# Patient Record
Sex: Female | Born: 1955 | Race: Black or African American | Hispanic: No | Marital: Married | Smoking: Former smoker
Health system: Southern US, Community
[De-identification: ages and names within clinical notes are randomized; demographics above are authoritative.]

## PROBLEM LIST (undated history)

## (undated) DIAGNOSIS — B9681 Helicobacter pylori [H. pylori] as the cause of diseases classified elsewhere: Secondary | ICD-10-CM

## (undated) DIAGNOSIS — E669 Obesity, unspecified: Secondary | ICD-10-CM

## (undated) DIAGNOSIS — D219 Benign neoplasm of connective and other soft tissue, unspecified: Secondary | ICD-10-CM

## (undated) DIAGNOSIS — K5792 Diverticulitis of intestine, part unspecified, without perforation or abscess without bleeding: Secondary | ICD-10-CM

## (undated) DIAGNOSIS — K760 Fatty (change of) liver, not elsewhere classified: Secondary | ICD-10-CM

## (undated) DIAGNOSIS — M069 Rheumatoid arthritis, unspecified: Secondary | ICD-10-CM

## (undated) DIAGNOSIS — E559 Vitamin D deficiency, unspecified: Secondary | ICD-10-CM

## (undated) DIAGNOSIS — I10 Essential (primary) hypertension: Secondary | ICD-10-CM

## (undated) DIAGNOSIS — K589 Irritable bowel syndrome without diarrhea: Secondary | ICD-10-CM

## (undated) DIAGNOSIS — M1990 Unspecified osteoarthritis, unspecified site: Secondary | ICD-10-CM

## (undated) DIAGNOSIS — M4800 Spinal stenosis, site unspecified: Secondary | ICD-10-CM

## (undated) DIAGNOSIS — K219 Gastro-esophageal reflux disease without esophagitis: Secondary | ICD-10-CM

## (undated) DIAGNOSIS — M5136 Other intervertebral disc degeneration, lumbar region: Secondary | ICD-10-CM

## (undated) DIAGNOSIS — G4730 Sleep apnea, unspecified: Secondary | ICD-10-CM

## (undated) DIAGNOSIS — M109 Gout, unspecified: Secondary | ICD-10-CM

## (undated) DIAGNOSIS — E7800 Pure hypercholesterolemia, unspecified: Secondary | ICD-10-CM

## (undated) DIAGNOSIS — D509 Iron deficiency anemia, unspecified: Secondary | ICD-10-CM

## (undated) DIAGNOSIS — F32A Depression, unspecified: Secondary | ICD-10-CM

## (undated) DIAGNOSIS — M51369 Other intervertebral disc degeneration, lumbar region without mention of lumbar back pain or lower extremity pain: Secondary | ICD-10-CM

## (undated) DIAGNOSIS — K649 Unspecified hemorrhoids: Secondary | ICD-10-CM

## (undated) DIAGNOSIS — K279 Peptic ulcer, site unspecified, unspecified as acute or chronic, without hemorrhage or perforation: Secondary | ICD-10-CM

## (undated) DIAGNOSIS — E119 Type 2 diabetes mellitus without complications: Secondary | ICD-10-CM

## (undated) DIAGNOSIS — F329 Major depressive disorder, single episode, unspecified: Secondary | ICD-10-CM

## (undated) HISTORY — DX: Diverticulitis of intestine, part unspecified, without perforation or abscess without bleeding: K57.92

## (undated) HISTORY — DX: Essential (primary) hypertension: I10

## (undated) HISTORY — DX: Gout, unspecified: M10.9

## (undated) HISTORY — DX: Unspecified hemorrhoids: K64.9

## (undated) HISTORY — DX: Depression, unspecified: F32.A

## (undated) HISTORY — PX: SPINE SURGERY: SHX786

## (undated) HISTORY — DX: Spinal stenosis, site unspecified: M48.00

## (undated) HISTORY — DX: Major depressive disorder, single episode, unspecified: F32.9

## (undated) HISTORY — DX: Gastro-esophageal reflux disease without esophagitis: K21.9

## (undated) HISTORY — DX: Pure hypercholesterolemia, unspecified: E78.00

## (undated) HISTORY — DX: Type 2 diabetes mellitus without complications: E11.9

## (undated) HISTORY — DX: Irritable bowel syndrome, unspecified: K58.9

## (undated) HISTORY — DX: Fatty (change of) liver, not elsewhere classified: K76.0

## (undated) HISTORY — PX: BACK SURGERY: SHX140

## (undated) HISTORY — PX: FOOT SURGERY: SHX648

## (undated) HISTORY — PX: SHOULDER SURGERY: SHX246

## (undated) HISTORY — PX: ABDOMINAL HYSTERECTOMY: SHX81

## (undated) HISTORY — DX: Other intervertebral disc degeneration, lumbar region without mention of lumbar back pain or lower extremity pain: M51.369

## (undated) HISTORY — DX: Other intervertebral disc degeneration, lumbar region: M51.36

## (undated) HISTORY — PX: OTHER SURGICAL HISTORY: SHX169

## (undated) HISTORY — DX: Benign neoplasm of connective and other soft tissue, unspecified: D21.9

## (undated) HISTORY — PX: NECK SURGERY: SHX720

## (undated) HISTORY — PX: ECTOPIC PREGNANCY SURGERY: SHX613

## (undated) HISTORY — PX: CHOLECYSTECTOMY: SHX55

## (undated) HISTORY — PX: ROTATOR CUFF REPAIR: SHX139

---

## 2003-03-22 ENCOUNTER — Other Ambulatory Visit: Payer: Self-pay

## 2003-06-05 ENCOUNTER — Ambulatory Visit (HOSPITAL_COMMUNITY): Admission: RE | Admit: 2003-06-05 | Discharge: 2003-06-06 | Payer: Self-pay | Admitting: Neurosurgery

## 2003-07-01 ENCOUNTER — Encounter: Admission: RE | Admit: 2003-07-01 | Discharge: 2003-07-01 | Payer: Self-pay | Admitting: Neurosurgery

## 2004-02-15 ENCOUNTER — Emergency Department: Payer: Self-pay | Admitting: Internal Medicine

## 2004-02-22 ENCOUNTER — Ambulatory Visit: Payer: Self-pay | Admitting: Pain Medicine

## 2004-05-25 ENCOUNTER — Ambulatory Visit: Payer: Self-pay | Admitting: Otolaryngology

## 2004-06-02 ENCOUNTER — Emergency Department: Payer: Self-pay | Admitting: Unknown Physician Specialty

## 2004-08-19 ENCOUNTER — Ambulatory Visit: Payer: Self-pay

## 2004-09-05 ENCOUNTER — Ambulatory Visit: Payer: Self-pay

## 2004-12-09 ENCOUNTER — Ambulatory Visit: Payer: Self-pay | Admitting: Internal Medicine

## 2005-03-14 ENCOUNTER — Emergency Department: Payer: Self-pay | Admitting: Emergency Medicine

## 2005-04-13 ENCOUNTER — Ambulatory Visit: Payer: Self-pay

## 2005-04-23 ENCOUNTER — Other Ambulatory Visit: Payer: Self-pay

## 2005-04-23 ENCOUNTER — Emergency Department: Payer: Self-pay | Admitting: General Practice

## 2005-04-24 ENCOUNTER — Ambulatory Visit: Payer: Self-pay | Admitting: General Practice

## 2005-04-25 ENCOUNTER — Inpatient Hospital Stay: Payer: Self-pay | Admitting: Unknown Physician Specialty

## 2005-07-14 ENCOUNTER — Ambulatory Visit: Payer: Self-pay | Admitting: Unknown Physician Specialty

## 2005-10-16 ENCOUNTER — Other Ambulatory Visit: Payer: Self-pay

## 2005-10-16 ENCOUNTER — Emergency Department: Payer: Self-pay | Admitting: Unknown Physician Specialty

## 2005-10-27 ENCOUNTER — Ambulatory Visit: Payer: Self-pay | Admitting: Unknown Physician Specialty

## 2005-11-08 ENCOUNTER — Encounter: Payer: Self-pay | Admitting: Specialist

## 2005-11-12 ENCOUNTER — Encounter: Payer: Self-pay | Admitting: Specialist

## 2005-12-13 ENCOUNTER — Encounter: Payer: Self-pay | Admitting: Specialist

## 2006-03-19 ENCOUNTER — Emergency Department: Payer: Self-pay | Admitting: General Practice

## 2006-03-26 ENCOUNTER — Ambulatory Visit: Payer: Self-pay | Admitting: Internal Medicine

## 2006-03-30 ENCOUNTER — Ambulatory Visit: Payer: Self-pay | Admitting: Internal Medicine

## 2006-04-02 ENCOUNTER — Ambulatory Visit: Payer: Self-pay | Admitting: Internal Medicine

## 2006-04-05 ENCOUNTER — Emergency Department: Payer: Self-pay | Admitting: Emergency Medicine

## 2006-04-26 ENCOUNTER — Ambulatory Visit: Payer: Self-pay

## 2006-06-25 ENCOUNTER — Ambulatory Visit: Payer: Self-pay | Admitting: Specialist

## 2006-09-05 ENCOUNTER — Ambulatory Visit: Payer: Self-pay | Admitting: Internal Medicine

## 2006-10-04 ENCOUNTER — Emergency Department: Payer: Self-pay | Admitting: Emergency Medicine

## 2006-11-20 ENCOUNTER — Ambulatory Visit: Payer: Self-pay | Admitting: Pain Medicine

## 2006-11-26 ENCOUNTER — Ambulatory Visit: Payer: Self-pay | Admitting: Pain Medicine

## 2006-12-06 ENCOUNTER — Ambulatory Visit: Payer: Self-pay | Admitting: Specialist

## 2006-12-31 ENCOUNTER — Encounter: Admission: RE | Admit: 2006-12-31 | Discharge: 2006-12-31 | Payer: Self-pay | Admitting: Neurosurgery

## 2007-01-11 ENCOUNTER — Ambulatory Visit: Payer: Self-pay | Admitting: Specialist

## 2007-01-17 ENCOUNTER — Ambulatory Visit: Payer: Self-pay | Admitting: Specialist

## 2007-02-12 ENCOUNTER — Encounter: Payer: Self-pay | Admitting: Specialist

## 2007-02-13 ENCOUNTER — Encounter: Payer: Self-pay | Admitting: Specialist

## 2007-03-13 ENCOUNTER — Other Ambulatory Visit: Payer: Self-pay

## 2007-03-13 ENCOUNTER — Emergency Department: Payer: Self-pay

## 2007-03-16 ENCOUNTER — Encounter: Payer: Self-pay | Admitting: Specialist

## 2007-03-21 ENCOUNTER — Ambulatory Visit: Payer: Self-pay | Admitting: Orthopedic Surgery

## 2007-04-15 ENCOUNTER — Encounter: Payer: Self-pay | Admitting: Specialist

## 2007-07-12 ENCOUNTER — Ambulatory Visit: Payer: Self-pay | Admitting: Unknown Physician Specialty

## 2009-06-25 ENCOUNTER — Encounter: Payer: Self-pay | Admitting: Orthopedic Surgery

## 2009-07-09 ENCOUNTER — Ambulatory Visit: Payer: Self-pay | Admitting: Family Medicine

## 2009-07-13 ENCOUNTER — Encounter: Payer: Self-pay | Admitting: Orthopedic Surgery

## 2009-08-13 ENCOUNTER — Encounter: Payer: Self-pay | Admitting: Orthopedic Surgery

## 2010-01-13 ENCOUNTER — Ambulatory Visit: Payer: Self-pay | Admitting: Specialist

## 2010-01-27 ENCOUNTER — Ambulatory Visit: Payer: Self-pay | Admitting: Specialist

## 2010-02-01 ENCOUNTER — Ambulatory Visit: Payer: Self-pay | Admitting: Specialist

## 2010-08-30 ENCOUNTER — Emergency Department: Payer: Self-pay | Admitting: Emergency Medicine

## 2010-09-02 ENCOUNTER — Other Ambulatory Visit: Payer: Self-pay | Admitting: Unknown Physician Specialty

## 2010-09-30 NOTE — Op Note (Signed)
Charlotte [name], Charlotte [name]                            ACCOUNT NO.:  [id number]   MEDICAL RECORD NO.:  [id number]                   PATIENT TYPE:  OIB   LOCATION:  3172                                 FACILITY:  [facility]   PHYSICIAN:  Reinaldo Meeker, M.D.              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   POSTOPERATIVE DIAGNOSIS:  Herniated disk C5-6, C6-7 with spinal cord  compression.   PROCEDURE:  C5-6 and C6-7 anterior cervical diskectomy with bone-bank fusion  followed by Vision anterior cervical plating with the operating microscope.   SURGEON:  Reinaldo Meeker, M.D.   ASSISTANT:  Donalee Citrin, M.D.   DESCRIPTION OF PROCEDURE:  After being placed in the supine position in 10  pounds of traction, the patient's neck was prepped and draped in the usual  sterile fashion.  Localizing fluoroscopy was used prior to the incision to  identify the appropriate level.  A transverse incision was made in the right  anterior neck across the midline headed towards the medial aspect of the  sternocleidomastoid muscle.  The platysma muscle was then incised  transversely.  The natural fascial plane between the strap muscles medially  and sternocleidomastoid laterally, was identified and followed down to the  anterior cervical spine.  Longus colli muscles were identified and split in  the midline and resected bilaterally with the Kitner resector, Bovie  coagulating current, and Key elevator.  Self-retaining retractor was placed  for exposure and second fluoroscopy showed approach to the appropriate  level.  Using the 15 blade, the disk at C5-6 and C6-7 was incised.  Using  pituitary rongeurs and curets approximately 90% of the disk material was  removed.  A high-speed drill was used to widen the interspace at both  levels.  The  microscope was draped and brought into the field and used the  remainder of the case.  We started at C6-7.  A high-speed drill was used to  further widen the interspace and drill down deep into the disk space.  When  the posterior longitudinal ligament was identified, it was removed in a  piecemeal fashion and thorough spinal cord decompression was carried out  along with removal of the deep spurs off C6 and C7.  Proximal foraminal  decompression was carried out bilaterally particularly on the left, more  symptomatic side.  Attention was then turned to C5-6 where a similar  procedure was carried out.  Once again, the high-speed drill was used to  widen the interspace down to the deep regions and the __________ was then  incised transversely and the cut ends removed.  A large amount of herniated  disk material was removed to decompress the underlying spinal dura.  Once  again,  thorough decompression was carried out bilaterally until the proximal  nerve roots could be identified bilaterally.  At this point inspection was  carried out in all directions at both levels for any evidence of residual  compression.  None could be identified.  Large amounts of irrigation were  carried out.  Measurements were taken and two 7-mm plugs were reconstituted.  After irrigating once more and confirming hemostasis, plugs were packed  without difficulty.  Fluoroscopy showed the plugs to be in good position.  An appropriate length Vision anterior cervical plate was then chosen.  Under  fluoroscopic guidance drill holes were placed followed by placement of 14-mm  screws times six.  Locking mechanism was then secured and fluoroscopy showed  the plate plug and screws to all be in good position.  At this time large  amounts of irrigation were carried out.  Any bleeding was controlled with  bipolar coagulation.  The wound was then closed using  interrupted Vicryl on the platysma muscle and inverted 5-0 PDS in the   subcuticular layer and Steri-Strips on the skin.  Sterile dressing and soft  collar were applied.  The patient was extubated and taken to the recovery  room in stable condition.                                               Reinaldo Meeker, M.D.    ROK/MEDQ  D:  06/05/2003  T:  06/05/2003  Job:  [id number]

## 2010-10-14 ENCOUNTER — Ambulatory Visit: Payer: Self-pay | Admitting: Unknown Physician Specialty

## 2010-10-18 LAB — PATHOLOGY REPORT

## 2011-02-14 ENCOUNTER — Ambulatory Visit: Payer: Self-pay | Admitting: Family Medicine

## 2011-07-19 DIAGNOSIS — I10 Essential (primary) hypertension: Secondary | ICD-10-CM | POA: Diagnosis not present

## 2011-07-19 DIAGNOSIS — M545 Low back pain, unspecified: Secondary | ICD-10-CM | POA: Diagnosis not present

## 2011-07-19 DIAGNOSIS — E782 Mixed hyperlipidemia: Secondary | ICD-10-CM | POA: Diagnosis not present

## 2011-07-19 DIAGNOSIS — E1149 Type 2 diabetes mellitus with other diabetic neurological complication: Secondary | ICD-10-CM | POA: Diagnosis not present

## 2011-08-01 DIAGNOSIS — I10 Essential (primary) hypertension: Secondary | ICD-10-CM | POA: Diagnosis not present

## 2011-08-01 DIAGNOSIS — E782 Mixed hyperlipidemia: Secondary | ICD-10-CM | POA: Diagnosis not present

## 2011-08-04 DIAGNOSIS — E1149 Type 2 diabetes mellitus with other diabetic neurological complication: Secondary | ICD-10-CM | POA: Diagnosis not present

## 2011-08-04 DIAGNOSIS — E782 Mixed hyperlipidemia: Secondary | ICD-10-CM | POA: Diagnosis not present

## 2011-08-04 DIAGNOSIS — J309 Allergic rhinitis, unspecified: Secondary | ICD-10-CM | POA: Diagnosis not present

## 2011-08-04 DIAGNOSIS — M545 Low back pain, unspecified: Secondary | ICD-10-CM | POA: Diagnosis not present

## 2011-09-14 DIAGNOSIS — D649 Anemia, unspecified: Secondary | ICD-10-CM | POA: Diagnosis not present

## 2011-09-14 DIAGNOSIS — D509 Iron deficiency anemia, unspecified: Secondary | ICD-10-CM | POA: Diagnosis not present

## 2011-09-14 DIAGNOSIS — R1012 Left upper quadrant pain: Secondary | ICD-10-CM | POA: Diagnosis not present

## 2011-09-14 DIAGNOSIS — R1084 Generalized abdominal pain: Secondary | ICD-10-CM | POA: Diagnosis not present

## 2011-09-14 DIAGNOSIS — R198 Other specified symptoms and signs involving the digestive system and abdomen: Secondary | ICD-10-CM | POA: Diagnosis not present

## 2011-09-14 DIAGNOSIS — K625 Hemorrhage of anus and rectum: Secondary | ICD-10-CM | POA: Diagnosis not present

## 2011-09-19 DIAGNOSIS — R198 Other specified symptoms and signs involving the digestive system and abdomen: Secondary | ICD-10-CM | POA: Diagnosis not present

## 2011-09-19 DIAGNOSIS — R1084 Generalized abdominal pain: Secondary | ICD-10-CM | POA: Diagnosis not present

## 2011-09-29 DIAGNOSIS — M25519 Pain in unspecified shoulder: Secondary | ICD-10-CM | POA: Diagnosis not present

## 2011-09-29 DIAGNOSIS — R700 Elevated erythrocyte sedimentation rate: Secondary | ICD-10-CM | POA: Diagnosis not present

## 2011-09-29 DIAGNOSIS — M2550 Pain in unspecified joint: Secondary | ICD-10-CM | POA: Diagnosis not present

## 2011-09-29 DIAGNOSIS — J4489 Other specified chronic obstructive pulmonary disease: Secondary | ICD-10-CM | POA: Diagnosis not present

## 2011-10-02 ENCOUNTER — Ambulatory Visit: Payer: Self-pay | Admitting: Unknown Physician Specialty

## 2011-10-02 DIAGNOSIS — R109 Unspecified abdominal pain: Secondary | ICD-10-CM | POA: Diagnosis not present

## 2011-10-02 DIAGNOSIS — D739 Disease of spleen, unspecified: Secondary | ICD-10-CM | POA: Diagnosis not present

## 2011-10-02 DIAGNOSIS — R197 Diarrhea, unspecified: Secondary | ICD-10-CM | POA: Diagnosis not present

## 2011-10-02 DIAGNOSIS — R195 Other fecal abnormalities: Secondary | ICD-10-CM | POA: Diagnosis not present

## 2011-10-02 DIAGNOSIS — R1013 Epigastric pain: Secondary | ICD-10-CM | POA: Diagnosis not present

## 2011-10-03 DIAGNOSIS — R933 Abnormal findings on diagnostic imaging of other parts of digestive tract: Secondary | ICD-10-CM | POA: Diagnosis not present

## 2011-10-03 DIAGNOSIS — R1013 Epigastric pain: Secondary | ICD-10-CM | POA: Diagnosis not present

## 2011-10-04 DIAGNOSIS — E119 Type 2 diabetes mellitus without complications: Secondary | ICD-10-CM | POA: Diagnosis not present

## 2011-10-06 DIAGNOSIS — K4090 Unilateral inguinal hernia, without obstruction or gangrene, not specified as recurrent: Secondary | ICD-10-CM | POA: Diagnosis not present

## 2011-10-06 DIAGNOSIS — A5909 Other urogenital trichomoniasis: Secondary | ICD-10-CM | POA: Diagnosis not present

## 2011-10-06 DIAGNOSIS — R310 Gross hematuria: Secondary | ICD-10-CM | POA: Diagnosis not present

## 2011-10-13 ENCOUNTER — Ambulatory Visit: Payer: Self-pay | Admitting: Internal Medicine

## 2011-10-13 DIAGNOSIS — R61 Generalized hyperhidrosis: Secondary | ICD-10-CM | POA: Diagnosis not present

## 2011-10-13 DIAGNOSIS — Z7982 Long term (current) use of aspirin: Secondary | ICD-10-CM | POA: Diagnosis not present

## 2011-10-13 DIAGNOSIS — I10 Essential (primary) hypertension: Secondary | ICD-10-CM | POA: Diagnosis not present

## 2011-10-13 DIAGNOSIS — E119 Type 2 diabetes mellitus without complications: Secondary | ICD-10-CM | POA: Diagnosis not present

## 2011-10-13 DIAGNOSIS — R634 Abnormal weight loss: Secondary | ICD-10-CM | POA: Diagnosis not present

## 2011-10-13 DIAGNOSIS — Z79899 Other long term (current) drug therapy: Secondary | ICD-10-CM | POA: Diagnosis not present

## 2011-10-13 DIAGNOSIS — K219 Gastro-esophageal reflux disease without esophagitis: Secondary | ICD-10-CM | POA: Diagnosis not present

## 2011-10-13 DIAGNOSIS — D739 Disease of spleen, unspecified: Secondary | ICD-10-CM | POA: Diagnosis not present

## 2011-10-13 LAB — LACTATE DEHYDROGENASE: LDH: 138 U/L (ref 84–246)

## 2011-10-13 LAB — CBC CANCER CENTER
Basophil #: 0 x10 3/mm (ref 0.0–0.1)
Basophil %: 0.4 %
Eosinophil #: 0.1 x10 3/mm (ref 0.0–0.7)
Eosinophil %: 1 %
HCT: 37.2 % (ref 35.0–47.0)
HGB: 12.2 g/dL (ref 12.0–16.0)
Lymphocyte %: 46 %
MCV: 80 fL (ref 80–100)
Monocyte #: 0.5 x10 3/mm (ref 0.2–0.9)
Monocyte %: 8 %
Neutrophil %: 44.6 %
Platelet: 354 x10 3/mm (ref 150–440)
RBC: 4.66 10*6/uL (ref 3.80–5.20)
RDW: 14.4 % (ref 11.5–14.5)

## 2011-10-13 LAB — PROTIME-INR: Prothrombin Time: 13.7 secs (ref 11.5–14.7)

## 2011-10-13 LAB — HEPATIC FUNCTION PANEL A (ARMC)
Albumin: 3.7 g/dL (ref 3.4–5.0)
Bilirubin, Direct: 0.1 mg/dL (ref 0.00–0.20)
Total Protein: 8 g/dL (ref 6.4–8.2)

## 2011-10-13 LAB — CREATININE, SERUM
Creatinine: 1.2 mg/dL (ref 0.60–1.30)
EGFR (African American): 59 — ABNORMAL LOW

## 2011-10-13 LAB — GLUCOSE, RANDOM: Glucose: 382 mg/dL — ABNORMAL HIGH (ref 65–99)

## 2011-10-13 LAB — APTT: Activated PTT: 33.1 secs (ref 23.6–35.9)

## 2011-10-14 ENCOUNTER — Ambulatory Visit: Payer: Self-pay | Admitting: Internal Medicine

## 2011-10-14 DIAGNOSIS — I10 Essential (primary) hypertension: Secondary | ICD-10-CM | POA: Diagnosis not present

## 2011-10-14 DIAGNOSIS — K219 Gastro-esophageal reflux disease without esophagitis: Secondary | ICD-10-CM | POA: Diagnosis not present

## 2011-10-14 DIAGNOSIS — Z79899 Other long term (current) drug therapy: Secondary | ICD-10-CM | POA: Diagnosis not present

## 2011-10-14 DIAGNOSIS — R61 Generalized hyperhidrosis: Secondary | ICD-10-CM | POA: Diagnosis not present

## 2011-10-14 DIAGNOSIS — E119 Type 2 diabetes mellitus without complications: Secondary | ICD-10-CM | POA: Diagnosis not present

## 2011-10-14 DIAGNOSIS — R634 Abnormal weight loss: Secondary | ICD-10-CM | POA: Diagnosis not present

## 2011-10-14 DIAGNOSIS — M899 Disorder of bone, unspecified: Secondary | ICD-10-CM | POA: Diagnosis not present

## 2011-10-14 DIAGNOSIS — Z7982 Long term (current) use of aspirin: Secondary | ICD-10-CM | POA: Diagnosis not present

## 2011-10-23 DIAGNOSIS — D1803 Hemangioma of intra-abdominal structures: Secondary | ICD-10-CM | POA: Diagnosis not present

## 2011-10-25 DIAGNOSIS — R61 Generalized hyperhidrosis: Secondary | ICD-10-CM | POA: Diagnosis not present

## 2011-10-25 DIAGNOSIS — R634 Abnormal weight loss: Secondary | ICD-10-CM | POA: Diagnosis not present

## 2011-10-25 DIAGNOSIS — M899 Disorder of bone, unspecified: Secondary | ICD-10-CM | POA: Diagnosis not present

## 2011-10-27 DIAGNOSIS — R310 Gross hematuria: Secondary | ICD-10-CM | POA: Diagnosis not present

## 2011-10-27 DIAGNOSIS — N219 Calculus of lower urinary tract, unspecified: Secondary | ICD-10-CM | POA: Diagnosis not present

## 2011-11-13 ENCOUNTER — Ambulatory Visit: Payer: Self-pay | Admitting: Internal Medicine

## 2011-12-11 DIAGNOSIS — R700 Elevated erythrocyte sedimentation rate: Secondary | ICD-10-CM | POA: Diagnosis not present

## 2011-12-11 DIAGNOSIS — M25579 Pain in unspecified ankle and joints of unspecified foot: Secondary | ICD-10-CM | POA: Diagnosis not present

## 2011-12-11 DIAGNOSIS — M159 Polyosteoarthritis, unspecified: Secondary | ICD-10-CM | POA: Diagnosis not present

## 2011-12-11 DIAGNOSIS — M064 Inflammatory polyarthropathy: Secondary | ICD-10-CM | POA: Diagnosis not present

## 2011-12-19 DIAGNOSIS — R933 Abnormal findings on diagnostic imaging of other parts of digestive tract: Secondary | ICD-10-CM | POA: Diagnosis not present

## 2012-01-01 DIAGNOSIS — M47817 Spondylosis without myelopathy or radiculopathy, lumbosacral region: Secondary | ICD-10-CM | POA: Diagnosis not present

## 2012-01-01 DIAGNOSIS — M25579 Pain in unspecified ankle and joints of unspecified foot: Secondary | ICD-10-CM | POA: Diagnosis not present

## 2012-01-01 DIAGNOSIS — M25519 Pain in unspecified shoulder: Secondary | ICD-10-CM | POA: Diagnosis not present

## 2012-01-01 DIAGNOSIS — M064 Inflammatory polyarthropathy: Secondary | ICD-10-CM | POA: Diagnosis not present

## 2012-02-08 DIAGNOSIS — IMO0001 Reserved for inherently not codable concepts without codable children: Secondary | ICD-10-CM | POA: Diagnosis not present

## 2012-02-08 DIAGNOSIS — I10 Essential (primary) hypertension: Secondary | ICD-10-CM | POA: Diagnosis not present

## 2012-02-08 DIAGNOSIS — E782 Mixed hyperlipidemia: Secondary | ICD-10-CM | POA: Diagnosis not present

## 2012-02-08 DIAGNOSIS — Z23 Encounter for immunization: Secondary | ICD-10-CM | POA: Diagnosis not present

## 2012-02-08 DIAGNOSIS — M5430 Sciatica, unspecified side: Secondary | ICD-10-CM | POA: Diagnosis not present

## 2012-02-12 DIAGNOSIS — R310 Gross hematuria: Secondary | ICD-10-CM | POA: Diagnosis not present

## 2012-02-12 DIAGNOSIS — N393 Stress incontinence (female) (male): Secondary | ICD-10-CM | POA: Diagnosis not present

## 2012-02-12 DIAGNOSIS — R351 Nocturia: Secondary | ICD-10-CM | POA: Diagnosis not present

## 2012-02-12 DIAGNOSIS — R350 Frequency of micturition: Secondary | ICD-10-CM | POA: Diagnosis not present

## 2012-02-19 DIAGNOSIS — I10 Essential (primary) hypertension: Secondary | ICD-10-CM | POA: Diagnosis not present

## 2012-02-19 DIAGNOSIS — IMO0001 Reserved for inherently not codable concepts without codable children: Secondary | ICD-10-CM | POA: Diagnosis not present

## 2012-02-19 DIAGNOSIS — Z1159 Encounter for screening for other viral diseases: Secondary | ICD-10-CM | POA: Diagnosis not present

## 2012-02-19 DIAGNOSIS — E782 Mixed hyperlipidemia: Secondary | ICD-10-CM | POA: Diagnosis not present

## 2012-02-19 DIAGNOSIS — M2550 Pain in unspecified joint: Secondary | ICD-10-CM | POA: Diagnosis not present

## 2012-02-21 DIAGNOSIS — M064 Inflammatory polyarthropathy: Secondary | ICD-10-CM | POA: Diagnosis not present

## 2012-02-21 DIAGNOSIS — G5600 Carpal tunnel syndrome, unspecified upper limb: Secondary | ICD-10-CM | POA: Diagnosis not present

## 2012-02-28 DIAGNOSIS — E669 Obesity, unspecified: Secondary | ICD-10-CM | POA: Diagnosis not present

## 2012-02-28 DIAGNOSIS — IMO0001 Reserved for inherently not codable concepts without codable children: Secondary | ICD-10-CM | POA: Diagnosis not present

## 2012-02-28 DIAGNOSIS — Z794 Long term (current) use of insulin: Secondary | ICD-10-CM | POA: Diagnosis not present

## 2012-03-04 DIAGNOSIS — E1149 Type 2 diabetes mellitus with other diabetic neurological complication: Secondary | ICD-10-CM | POA: Diagnosis not present

## 2012-03-04 DIAGNOSIS — L0291 Cutaneous abscess, unspecified: Secondary | ICD-10-CM | POA: Diagnosis not present

## 2012-03-04 DIAGNOSIS — E782 Mixed hyperlipidemia: Secondary | ICD-10-CM | POA: Diagnosis not present

## 2012-03-05 DIAGNOSIS — L0291 Cutaneous abscess, unspecified: Secondary | ICD-10-CM | POA: Diagnosis not present

## 2012-03-08 DIAGNOSIS — I8310 Varicose veins of unspecified lower extremity with inflammation: Secondary | ICD-10-CM | POA: Diagnosis not present

## 2012-03-08 DIAGNOSIS — M79609 Pain in unspecified limb: Secondary | ICD-10-CM | POA: Diagnosis not present

## 2012-03-08 DIAGNOSIS — I10 Essential (primary) hypertension: Secondary | ICD-10-CM | POA: Diagnosis not present

## 2012-03-08 DIAGNOSIS — E785 Hyperlipidemia, unspecified: Secondary | ICD-10-CM | POA: Diagnosis not present

## 2012-03-11 ENCOUNTER — Ambulatory Visit: Payer: Self-pay | Admitting: Family Medicine

## 2012-03-11 DIAGNOSIS — Z1231 Encounter for screening mammogram for malignant neoplasm of breast: Secondary | ICD-10-CM | POA: Diagnosis not present

## 2012-03-11 DIAGNOSIS — R928 Other abnormal and inconclusive findings on diagnostic imaging of breast: Secondary | ICD-10-CM | POA: Diagnosis not present

## 2012-04-02 DIAGNOSIS — Z794 Long term (current) use of insulin: Secondary | ICD-10-CM | POA: Diagnosis not present

## 2012-04-02 DIAGNOSIS — IMO0001 Reserved for inherently not codable concepts without codable children: Secondary | ICD-10-CM | POA: Diagnosis not present

## 2012-04-17 DIAGNOSIS — M279 Disease of jaws, unspecified: Secondary | ICD-10-CM | POA: Diagnosis not present

## 2012-04-19 DIAGNOSIS — Z0000 Encounter for general adult medical examination without abnormal findings: Secondary | ICD-10-CM | POA: Diagnosis not present

## 2012-05-16 DIAGNOSIS — IMO0001 Reserved for inherently not codable concepts without codable children: Secondary | ICD-10-CM | POA: Diagnosis not present

## 2012-05-22 DIAGNOSIS — Z794 Long term (current) use of insulin: Secondary | ICD-10-CM | POA: Diagnosis not present

## 2012-05-22 DIAGNOSIS — R197 Diarrhea, unspecified: Secondary | ICD-10-CM | POA: Diagnosis not present

## 2012-05-22 DIAGNOSIS — IMO0001 Reserved for inherently not codable concepts without codable children: Secondary | ICD-10-CM | POA: Diagnosis not present

## 2012-05-22 DIAGNOSIS — E669 Obesity, unspecified: Secondary | ICD-10-CM | POA: Diagnosis not present

## 2012-06-17 DIAGNOSIS — G5600 Carpal tunnel syndrome, unspecified upper limb: Secondary | ICD-10-CM | POA: Diagnosis not present

## 2012-06-17 DIAGNOSIS — M064 Inflammatory polyarthropathy: Secondary | ICD-10-CM | POA: Diagnosis not present

## 2012-06-20 DIAGNOSIS — M064 Inflammatory polyarthropathy: Secondary | ICD-10-CM | POA: Diagnosis not present

## 2012-06-20 DIAGNOSIS — M65979 Unspecified synovitis and tenosynovitis, unspecified ankle and foot: Secondary | ICD-10-CM | POA: Diagnosis not present

## 2012-07-26 ENCOUNTER — Emergency Department: Payer: Self-pay | Admitting: Emergency Medicine

## 2012-07-26 DIAGNOSIS — Z9089 Acquired absence of other organs: Secondary | ICD-10-CM | POA: Diagnosis not present

## 2012-07-26 DIAGNOSIS — F17200 Nicotine dependence, unspecified, uncomplicated: Secondary | ICD-10-CM | POA: Diagnosis not present

## 2012-07-26 DIAGNOSIS — E876 Hypokalemia: Secondary | ICD-10-CM | POA: Diagnosis not present

## 2012-07-26 DIAGNOSIS — E869 Volume depletion, unspecified: Secondary | ICD-10-CM | POA: Diagnosis not present

## 2012-07-26 DIAGNOSIS — R109 Unspecified abdominal pain: Secondary | ICD-10-CM | POA: Diagnosis not present

## 2012-07-26 DIAGNOSIS — R1110 Vomiting, unspecified: Secondary | ICD-10-CM | POA: Diagnosis not present

## 2012-07-26 LAB — URINALYSIS, COMPLETE
Bacteria: NONE SEEN
Blood: NEGATIVE
Glucose,UR: NEGATIVE mg/dL (ref 0–75)
Leukocyte Esterase: NEGATIVE
Nitrite: NEGATIVE
Protein: NEGATIVE
RBC,UR: 1 /HPF (ref 0–5)
Squamous Epithelial: 6

## 2012-07-26 LAB — COMPREHENSIVE METABOLIC PANEL
Albumin: 3.8 g/dL (ref 3.4–5.0)
Anion Gap: 7 (ref 7–16)
BUN: 11 mg/dL (ref 7–18)
Bilirubin,Total: 0.7 mg/dL (ref 0.2–1.0)
Co2: 29 mmol/L (ref 21–32)
Creatinine: 1.05 mg/dL (ref 0.60–1.30)
EGFR (African American): >60
EGFR (Non-African Amer.): 60 — ABNORMAL LOW
Glucose: 209 mg/dL — ABNORMAL HIGH (ref 65–99)
Osmolality: 276 (ref 275–301)
SGOT(AST): 31 U/L (ref 15–37)
SGPT (ALT): 34 U/L (ref 12–78)

## 2012-07-26 LAB — CBC
HCT: 40.6 % (ref 35.0–47.0)
MCHC: 32.5 g/dL (ref 32.0–36.0)
MCV: 80 fL (ref 80–100)
RDW: 14.7 % — ABNORMAL HIGH (ref 11.5–14.5)

## 2012-07-26 LAB — LIPASE, BLOOD: Lipase: 38 U/L — ABNORMAL LOW (ref 73–393)

## 2012-08-08 DIAGNOSIS — K219 Gastro-esophageal reflux disease without esophagitis: Secondary | ICD-10-CM | POA: Diagnosis not present

## 2012-08-08 DIAGNOSIS — M545 Low back pain, unspecified: Secondary | ICD-10-CM | POA: Diagnosis not present

## 2012-08-08 DIAGNOSIS — I10 Essential (primary) hypertension: Secondary | ICD-10-CM | POA: Diagnosis not present

## 2012-08-22 DIAGNOSIS — IMO0001 Reserved for inherently not codable concepts without codable children: Secondary | ICD-10-CM | POA: Diagnosis not present

## 2012-08-29 DIAGNOSIS — IMO0001 Reserved for inherently not codable concepts without codable children: Secondary | ICD-10-CM | POA: Diagnosis not present

## 2012-08-29 DIAGNOSIS — E781 Pure hyperglyceridemia: Secondary | ICD-10-CM | POA: Diagnosis not present

## 2012-08-29 DIAGNOSIS — E538 Deficiency of other specified B group vitamins: Secondary | ICD-10-CM | POA: Diagnosis not present

## 2012-08-29 DIAGNOSIS — E669 Obesity, unspecified: Secondary | ICD-10-CM | POA: Diagnosis not present

## 2012-09-19 DIAGNOSIS — G5600 Carpal tunnel syndrome, unspecified upper limb: Secondary | ICD-10-CM | POA: Diagnosis not present

## 2012-09-19 DIAGNOSIS — M545 Low back pain, unspecified: Secondary | ICD-10-CM | POA: Diagnosis not present

## 2012-09-19 DIAGNOSIS — M069 Rheumatoid arthritis, unspecified: Secondary | ICD-10-CM | POA: Diagnosis not present

## 2012-11-14 DIAGNOSIS — Z794 Long term (current) use of insulin: Secondary | ICD-10-CM | POA: Diagnosis not present

## 2012-11-14 DIAGNOSIS — IMO0001 Reserved for inherently not codable concepts without codable children: Secondary | ICD-10-CM | POA: Diagnosis not present

## 2012-11-26 DIAGNOSIS — E538 Deficiency of other specified B group vitamins: Secondary | ICD-10-CM | POA: Diagnosis not present

## 2012-11-26 DIAGNOSIS — M545 Low back pain, unspecified: Secondary | ICD-10-CM | POA: Diagnosis not present

## 2012-11-26 DIAGNOSIS — G8929 Other chronic pain: Secondary | ICD-10-CM | POA: Diagnosis not present

## 2012-11-26 DIAGNOSIS — E782 Mixed hyperlipidemia: Secondary | ICD-10-CM | POA: Diagnosis not present

## 2012-11-26 DIAGNOSIS — I10 Essential (primary) hypertension: Secondary | ICD-10-CM | POA: Diagnosis not present

## 2013-01-14 DIAGNOSIS — IMO0001 Reserved for inherently not codable concepts without codable children: Secondary | ICD-10-CM | POA: Diagnosis not present

## 2013-01-16 ENCOUNTER — Ambulatory Visit: Payer: Self-pay | Admitting: Family Medicine

## 2013-01-16 DIAGNOSIS — G8929 Other chronic pain: Secondary | ICD-10-CM | POA: Diagnosis not present

## 2013-01-16 DIAGNOSIS — R059 Cough, unspecified: Secondary | ICD-10-CM | POA: Diagnosis not present

## 2013-01-16 DIAGNOSIS — E782 Mixed hyperlipidemia: Secondary | ICD-10-CM | POA: Diagnosis not present

## 2013-01-16 DIAGNOSIS — Z23 Encounter for immunization: Secondary | ICD-10-CM | POA: Diagnosis not present

## 2013-01-16 DIAGNOSIS — M545 Low back pain, unspecified: Secondary | ICD-10-CM | POA: Diagnosis not present

## 2013-01-16 DIAGNOSIS — J4489 Other specified chronic obstructive pulmonary disease: Secondary | ICD-10-CM | POA: Diagnosis not present

## 2013-01-21 DIAGNOSIS — Z794 Long term (current) use of insulin: Secondary | ICD-10-CM | POA: Diagnosis not present

## 2013-01-21 DIAGNOSIS — IMO0001 Reserved for inherently not codable concepts without codable children: Secondary | ICD-10-CM | POA: Diagnosis not present

## 2013-03-17 DIAGNOSIS — M069 Rheumatoid arthritis, unspecified: Secondary | ICD-10-CM | POA: Diagnosis not present

## 2013-03-24 DIAGNOSIS — M67919 Unspecified disorder of synovium and tendon, unspecified shoulder: Secondary | ICD-10-CM | POA: Diagnosis not present

## 2013-03-24 DIAGNOSIS — M545 Low back pain, unspecified: Secondary | ICD-10-CM | POA: Diagnosis not present

## 2013-04-21 DIAGNOSIS — IMO0001 Reserved for inherently not codable concepts without codable children: Secondary | ICD-10-CM | POA: Diagnosis not present

## 2013-04-21 DIAGNOSIS — G8929 Other chronic pain: Secondary | ICD-10-CM | POA: Diagnosis not present

## 2013-04-21 DIAGNOSIS — M545 Low back pain, unspecified: Secondary | ICD-10-CM | POA: Diagnosis not present

## 2013-04-21 DIAGNOSIS — I10 Essential (primary) hypertension: Secondary | ICD-10-CM | POA: Diagnosis not present

## 2013-04-25 DIAGNOSIS — IMO0001 Reserved for inherently not codable concepts without codable children: Secondary | ICD-10-CM | POA: Diagnosis not present

## 2013-04-25 DIAGNOSIS — Z794 Long term (current) use of insulin: Secondary | ICD-10-CM | POA: Diagnosis not present

## 2013-06-24 ENCOUNTER — Ambulatory Visit: Payer: Self-pay | Admitting: Pain Medicine

## 2013-06-24 DIAGNOSIS — M5412 Radiculopathy, cervical region: Secondary | ICD-10-CM | POA: Diagnosis not present

## 2013-06-24 DIAGNOSIS — K219 Gastro-esophageal reflux disease without esophagitis: Secondary | ICD-10-CM | POA: Diagnosis not present

## 2013-06-24 DIAGNOSIS — M62838 Other muscle spasm: Secondary | ICD-10-CM | POA: Diagnosis not present

## 2013-06-24 DIAGNOSIS — M79609 Pain in unspecified limb: Secondary | ICD-10-CM | POA: Diagnosis not present

## 2013-06-24 DIAGNOSIS — Z9089 Acquired absence of other organs: Secondary | ICD-10-CM | POA: Diagnosis not present

## 2013-06-24 DIAGNOSIS — M47817 Spondylosis without myelopathy or radiculopathy, lumbosacral region: Secondary | ICD-10-CM | POA: Diagnosis not present

## 2013-06-24 DIAGNOSIS — I10 Essential (primary) hypertension: Secondary | ICD-10-CM | POA: Diagnosis not present

## 2013-06-24 DIAGNOSIS — Z9889 Other specified postprocedural states: Secondary | ICD-10-CM | POA: Diagnosis not present

## 2013-06-24 DIAGNOSIS — E7800 Pure hypercholesterolemia, unspecified: Secondary | ICD-10-CM | POA: Diagnosis not present

## 2013-06-24 DIAGNOSIS — K589 Irritable bowel syndrome without diarrhea: Secondary | ICD-10-CM | POA: Diagnosis not present

## 2013-06-24 DIAGNOSIS — Z7982 Long term (current) use of aspirin: Secondary | ICD-10-CM | POA: Diagnosis not present

## 2013-06-24 DIAGNOSIS — M5126 Other intervertebral disc displacement, lumbar region: Secondary | ICD-10-CM | POA: Diagnosis not present

## 2013-06-24 DIAGNOSIS — F3289 Other specified depressive episodes: Secondary | ICD-10-CM | POA: Diagnosis not present

## 2013-06-24 DIAGNOSIS — Z90710 Acquired absence of both cervix and uterus: Secondary | ICD-10-CM | POA: Diagnosis not present

## 2013-06-24 DIAGNOSIS — R209 Unspecified disturbances of skin sensation: Secondary | ICD-10-CM | POA: Diagnosis not present

## 2013-06-24 DIAGNOSIS — E1049 Type 1 diabetes mellitus with other diabetic neurological complication: Secondary | ICD-10-CM | POA: Diagnosis not present

## 2013-06-24 DIAGNOSIS — M961 Postlaminectomy syndrome, not elsewhere classified: Secondary | ICD-10-CM | POA: Diagnosis not present

## 2013-06-24 DIAGNOSIS — E119 Type 2 diabetes mellitus without complications: Secondary | ICD-10-CM | POA: Diagnosis not present

## 2013-06-30 ENCOUNTER — Ambulatory Visit: Payer: Self-pay | Admitting: Pain Medicine

## 2013-06-30 DIAGNOSIS — M48061 Spinal stenosis, lumbar region without neurogenic claudication: Secondary | ICD-10-CM | POA: Diagnosis not present

## 2013-06-30 DIAGNOSIS — M62838 Other muscle spasm: Secondary | ICD-10-CM | POA: Diagnosis not present

## 2013-06-30 DIAGNOSIS — IMO0002 Reserved for concepts with insufficient information to code with codable children: Secondary | ICD-10-CM | POA: Diagnosis not present

## 2013-06-30 DIAGNOSIS — M5126 Other intervertebral disc displacement, lumbar region: Secondary | ICD-10-CM | POA: Diagnosis not present

## 2013-07-28 DIAGNOSIS — IMO0001 Reserved for inherently not codable concepts without codable children: Secondary | ICD-10-CM | POA: Diagnosis not present

## 2013-07-28 DIAGNOSIS — I10 Essential (primary) hypertension: Secondary | ICD-10-CM | POA: Diagnosis not present

## 2013-07-28 DIAGNOSIS — E782 Mixed hyperlipidemia: Secondary | ICD-10-CM | POA: Diagnosis not present

## 2013-07-28 DIAGNOSIS — M5126 Other intervertebral disc displacement, lumbar region: Secondary | ICD-10-CM | POA: Diagnosis not present

## 2013-07-29 ENCOUNTER — Ambulatory Visit: Payer: Self-pay | Admitting: Pain Medicine

## 2013-07-29 DIAGNOSIS — Z79899 Other long term (current) drug therapy: Secondary | ICD-10-CM | POA: Diagnosis not present

## 2013-07-29 DIAGNOSIS — M47817 Spondylosis without myelopathy or radiculopathy, lumbosacral region: Secondary | ICD-10-CM | POA: Diagnosis not present

## 2013-07-29 DIAGNOSIS — M5126 Other intervertebral disc displacement, lumbar region: Secondary | ICD-10-CM | POA: Diagnosis not present

## 2013-07-29 DIAGNOSIS — M961 Postlaminectomy syndrome, not elsewhere classified: Secondary | ICD-10-CM | POA: Diagnosis not present

## 2013-07-29 DIAGNOSIS — M5412 Radiculopathy, cervical region: Secondary | ICD-10-CM | POA: Diagnosis not present

## 2013-07-29 DIAGNOSIS — IMO0001 Reserved for inherently not codable concepts without codable children: Secondary | ICD-10-CM | POA: Diagnosis not present

## 2013-07-29 DIAGNOSIS — E1049 Type 1 diabetes mellitus with other diabetic neurological complication: Secondary | ICD-10-CM | POA: Diagnosis not present

## 2013-07-29 DIAGNOSIS — M62838 Other muscle spasm: Secondary | ICD-10-CM | POA: Diagnosis not present

## 2013-08-04 DIAGNOSIS — IMO0001 Reserved for inherently not codable concepts without codable children: Secondary | ICD-10-CM | POA: Diagnosis not present

## 2013-08-04 DIAGNOSIS — Z794 Long term (current) use of insulin: Secondary | ICD-10-CM | POA: Diagnosis not present

## 2013-08-06 ENCOUNTER — Ambulatory Visit: Payer: Self-pay | Admitting: Pain Medicine

## 2013-08-06 DIAGNOSIS — Z79899 Other long term (current) drug therapy: Secondary | ICD-10-CM | POA: Diagnosis not present

## 2013-08-06 DIAGNOSIS — IMO0002 Reserved for concepts with insufficient information to code with codable children: Secondary | ICD-10-CM | POA: Diagnosis not present

## 2013-08-06 DIAGNOSIS — M5126 Other intervertebral disc displacement, lumbar region: Secondary | ICD-10-CM | POA: Diagnosis not present

## 2013-08-06 DIAGNOSIS — M51379 Other intervertebral disc degeneration, lumbosacral region without mention of lumbar back pain or lower extremity pain: Secondary | ICD-10-CM | POA: Diagnosis not present

## 2013-09-01 ENCOUNTER — Ambulatory Visit: Payer: Self-pay | Admitting: Pain Medicine

## 2013-09-01 DIAGNOSIS — M5412 Radiculopathy, cervical region: Secondary | ICD-10-CM | POA: Diagnosis not present

## 2013-09-01 DIAGNOSIS — E1049 Type 1 diabetes mellitus with other diabetic neurological complication: Secondary | ICD-10-CM | POA: Diagnosis not present

## 2013-09-01 DIAGNOSIS — Z9889 Other specified postprocedural states: Secondary | ICD-10-CM | POA: Diagnosis not present

## 2013-09-01 DIAGNOSIS — M5126 Other intervertebral disc displacement, lumbar region: Secondary | ICD-10-CM | POA: Diagnosis not present

## 2013-09-01 DIAGNOSIS — M51379 Other intervertebral disc degeneration, lumbosacral region without mention of lumbar back pain or lower extremity pain: Secondary | ICD-10-CM | POA: Diagnosis not present

## 2013-09-01 DIAGNOSIS — M961 Postlaminectomy syndrome, not elsewhere classified: Secondary | ICD-10-CM | POA: Diagnosis not present

## 2013-09-01 DIAGNOSIS — M47817 Spondylosis without myelopathy or radiculopathy, lumbosacral region: Secondary | ICD-10-CM | POA: Diagnosis not present

## 2013-09-10 ENCOUNTER — Ambulatory Visit: Payer: Self-pay | Admitting: Pain Medicine

## 2013-09-10 DIAGNOSIS — M5126 Other intervertebral disc displacement, lumbar region: Secondary | ICD-10-CM | POA: Diagnosis not present

## 2013-09-10 DIAGNOSIS — M47817 Spondylosis without myelopathy or radiculopathy, lumbosacral region: Secondary | ICD-10-CM | POA: Diagnosis not present

## 2013-09-10 DIAGNOSIS — Z79899 Other long term (current) drug therapy: Secondary | ICD-10-CM | POA: Diagnosis not present

## 2013-09-30 DIAGNOSIS — M1990 Unspecified osteoarthritis, unspecified site: Secondary | ICD-10-CM | POA: Insufficient documentation

## 2013-09-30 DIAGNOSIS — M0609 Rheumatoid arthritis without rheumatoid factor, multiple sites: Secondary | ICD-10-CM | POA: Insufficient documentation

## 2013-09-30 DIAGNOSIS — M069 Rheumatoid arthritis, unspecified: Secondary | ICD-10-CM | POA: Insufficient documentation

## 2013-10-07 ENCOUNTER — Ambulatory Visit: Payer: Self-pay | Admitting: Pain Medicine

## 2013-10-07 DIAGNOSIS — M51379 Other intervertebral disc degeneration, lumbosacral region without mention of lumbar back pain or lower extremity pain: Secondary | ICD-10-CM | POA: Diagnosis not present

## 2013-10-07 DIAGNOSIS — M1990 Unspecified osteoarthritis, unspecified site: Secondary | ICD-10-CM | POA: Diagnosis not present

## 2013-10-07 DIAGNOSIS — M461 Sacroiliitis, not elsewhere classified: Secondary | ICD-10-CM | POA: Diagnosis not present

## 2013-10-07 DIAGNOSIS — M069 Rheumatoid arthritis, unspecified: Secondary | ICD-10-CM | POA: Diagnosis not present

## 2013-10-07 DIAGNOSIS — M5030 Other cervical disc degeneration, unspecified cervical region: Secondary | ICD-10-CM | POA: Diagnosis not present

## 2013-10-07 DIAGNOSIS — M25519 Pain in unspecified shoulder: Secondary | ICD-10-CM | POA: Diagnosis not present

## 2013-10-07 DIAGNOSIS — M19019 Primary osteoarthritis, unspecified shoulder: Secondary | ICD-10-CM | POA: Diagnosis not present

## 2013-10-07 DIAGNOSIS — M5126 Other intervertebral disc displacement, lumbar region: Secondary | ICD-10-CM | POA: Diagnosis not present

## 2013-10-22 ENCOUNTER — Ambulatory Visit: Payer: Self-pay | Admitting: Pain Medicine

## 2013-10-22 DIAGNOSIS — M5030 Other cervical disc degeneration, unspecified cervical region: Secondary | ICD-10-CM | POA: Diagnosis not present

## 2013-10-22 DIAGNOSIS — M5126 Other intervertebral disc displacement, lumbar region: Secondary | ICD-10-CM | POA: Diagnosis not present

## 2013-10-22 DIAGNOSIS — M51379 Other intervertebral disc degeneration, lumbosacral region without mention of lumbar back pain or lower extremity pain: Secondary | ICD-10-CM | POA: Diagnosis not present

## 2013-10-22 DIAGNOSIS — Z79899 Other long term (current) drug therapy: Secondary | ICD-10-CM | POA: Diagnosis not present

## 2013-10-22 DIAGNOSIS — M47817 Spondylosis without myelopathy or radiculopathy, lumbosacral region: Secondary | ICD-10-CM | POA: Diagnosis not present

## 2013-10-31 DIAGNOSIS — E1142 Type 2 diabetes mellitus with diabetic polyneuropathy: Secondary | ICD-10-CM | POA: Diagnosis not present

## 2013-10-31 DIAGNOSIS — I10 Essential (primary) hypertension: Secondary | ICD-10-CM | POA: Diagnosis not present

## 2013-10-31 DIAGNOSIS — R252 Cramp and spasm: Secondary | ICD-10-CM | POA: Diagnosis not present

## 2013-10-31 DIAGNOSIS — E1149 Type 2 diabetes mellitus with other diabetic neurological complication: Secondary | ICD-10-CM | POA: Diagnosis not present

## 2013-10-31 DIAGNOSIS — E785 Hyperlipidemia, unspecified: Secondary | ICD-10-CM | POA: Diagnosis not present

## 2013-11-11 ENCOUNTER — Ambulatory Visit: Payer: Self-pay | Admitting: Pain Medicine

## 2013-11-11 DIAGNOSIS — M5126 Other intervertebral disc displacement, lumbar region: Secondary | ICD-10-CM | POA: Diagnosis not present

## 2013-11-11 DIAGNOSIS — M51379 Other intervertebral disc degeneration, lumbosacral region without mention of lumbar back pain or lower extremity pain: Secondary | ICD-10-CM | POA: Diagnosis not present

## 2013-11-11 DIAGNOSIS — IMO0002 Reserved for concepts with insufficient information to code with codable children: Secondary | ICD-10-CM | POA: Diagnosis not present

## 2013-11-11 DIAGNOSIS — M47817 Spondylosis without myelopathy or radiculopathy, lumbosacral region: Secondary | ICD-10-CM | POA: Diagnosis not present

## 2013-11-11 DIAGNOSIS — M79609 Pain in unspecified limb: Secondary | ICD-10-CM | POA: Diagnosis not present

## 2013-11-12 DIAGNOSIS — IMO0001 Reserved for inherently not codable concepts without codable children: Secondary | ICD-10-CM | POA: Diagnosis not present

## 2013-11-19 DIAGNOSIS — IMO0001 Reserved for inherently not codable concepts without codable children: Secondary | ICD-10-CM | POA: Diagnosis not present

## 2013-12-01 ENCOUNTER — Ambulatory Visit: Payer: Self-pay | Admitting: Pain Medicine

## 2013-12-01 DIAGNOSIS — E119 Type 2 diabetes mellitus without complications: Secondary | ICD-10-CM | POA: Diagnosis not present

## 2013-12-01 DIAGNOSIS — M79609 Pain in unspecified limb: Secondary | ICD-10-CM | POA: Diagnosis not present

## 2013-12-01 DIAGNOSIS — M545 Low back pain, unspecified: Secondary | ICD-10-CM | POA: Diagnosis not present

## 2013-12-01 DIAGNOSIS — M47817 Spondylosis without myelopathy or radiculopathy, lumbosacral region: Secondary | ICD-10-CM | POA: Diagnosis not present

## 2013-12-01 DIAGNOSIS — M542 Cervicalgia: Secondary | ICD-10-CM | POA: Diagnosis not present

## 2013-12-30 ENCOUNTER — Ambulatory Visit: Payer: Self-pay | Admitting: Pain Medicine

## 2013-12-30 DIAGNOSIS — M51379 Other intervertebral disc degeneration, lumbosacral region without mention of lumbar back pain or lower extremity pain: Secondary | ICD-10-CM | POA: Diagnosis not present

## 2013-12-30 DIAGNOSIS — Z79899 Other long term (current) drug therapy: Secondary | ICD-10-CM | POA: Diagnosis not present

## 2013-12-30 DIAGNOSIS — M47817 Spondylosis without myelopathy or radiculopathy, lumbosacral region: Secondary | ICD-10-CM | POA: Diagnosis not present

## 2013-12-30 DIAGNOSIS — M47812 Spondylosis without myelopathy or radiculopathy, cervical region: Secondary | ICD-10-CM | POA: Diagnosis not present

## 2013-12-30 DIAGNOSIS — M5126 Other intervertebral disc displacement, lumbar region: Secondary | ICD-10-CM | POA: Diagnosis not present

## 2013-12-30 DIAGNOSIS — E1142 Type 2 diabetes mellitus with diabetic polyneuropathy: Secondary | ICD-10-CM | POA: Diagnosis not present

## 2013-12-30 DIAGNOSIS — M461 Sacroiliitis, not elsewhere classified: Secondary | ICD-10-CM | POA: Diagnosis not present

## 2013-12-30 DIAGNOSIS — M79609 Pain in unspecified limb: Secondary | ICD-10-CM | POA: Diagnosis not present

## 2013-12-30 DIAGNOSIS — E1149 Type 2 diabetes mellitus with other diabetic neurological complication: Secondary | ICD-10-CM | POA: Diagnosis not present

## 2013-12-30 DIAGNOSIS — M5030 Other cervical disc degeneration, unspecified cervical region: Secondary | ICD-10-CM | POA: Diagnosis not present

## 2013-12-30 DIAGNOSIS — IMO0002 Reserved for concepts with insufficient information to code with codable children: Secondary | ICD-10-CM | POA: Diagnosis not present

## 2014-01-07 ENCOUNTER — Ambulatory Visit: Payer: Self-pay | Admitting: Pain Medicine

## 2014-01-07 DIAGNOSIS — M48061 Spinal stenosis, lumbar region without neurogenic claudication: Secondary | ICD-10-CM | POA: Diagnosis not present

## 2014-01-07 DIAGNOSIS — M47817 Spondylosis without myelopathy or radiculopathy, lumbosacral region: Secondary | ICD-10-CM | POA: Diagnosis not present

## 2014-01-07 DIAGNOSIS — M47812 Spondylosis without myelopathy or radiculopathy, cervical region: Secondary | ICD-10-CM | POA: Diagnosis not present

## 2014-01-07 DIAGNOSIS — IMO0002 Reserved for concepts with insufficient information to code with codable children: Secondary | ICD-10-CM | POA: Diagnosis not present

## 2014-01-27 ENCOUNTER — Ambulatory Visit: Payer: Self-pay | Admitting: Pain Medicine

## 2014-01-27 DIAGNOSIS — M48061 Spinal stenosis, lumbar region without neurogenic claudication: Secondary | ICD-10-CM | POA: Diagnosis not present

## 2014-01-27 DIAGNOSIS — M19019 Primary osteoarthritis, unspecified shoulder: Secondary | ICD-10-CM | POA: Diagnosis not present

## 2014-01-27 DIAGNOSIS — M62838 Other muscle spasm: Secondary | ICD-10-CM | POA: Diagnosis not present

## 2014-01-27 DIAGNOSIS — IMO0002 Reserved for concepts with insufficient information to code with codable children: Secondary | ICD-10-CM | POA: Diagnosis not present

## 2014-01-27 DIAGNOSIS — M47817 Spondylosis without myelopathy or radiculopathy, lumbosacral region: Secondary | ICD-10-CM | POA: Diagnosis not present

## 2014-01-27 DIAGNOSIS — E1049 Type 1 diabetes mellitus with other diabetic neurological complication: Secondary | ICD-10-CM | POA: Diagnosis not present

## 2014-01-30 DIAGNOSIS — E1149 Type 2 diabetes mellitus with other diabetic neurological complication: Secondary | ICD-10-CM | POA: Diagnosis not present

## 2014-01-30 DIAGNOSIS — M549 Dorsalgia, unspecified: Secondary | ICD-10-CM | POA: Diagnosis not present

## 2014-01-30 DIAGNOSIS — G909 Disorder of the autonomic nervous system, unspecified: Secondary | ICD-10-CM | POA: Diagnosis not present

## 2014-02-02 DIAGNOSIS — M545 Low back pain, unspecified: Secondary | ICD-10-CM | POA: Diagnosis not present

## 2014-02-02 DIAGNOSIS — E538 Deficiency of other specified B group vitamins: Secondary | ICD-10-CM | POA: Diagnosis not present

## 2014-02-02 DIAGNOSIS — E785 Hyperlipidemia, unspecified: Secondary | ICD-10-CM | POA: Diagnosis not present

## 2014-02-02 DIAGNOSIS — J111 Influenza due to unidentified influenza virus with other respiratory manifestations: Secondary | ICD-10-CM | POA: Diagnosis not present

## 2014-02-02 DIAGNOSIS — I10 Essential (primary) hypertension: Secondary | ICD-10-CM | POA: Diagnosis not present

## 2014-02-02 DIAGNOSIS — E1142 Type 2 diabetes mellitus with diabetic polyneuropathy: Secondary | ICD-10-CM | POA: Diagnosis not present

## 2014-02-02 DIAGNOSIS — G8929 Other chronic pain: Secondary | ICD-10-CM | POA: Diagnosis not present

## 2014-02-02 DIAGNOSIS — E1149 Type 2 diabetes mellitus with other diabetic neurological complication: Secondary | ICD-10-CM | POA: Diagnosis not present

## 2014-02-10 DIAGNOSIS — I10 Essential (primary) hypertension: Secondary | ICD-10-CM | POA: Diagnosis not present

## 2014-02-10 DIAGNOSIS — E538 Deficiency of other specified B group vitamins: Secondary | ICD-10-CM | POA: Diagnosis not present

## 2014-02-10 DIAGNOSIS — E785 Hyperlipidemia, unspecified: Secondary | ICD-10-CM | POA: Diagnosis not present

## 2014-02-23 ENCOUNTER — Ambulatory Visit: Payer: Self-pay | Admitting: Pain Medicine

## 2014-02-23 DIAGNOSIS — M19011 Primary osteoarthritis, right shoulder: Secondary | ICD-10-CM | POA: Diagnosis not present

## 2014-02-23 DIAGNOSIS — M7551 Bursitis of right shoulder: Secondary | ICD-10-CM | POA: Diagnosis not present

## 2014-03-24 ENCOUNTER — Ambulatory Visit: Payer: Self-pay | Admitting: Pain Medicine

## 2014-03-24 DIAGNOSIS — M5137 Other intervertebral disc degeneration, lumbosacral region: Secondary | ICD-10-CM | POA: Diagnosis not present

## 2014-03-24 DIAGNOSIS — M5126 Other intervertebral disc displacement, lumbar region: Secondary | ICD-10-CM | POA: Diagnosis not present

## 2014-03-24 DIAGNOSIS — M19019 Primary osteoarthritis, unspecified shoulder: Secondary | ICD-10-CM | POA: Diagnosis not present

## 2014-03-24 DIAGNOSIS — M533 Sacrococcygeal disorders, not elsewhere classified: Secondary | ICD-10-CM | POA: Diagnosis not present

## 2014-03-24 DIAGNOSIS — M47817 Spondylosis without myelopathy or radiculopathy, lumbosacral region: Secondary | ICD-10-CM | POA: Diagnosis not present

## 2014-03-24 DIAGNOSIS — M19011 Primary osteoarthritis, right shoulder: Secondary | ICD-10-CM | POA: Diagnosis not present

## 2014-03-24 DIAGNOSIS — M5416 Radiculopathy, lumbar region: Secondary | ICD-10-CM | POA: Diagnosis not present

## 2014-03-24 DIAGNOSIS — M4806 Spinal stenosis, lumbar region: Secondary | ICD-10-CM | POA: Diagnosis not present

## 2014-04-01 ENCOUNTER — Ambulatory Visit: Payer: Self-pay | Admitting: Pain Medicine

## 2014-04-01 DIAGNOSIS — M5126 Other intervertebral disc displacement, lumbar region: Secondary | ICD-10-CM | POA: Diagnosis not present

## 2014-04-01 DIAGNOSIS — M5416 Radiculopathy, lumbar region: Secondary | ICD-10-CM | POA: Diagnosis not present

## 2014-04-01 DIAGNOSIS — M19011 Primary osteoarthritis, right shoulder: Secondary | ICD-10-CM | POA: Diagnosis not present

## 2014-04-01 DIAGNOSIS — M47817 Spondylosis without myelopathy or radiculopathy, lumbosacral region: Secondary | ICD-10-CM | POA: Diagnosis not present

## 2014-04-01 DIAGNOSIS — M533 Sacrococcygeal disorders, not elsewhere classified: Secondary | ICD-10-CM | POA: Diagnosis not present

## 2014-04-01 DIAGNOSIS — M5137 Other intervertebral disc degeneration, lumbosacral region: Secondary | ICD-10-CM | POA: Diagnosis not present

## 2014-04-06 DIAGNOSIS — M7542 Impingement syndrome of left shoulder: Secondary | ICD-10-CM | POA: Diagnosis not present

## 2014-04-06 DIAGNOSIS — M7541 Impingement syndrome of right shoulder: Secondary | ICD-10-CM | POA: Diagnosis not present

## 2014-04-06 DIAGNOSIS — G5601 Carpal tunnel syndrome, right upper limb: Secondary | ICD-10-CM | POA: Diagnosis not present

## 2014-04-14 DIAGNOSIS — G5601 Carpal tunnel syndrome, right upper limb: Secondary | ICD-10-CM | POA: Diagnosis not present

## 2014-04-17 DIAGNOSIS — K219 Gastro-esophageal reflux disease without esophagitis: Secondary | ICD-10-CM | POA: Diagnosis not present

## 2014-04-17 DIAGNOSIS — Z23 Encounter for immunization: Secondary | ICD-10-CM | POA: Diagnosis not present

## 2014-04-17 DIAGNOSIS — G8929 Other chronic pain: Secondary | ICD-10-CM | POA: Diagnosis not present

## 2014-04-17 DIAGNOSIS — M545 Low back pain: Secondary | ICD-10-CM | POA: Diagnosis not present

## 2014-04-17 DIAGNOSIS — E1140 Type 2 diabetes mellitus with diabetic neuropathy, unspecified: Secondary | ICD-10-CM | POA: Diagnosis not present

## 2014-04-17 DIAGNOSIS — J069 Acute upper respiratory infection, unspecified: Secondary | ICD-10-CM | POA: Diagnosis not present

## 2014-04-17 DIAGNOSIS — E785 Hyperlipidemia, unspecified: Secondary | ICD-10-CM | POA: Diagnosis not present

## 2014-04-17 DIAGNOSIS — I10 Essential (primary) hypertension: Secondary | ICD-10-CM | POA: Diagnosis not present

## 2014-04-23 ENCOUNTER — Ambulatory Visit: Payer: Self-pay | Admitting: Specialist

## 2014-04-23 ENCOUNTER — Ambulatory Visit: Payer: Self-pay | Admitting: Pain Medicine

## 2014-04-23 DIAGNOSIS — E1340 Other specified diabetes mellitus with diabetic neuropathy, unspecified: Secondary | ICD-10-CM | POA: Diagnosis not present

## 2014-04-23 DIAGNOSIS — R531 Weakness: Secondary | ICD-10-CM | POA: Diagnosis not present

## 2014-04-23 DIAGNOSIS — M75102 Unspecified rotator cuff tear or rupture of left shoulder, not specified as traumatic: Secondary | ICD-10-CM | POA: Diagnosis not present

## 2014-04-23 DIAGNOSIS — M7582 Other shoulder lesions, left shoulder: Secondary | ICD-10-CM | POA: Diagnosis not present

## 2014-04-23 DIAGNOSIS — M7531 Calcific tendinitis of right shoulder: Secondary | ICD-10-CM | POA: Diagnosis not present

## 2014-04-23 DIAGNOSIS — S46919A Strain of unspecified muscle, fascia and tendon at shoulder and upper arm level, unspecified arm, initial encounter: Secondary | ICD-10-CM | POA: Diagnosis not present

## 2014-04-23 DIAGNOSIS — M792 Neuralgia and neuritis, unspecified: Secondary | ICD-10-CM | POA: Diagnosis not present

## 2014-04-23 DIAGNOSIS — M7522 Bicipital tendinitis, left shoulder: Secondary | ICD-10-CM | POA: Diagnosis not present

## 2014-04-23 DIAGNOSIS — S46812A Strain of other muscles, fascia and tendons at shoulder and upper arm level, left arm, initial encounter: Secondary | ICD-10-CM | POA: Diagnosis not present

## 2014-04-23 DIAGNOSIS — Z9889 Other specified postprocedural states: Secondary | ICD-10-CM | POA: Diagnosis not present

## 2014-04-23 DIAGNOSIS — M5416 Radiculopathy, lumbar region: Secondary | ICD-10-CM | POA: Diagnosis not present

## 2014-04-23 DIAGNOSIS — M47817 Spondylosis without myelopathy or radiculopathy, lumbosacral region: Secondary | ICD-10-CM | POA: Diagnosis not present

## 2014-05-01 DIAGNOSIS — E1165 Type 2 diabetes mellitus with hyperglycemia: Secondary | ICD-10-CM | POA: Diagnosis not present

## 2014-05-01 DIAGNOSIS — Z9114 Patient's other noncompliance with medication regimen: Secondary | ICD-10-CM | POA: Diagnosis not present

## 2014-05-01 DIAGNOSIS — Z794 Long term (current) use of insulin: Secondary | ICD-10-CM | POA: Diagnosis not present

## 2014-05-15 DIAGNOSIS — E785 Hyperlipidemia, unspecified: Secondary | ICD-10-CM

## 2014-05-15 DIAGNOSIS — I83813 Varicose veins of bilateral lower extremities with pain: Secondary | ICD-10-CM

## 2014-05-15 DIAGNOSIS — M5030 Other cervical disc degeneration, unspecified cervical region: Secondary | ICD-10-CM

## 2014-05-15 DIAGNOSIS — E1142 Type 2 diabetes mellitus with diabetic polyneuropathy: Secondary | ICD-10-CM

## 2014-05-15 DIAGNOSIS — J309 Allergic rhinitis, unspecified: Secondary | ICD-10-CM

## 2014-05-15 HISTORY — DX: Allergic rhinitis, unspecified: J30.9

## 2014-05-15 HISTORY — DX: Other cervical disc degeneration, unspecified cervical region: M50.30

## 2014-05-15 HISTORY — DX: Type 2 diabetes mellitus with diabetic polyneuropathy: E11.42

## 2014-05-15 HISTORY — DX: Varicose veins of bilateral lower extremities with pain: I83.813

## 2014-05-15 HISTORY — DX: Hyperlipidemia, unspecified: E78.5

## 2014-05-20 ENCOUNTER — Ambulatory Visit: Payer: Self-pay | Admitting: Pain Medicine

## 2014-05-20 DIAGNOSIS — M5416 Radiculopathy, lumbar region: Secondary | ICD-10-CM | POA: Diagnosis not present

## 2014-05-20 DIAGNOSIS — M47896 Other spondylosis, lumbar region: Secondary | ICD-10-CM | POA: Diagnosis not present

## 2014-05-20 DIAGNOSIS — M5126 Other intervertebral disc displacement, lumbar region: Secondary | ICD-10-CM | POA: Diagnosis not present

## 2014-05-20 DIAGNOSIS — M4806 Spinal stenosis, lumbar region: Secondary | ICD-10-CM | POA: Diagnosis not present

## 2014-05-20 DIAGNOSIS — E119 Type 2 diabetes mellitus without complications: Secondary | ICD-10-CM | POA: Diagnosis not present

## 2014-05-22 DIAGNOSIS — M7541 Impingement syndrome of right shoulder: Secondary | ICD-10-CM | POA: Diagnosis not present

## 2014-05-22 DIAGNOSIS — M75122 Complete rotator cuff tear or rupture of left shoulder, not specified as traumatic: Secondary | ICD-10-CM | POA: Diagnosis not present

## 2014-06-16 ENCOUNTER — Ambulatory Visit: Payer: Self-pay | Admitting: Pain Medicine

## 2014-06-16 DIAGNOSIS — K219 Gastro-esophageal reflux disease without esophagitis: Secondary | ICD-10-CM | POA: Diagnosis not present

## 2014-06-16 DIAGNOSIS — F329 Major depressive disorder, single episode, unspecified: Secondary | ICD-10-CM | POA: Diagnosis not present

## 2014-06-16 DIAGNOSIS — M792 Neuralgia and neuritis, unspecified: Secondary | ICD-10-CM | POA: Diagnosis not present

## 2014-06-16 DIAGNOSIS — I10 Essential (primary) hypertension: Secondary | ICD-10-CM | POA: Diagnosis not present

## 2014-06-16 DIAGNOSIS — M461 Sacroiliitis, not elsewhere classified: Secondary | ICD-10-CM | POA: Diagnosis not present

## 2014-06-16 DIAGNOSIS — M546 Pain in thoracic spine: Secondary | ICD-10-CM | POA: Diagnosis not present

## 2014-06-16 DIAGNOSIS — M25512 Pain in left shoulder: Secondary | ICD-10-CM | POA: Diagnosis not present

## 2014-06-16 DIAGNOSIS — E119 Type 2 diabetes mellitus without complications: Secondary | ICD-10-CM | POA: Diagnosis not present

## 2014-06-16 DIAGNOSIS — E780 Pure hypercholesterolemia: Secondary | ICD-10-CM | POA: Diagnosis not present

## 2014-06-16 DIAGNOSIS — M5416 Radiculopathy, lumbar region: Secondary | ICD-10-CM | POA: Diagnosis not present

## 2014-06-16 DIAGNOSIS — E1340 Other specified diabetes mellitus with diabetic neuropathy, unspecified: Secondary | ICD-10-CM | POA: Diagnosis not present

## 2014-06-16 DIAGNOSIS — M25511 Pain in right shoulder: Secondary | ICD-10-CM | POA: Diagnosis not present

## 2014-07-09 DIAGNOSIS — E1165 Type 2 diabetes mellitus with hyperglycemia: Secondary | ICD-10-CM | POA: Diagnosis not present

## 2014-07-14 DIAGNOSIS — IMO0002 Reserved for concepts with insufficient information to code with codable children: Secondary | ICD-10-CM | POA: Insufficient documentation

## 2014-07-14 DIAGNOSIS — E1129 Type 2 diabetes mellitus with other diabetic kidney complication: Secondary | ICD-10-CM | POA: Insufficient documentation

## 2014-07-14 DIAGNOSIS — E1165 Type 2 diabetes mellitus with hyperglycemia: Secondary | ICD-10-CM | POA: Diagnosis not present

## 2014-07-14 DIAGNOSIS — Z794 Long term (current) use of insulin: Secondary | ICD-10-CM | POA: Diagnosis not present

## 2014-07-20 ENCOUNTER — Ambulatory Visit: Payer: Self-pay | Admitting: Pain Medicine

## 2014-07-20 DIAGNOSIS — E1340 Other specified diabetes mellitus with diabetic neuropathy, unspecified: Secondary | ICD-10-CM | POA: Diagnosis not present

## 2014-07-20 DIAGNOSIS — E780 Pure hypercholesterolemia: Secondary | ICD-10-CM | POA: Diagnosis not present

## 2014-07-20 DIAGNOSIS — M5416 Radiculopathy, lumbar region: Secondary | ICD-10-CM | POA: Diagnosis not present

## 2014-07-20 DIAGNOSIS — M25512 Pain in left shoulder: Secondary | ICD-10-CM | POA: Diagnosis not present

## 2014-07-20 DIAGNOSIS — M461 Sacroiliitis, not elsewhere classified: Secondary | ICD-10-CM | POA: Diagnosis not present

## 2014-07-20 DIAGNOSIS — F329 Major depressive disorder, single episode, unspecified: Secondary | ICD-10-CM | POA: Diagnosis not present

## 2014-07-20 DIAGNOSIS — E119 Type 2 diabetes mellitus without complications: Secondary | ICD-10-CM | POA: Diagnosis not present

## 2014-07-20 DIAGNOSIS — M792 Neuralgia and neuritis, unspecified: Secondary | ICD-10-CM | POA: Diagnosis not present

## 2014-07-20 DIAGNOSIS — I10 Essential (primary) hypertension: Secondary | ICD-10-CM | POA: Diagnosis not present

## 2014-07-20 DIAGNOSIS — M546 Pain in thoracic spine: Secondary | ICD-10-CM | POA: Diagnosis not present

## 2014-07-20 DIAGNOSIS — K219 Gastro-esophageal reflux disease without esophagitis: Secondary | ICD-10-CM | POA: Diagnosis not present

## 2014-07-20 DIAGNOSIS — M25511 Pain in right shoulder: Secondary | ICD-10-CM | POA: Diagnosis not present

## 2014-08-06 DIAGNOSIS — Z9181 History of falling: Secondary | ICD-10-CM | POA: Diagnosis not present

## 2014-08-06 DIAGNOSIS — I8393 Asymptomatic varicose veins of bilateral lower extremities: Secondary | ICD-10-CM | POA: Diagnosis not present

## 2014-08-06 DIAGNOSIS — Z09 Encounter for follow-up examination after completed treatment for conditions other than malignant neoplasm: Secondary | ICD-10-CM | POA: Diagnosis not present

## 2014-08-06 DIAGNOSIS — E785 Hyperlipidemia, unspecified: Secondary | ICD-10-CM | POA: Diagnosis not present

## 2014-08-06 DIAGNOSIS — I10 Essential (primary) hypertension: Secondary | ICD-10-CM | POA: Diagnosis not present

## 2014-08-06 DIAGNOSIS — M75102 Unspecified rotator cuff tear or rupture of left shoulder, not specified as traumatic: Secondary | ICD-10-CM | POA: Diagnosis not present

## 2014-08-06 DIAGNOSIS — Z1389 Encounter for screening for other disorder: Secondary | ICD-10-CM | POA: Diagnosis not present

## 2014-08-06 DIAGNOSIS — Z23 Encounter for immunization: Secondary | ICD-10-CM | POA: Diagnosis not present

## 2014-08-20 ENCOUNTER — Ambulatory Visit
Admit: 2014-08-20 | Disposition: A | Discharge status: Home / Self Care | Payer: Self-pay | Attending: Pain Medicine | Admitting: Pain Medicine

## 2014-08-20 DIAGNOSIS — M461 Sacroiliitis, not elsewhere classified: Secondary | ICD-10-CM | POA: Diagnosis not present

## 2014-08-20 DIAGNOSIS — M792 Neuralgia and neuritis, unspecified: Secondary | ICD-10-CM | POA: Diagnosis not present

## 2014-08-20 DIAGNOSIS — M545 Low back pain: Secondary | ICD-10-CM | POA: Diagnosis not present

## 2014-08-20 DIAGNOSIS — M47896 Other spondylosis, lumbar region: Secondary | ICD-10-CM | POA: Diagnosis not present

## 2014-08-20 DIAGNOSIS — M19019 Primary osteoarthritis, unspecified shoulder: Secondary | ICD-10-CM | POA: Diagnosis not present

## 2014-08-20 DIAGNOSIS — M5416 Radiculopathy, lumbar region: Secondary | ICD-10-CM | POA: Diagnosis not present

## 2014-08-20 DIAGNOSIS — M4806 Spinal stenosis, lumbar region: Secondary | ICD-10-CM | POA: Diagnosis not present

## 2014-08-20 DIAGNOSIS — R209 Unspecified disturbances of skin sensation: Secondary | ICD-10-CM | POA: Diagnosis not present

## 2014-08-20 DIAGNOSIS — E1340 Other specified diabetes mellitus with diabetic neuropathy, unspecified: Secondary | ICD-10-CM | POA: Diagnosis not present

## 2014-08-29 DIAGNOSIS — J0100 Acute maxillary sinusitis, unspecified: Secondary | ICD-10-CM | POA: Diagnosis not present

## 2014-08-29 DIAGNOSIS — J069 Acute upper respiratory infection, unspecified: Secondary | ICD-10-CM | POA: Diagnosis not present

## 2014-09-11 DIAGNOSIS — K1120 Sialoadenitis, unspecified: Secondary | ICD-10-CM | POA: Diagnosis not present

## 2014-09-11 DIAGNOSIS — J309 Allergic rhinitis, unspecified: Secondary | ICD-10-CM | POA: Diagnosis not present

## 2014-09-18 DIAGNOSIS — M5030 Other cervical disc degeneration, unspecified cervical region: Secondary | ICD-10-CM | POA: Insufficient documentation

## 2014-09-18 DIAGNOSIS — M51379 Other intervertebral disc degeneration, lumbosacral region without mention of lumbar back pain or lower extremity pain: Secondary | ICD-10-CM | POA: Insufficient documentation

## 2014-09-18 DIAGNOSIS — E1340 Other specified diabetes mellitus with diabetic neuropathy, unspecified: Secondary | ICD-10-CM | POA: Insufficient documentation

## 2014-09-18 DIAGNOSIS — M1990 Unspecified osteoarthritis, unspecified site: Secondary | ICD-10-CM | POA: Insufficient documentation

## 2014-09-18 DIAGNOSIS — M51372 Other intervertebral disc degeneration, lumbosacral region with discogenic back pain and lower extremity pain: Secondary | ICD-10-CM | POA: Insufficient documentation

## 2014-09-18 DIAGNOSIS — E1142 Type 2 diabetes mellitus with diabetic polyneuropathy: Secondary | ICD-10-CM | POA: Insufficient documentation

## 2014-09-18 DIAGNOSIS — M5137 Other intervertebral disc degeneration, lumbosacral region: Secondary | ICD-10-CM | POA: Insufficient documentation

## 2014-09-18 NOTE — Patient Instructions (Addendum)
Continue present medications.  L facets as scheduled    F/U Dr  Ancil Boozer for eval BP, DM, and general condition.  F/U surgical eval of shoulder  F/U neurosurgical  Eval.  May consider radiofrequency rhizolysis, intraspinal implantation, and other treatment.  Continue present medications.  F/U PCP for evaliation of  BP and general medical  Condition.  F/U surgical evaluation.  F/U nrurological evaluation.  May consider radiofrequency rhizolysis or intraspinal procedures pending response to present treatment and F/U evaluation.  Patient to call [facility] should patient have concerns prior to scheduled return appointment.    GENERAL RISKS AND COMPLICATIONS  What are the risk, side effects and possible complications? Generally speaking, most procedures are safe.  However, with any procedure there are risks, side effects, and the possibility of complications.  The risks and complications are dependent upon the sites that are lesioned, or the type of nerve block to be performed.  The closer the procedure is to the spine, the more serious the risks are.  Great care is taken when placing the radio frequency needles, block needles or lesioning probes, but sometimes complications can occur. 1. Infection: Any time there is an injection through the skin, there is a risk of infection.  This is why sterile conditions are used for these blocks.  There are four possible types of infection. 1. Localized skin infection. 2. Central Nervous System Infection-This can be in the form of Meningitis, which can be deadly. 3. Epidural Infections-This can be in the form of an epidural abscess, which can cause pressure inside of the spine, causing compression of the spinal cord with subsequent paralysis. This would require an emergency surgery to decompress, and there are no guarantees that the patient would recover from the paralysis. 4. Discitis-This is an infection of the intervertebral discs.  It  occurs in about 1% of discography procedures.  It is difficult to treat and it may lead to surgery.        2. Pain: the needles have to go through skin and soft tissues, will cause soreness.       3. Damage to internal structures:  The nerves to be lesioned may be near blood vessels or    other nerves which can be potentially damaged.       4. Bleeding: Bleeding is more common if the patient is taking blood thinners such as  aspirin, Coumadin, Ticiid, Plavix, etc., or if he/she have some genetic predisposition  such as hemophilia. Bleeding into the spinal canal can cause compression of the spinal  cord with subsequent paralysis.  This would require an emergency surgery to  decompress and there are no guarantees that the patient would recover from the  paralysis.       5. Pneumothorax:  Puncturing of a lung is a possibility, every time a needle is introduced in  the area of the chest or upper back.  Pneumothorax refers to free air around the  collapsed lung(s), inside of the thoracic cavity (chest cavity).  Another two possible  complications related to a similar event would include: Hemothorax and Chylothorax.   These are variations of the Pneumothorax, where instead of air around the collapsed  lung(s), you may have blood or chyle, respectively. ent..      6. Spinal headaches: They may occur with any procedures in the area of the spine.       7. Persistent CSF (Cerebro-Spinal Fluid) leakage: This is a rare problem, but may occur  with prolonged  intrathecal or epidural catheters either due to the formation of a fistulous  track or a dural tear.       8. Nerve damage: By working so close to the spinal cord, there is always a possibility of  nerve damage, which could be as serious as a permanent spinal cord injury with  paralysis.       9. Death:  Although rare, severe deadly allergic reactions known as "Anaphylactic  reaction" can occur to any of the medications used.      10. Worsening of the symptoms:   We can always make thing worse.  What are the chances of something like this happening? Chances of any of this occuring are extremely low.  By statistics, you have more of a chance of getting killed in a motor vehicle accident: while driving to the hospital than any of the above occurring .  Nevertheless, you should be aware that they are possibilities.  In general, it is similar to taking a shower.  Everybody knows that you can slip, hit your head and get killed.  Does that mean that you should not shower again?  Nevertheless always keep in mind that statistics do not mean anything if you happen to be on the wrong side of them.  Even if a procedure has a 1 (one) in a 1,000,000 (million) chance of going wrong, it you happen to be that one..Also, keep in mind that by statistics, you have more of a chance of having something go wrong when taking medications.  Who should not have this procedure? If you are on a blood thinning medication (e.g. Coumadin, Plavix, see list of "Blood Thinners"), or if you have an active infection going on, you should not have the procedure.  If you are taking any blood thinners, please inform your physician.  How should I prepare for this procedure?  Do not eat or drink anything at least six hours prior to the procedure.  Bring a driver with you .  It cannot be a taxi.  Come accompanied by an adult that can drive you back, and that is strong enough to help you if your legs get weak or numb from the local anesthetic.  Take all of your medicines the morning of the procedure with just enough water to swallow them.  If you have diabetes, make sure that you are scheduled to have your procedure done first thing in the morning, whenever possible.  If you have diabetes, take only half of your insulin dose and notify our nurse that you have done so as soon as you arrive at the clinic.  If you are diabetic, but only take blood sugar pills (oral hypoglycemic), then do not take  them on the morning of your procedure.  You may take them after you have had the procedure.  Do not take aspirin or any aspirin-containing medications, at least eleven (11) days prior to the procedure.  They may prolong bleeding.  Wear loose fitting clothing that may be easy to take off and that you would not mind if it got stained with Betadine or blood.  Do not wear any jewelry or perfume  Remove any nail coloring.  It will interfere with some of our monitoring equipment.  NOTE: Remember that this is not meant to be interpreted as a complete list of all possible complications.  Unforeseen problems may occur.  BLOOD THINNERS The following drugs contain aspirin or other products, which can cause increased bleeding during surgery and should  not be taken for 2 weeks prior to and 1 week after surgery.  If you should need take something for relief of minor pain, you may take acetaminophen which is found in Tylenol,m Datril, Anacin-3 and Panadol. It is not blood thinner. The products listed below are.  Do not take any of the products listed below in addition to any listed on your instruction sheet.  A.P.C or A.P.C with Codeine Codeine Phosphate Capsules #3 Ibuprofen Ridaura  ABC compound Congesprin Imuran rimadil  Advil Cope Indocin Robaxisal  Alka-Seltzer Effervescent Pain Reliever and Antacid Coricidin or Coricidin-D  Indomethacin Rufen  Alka-Seltzer plus Cold Medicine Cosprin Ketoprofen S-A-C Tablets  Anacin Analgesic Tablets or Capsules Coumadin Korlgesic Salflex  Anacin Extra Strength Analgesic tablets or capsules CP-2 Tablets Lanoril Salicylate  Anaprox Cuprimine Capsules Levenox Salocol  Anexsia-D Dalteparin Magan Salsalate  Anodynos Darvon compound Magnesium Salicylate Sine-off  Ansaid Dasin Capsules Magsal Sodium Salicylate  Anturane Depen Capsules Marnal Soma  APF Arthritis pain formula Dewitt's Pills Measurin Stanback  Argesic Dia-Gesic Meclofenamic Sulfinpyrazone  Arthritis Bayer  Timed Release Aspirin Diclofenac Meclomen Sulindac  Arthritis pain formula Anacin Dicumarol Medipren Supac  Analgesic (Safety coated) Arthralgen Diffunasal Mefanamic Suprofen  Arthritis Strength Bufferin Dihydrocodeine Mepro Compound Suprol  Arthropan liquid Dopirydamole Methcarbomol with Aspirin Synalgos  ASA tablets/Enseals Disalcid Micrainin Tagament  Ascriptin Doan's Midol Talwin  Ascriptin A/D Dolene Mobidin Tanderil  Ascriptin Extra Strength Dolobid Moblgesic Ticlid  Ascriptin with Codeine Doloprin or Doloprin with Codeine Momentum Tolectin  Asperbuf Duoprin Mono-gesic Trendar  Aspergum Duradyne Motrin or Motrin IB Triminicin  Aspirin plain, buffered or enteric coated Durasal Myochrisine Trigesic  Aspirin Suppositories Easprin Nalfon Trillsate  Aspirin with Codeine Ecotrin Regular or Extra Strength Naprosyn Uracel  Atromid-S Efficin Naproxen Ursinus  Auranofin Capsules Elmiron Neocylate Vanquish  Axotal Emagrin Norgesic Verin  Azathioprine Empirin or Empirin with Codeine Normiflo Vitamin E  Azolid Emprazil Nuprin Voltaren  Bayer Aspirin plain, buffered or children's or timed BC Tablets or powders Encaprin Orgaran Warfarin Sodium  Buff-a-Comp Enoxaparin Orudis Zorpin  Buff-a-Comp with Codeine Equegesic Os-Cal-Gesic   Buffaprin Excedrin plain, buffered or Extra Strength Oxalid   Bufferin Arthritis Strength Feldene Oxphenbutazone   Bufferin plain or Extra Strength Feldene Capsules Oxycodone with Aspirin   Bufferin with Codeine Fenoprofen Fenoprofen Pabalate or Pabalate-SF   Buffets II Flogesic Panagesic   Buffinol plain or Extra Strength Florinal or Florinal with Codeine Panwarfarin   Buf-Tabs Flurbiprofen Penicillamine   Butalbital Compound Four-way cold tablets Penicillin   Butazolidin Fragmin Pepto-Bismol   Carbenicillin Geminisyn Percodan   Carna Arthritis Reliever Geopen Persantine   Carprofen Gold's salt Persistin   Chloramphenicol Goody's Phenylbutazone   Chloromycetin  [place]altrain Piroxlcam   Clmetidine heparin Plaquenil   Cllnoril Hyco-pap Ponstel   Clofibrate Hydroxy chloroquine Propoxyphen         Before stopping any of these medications, be sure to consult the physician who ordered them.  Some, such as Coumadin (Warfarin) are ordered to prevent or treat serious conditions such as "deep thrombosis", "pumonary embolisms", and other heart problems.  The amount of time that you may need off of the medication may also vary with the medication and the reason for which you were taking it.  If you are taking any of these medications, please make sure you notify your pain physician before you undergo any procedures.         Facet Joint Block The facet joints connect the bones of the spine (vertebrae). They make it possible for you to  bend, twist, and make other movements with your spine. They also prevent you from overbending, overtwisting, and making other excessive movements.  A facet joint block is a procedure where a numbing medicine (anesthetic) is injected into a facet joint. Often, a type of anti-inflammatory medicine called a steroid is also injected. A facet joint block may be done for two reasons:  2. Diagnosis. A facet joint block may be done as a test to see whether neck or back pain is caused by a worn-down or infected facet joint. If the pain gets better after a facet joint block, it means the pain is probably coming from the facet joint. If the pain does not get better, it means the pain is probably not coming from the facet joint.  3. Therapy. A facet joint block may be done to relieve neck or back pain caused by a facet joint. A facet joint block is only done as a therapy if the pain does not improve with medicine, exercise programs, physical therapy, and other forms of pain management. LET [facility] CARE PROVIDER KNOW ABOUT:   Any allergies you have.   All medicines you are taking, including vitamins, herbs, eyedrops, and over-the-counter  medicines and creams.   Previous problems you or members of your family have had with the use of anesthetics.   Any blood disorders you have had.   Other health problems you have. RISKS AND COMPLICATIONS Generally, having a facet joint block is safe. However, as with any procedure, complications can occur. Possible complications associated with having a facet joint block include:   Bleeding.   Injury to a nerve near the injection site.   Pain at the injection site.   Weakness or numbness in areas controlled by nerves near the injection site.   Infection.   Temporary fluid retention.   Allergic reaction to anesthetics or medicines used during the procedure. BEFORE THE PROCEDURE   Follow your health care provider's instructions if you are taking dietary supplements or medicines. You may need to stop taking them or reduce your dosage.   Do not take any new dietary supplements or medicines without asking your health care provider first.   Follow your health care provider's instructions about eating and drinking before the procedure. You may need to stop eating and drinking several hours before the procedure.   Arrange to have an adult drive you home after the procedure. PROCEDURE 12. You may need to remove your clothing and dress in an open-back gown so that your health care provider can access your spine.  13. The procedure will be done while you are lying on an X-ray table. Most of the time you will be asked to lie on your stomach, but you may be asked to lie in a different position if an injection will be made in your neck.  14. Special machines will be used to monitor your oxygen levels, heart rate, and blood pressure.  15. If an injection will be made in your neck, an intravenous (IV) tube will be inserted into one of your veins. Fluids and medicine will flow directly into your body through the IV tube.  16. The area over the facet joint where the injection will  be made will be cleaned with an antiseptic soap. The surrounding skin will be covered with sterile drapes.  17. An anesthetic will be applied to your skin to make the injection area numb. You may feel a temporary stinging or burning sensation.  18. A video  X-ray machine will be used to locate the joint. A contrast dye may be injected into the facet joint area to help with locating the joint.  19. When the joint is located, an anesthetic medicine will be injected into the joint through the needle.  42. Your health care provider will ask you whether you feel pain relief. If you do feel relief, a steroid may be injected to provide pain relief for a longer period of time. If you do not feel relief or feel only partial relief, additional injections of an anesthetic may be made in other facet joints.  21. The needle will be removed, the skin will be cleansed, and bandages will be applied.  AFTER THE PROCEDURE   You will be observed for 15-30 minutes before being allowed to go home. Do not drive. Have an adult drive you or take a taxi or public transportation instead.   If you feel pain relief, the pain will return in several hours or days when the anesthetic wears off.   You may feel pain relief 2-14 days after the procedure. The amount of time this relief lasts varies from person to person.   It is normal to feel some tenderness over the injected area(s) for 2 days following the procedure.   If you have diabetes, you may have a temporary increase in blood sugar. Document Released: 09/20/2006 Document Revised: 09/15/2013 Document Reviewed: 02/19/2012 [facility] Patient Information 2015 [place], [place]. This information is not intended to replace advice given to you by your health care provider. Make sure you discuss any questions you have with your health care provider.

## 2014-09-18 NOTE — Progress Notes (Signed)
Patient ID: Charlotte [name], female   DOB: 08-Dec-1955, 58 y.o.   MRN: [id number]

## 2014-09-21 ENCOUNTER — Ambulatory Visit: Payer: Medicare Other | Attending: Pain Medicine | Admitting: Pain Medicine

## 2014-09-21 ENCOUNTER — Encounter: Payer: Self-pay | Admitting: Pain Medicine

## 2014-09-21 VITALS — BP 145/91 | HR 80 | Temp 98.0°F | Resp 18 | Ht 66.0 in | Wt 230.0 lb

## 2014-09-21 DIAGNOSIS — M5030 Other cervical disc degeneration, unspecified cervical region: Secondary | ICD-10-CM

## 2014-09-21 DIAGNOSIS — M792 Neuralgia and neuritis, unspecified: Secondary | ICD-10-CM | POA: Diagnosis not present

## 2014-09-21 DIAGNOSIS — M5126 Other intervertebral disc displacement, lumbar region: Secondary | ICD-10-CM | POA: Insufficient documentation

## 2014-09-21 DIAGNOSIS — M461 Sacroiliitis, not elsewhere classified: Secondary | ICD-10-CM | POA: Diagnosis not present

## 2014-09-21 DIAGNOSIS — M47816 Spondylosis without myelopathy or radiculopathy, lumbar region: Secondary | ICD-10-CM | POA: Insufficient documentation

## 2014-09-21 DIAGNOSIS — M5137 Other intervertebral disc degeneration, lumbosacral region: Secondary | ICD-10-CM

## 2014-09-21 DIAGNOSIS — E1340 Other specified diabetes mellitus with diabetic neuropathy, unspecified: Secondary | ICD-10-CM

## 2014-09-21 DIAGNOSIS — M545 Low back pain: Secondary | ICD-10-CM | POA: Diagnosis present

## 2014-09-21 DIAGNOSIS — M19011 Primary osteoarthritis, right shoulder: Secondary | ICD-10-CM

## 2014-09-21 DIAGNOSIS — M4806 Spinal stenosis, lumbar region: Secondary | ICD-10-CM | POA: Diagnosis not present

## 2014-09-21 DIAGNOSIS — M19012 Primary osteoarthritis, left shoulder: Secondary | ICD-10-CM

## 2014-09-21 DIAGNOSIS — M5416 Radiculopathy, lumbar region: Secondary | ICD-10-CM | POA: Diagnosis not present

## 2014-09-21 MED ORDER — HYDROCODONE-ACETAMINOPHEN 5-325 MG PO TABS: 1.0000 | ORAL_TABLET | Freq: Two times a day (BID) | ORAL | Status: DC | PRN

## 2014-09-21 NOTE — Progress Notes (Signed)
Discharge instructions given. Teachback x3. Discharged via ambulatory at [place] am

## 2014-09-21 NOTE — Progress Notes (Signed)
   Subjective:    Patient ID: Charlotte [name], female    DOB: Sep 16, 1955, 59 y.o.   MRN: [id number]  HPI   Patient with severe lower back and lower extremity pain. Patient states the pain is aggravated by standing and walking twisting turning maneuvers wished to proceed with interventional treatment at time of return appointment patient denies trauma or change in events of daily living without significant change in symptomatology. We'll proceed with lumbar facet (medial branch nerve blocks) at time of return appointment  Review of Systems     Objective:   Physical Exam  Physical examination revealed patient to be with tenderness of the paraspinal musculature region of the cervical region with tenderness of the cervical facets. There was tenderness over the thoracic facet region. Palpation of the lumbar paraspinal musculature region reproduced severe pain with lateral bending and rotation reproducing severe pain as well. There was tenderness over the PSIS PSIS regions. Straight leg raising was tolerates approximately 30 without increase of pain with dorsiflexion. 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  Continue present medications.  LUMBAR FACETS  F/U PCP for evaliation of  BP and general medical  Condition.  F/U surgical evaluation.  F/U nrurological evaluation.  May consider radiofrequency rhizolysis or intraspinal procedures pending response to present treatment and F/U evaluation.  Patient to call [facility] Management Center should patient have concerns prior to scheduled  return appointment.

## 2014-09-21 NOTE — Progress Notes (Signed)
   Subjective:    Patient ID: Charlotte [name], female    DOB: December 29, 1955, 59 y.o.   MRN: [id number]  HPI    Review of Systems     Objective:   Physical Exam        Assessment & Plan:

## 2014-09-24 NOTE — Addendum Note (Signed)
Addended by: Dewayne Shorter on: 09/24/2014 11:19 AM   Modules accepted: Orders

## 2014-10-07 ENCOUNTER — Ambulatory Visit: Payer: Medicare Other | Admitting: Pain Medicine

## 2014-10-07 ENCOUNTER — Other Ambulatory Visit: Payer: Self-pay | Admitting: Pain Medicine

## 2014-10-09 DIAGNOSIS — E1165 Type 2 diabetes mellitus with hyperglycemia: Secondary | ICD-10-CM | POA: Diagnosis not present

## 2014-10-14 DIAGNOSIS — E1165 Type 2 diabetes mellitus with hyperglycemia: Secondary | ICD-10-CM | POA: Diagnosis not present

## 2014-10-14 DIAGNOSIS — M5440 Lumbago with sciatica, unspecified side: Secondary | ICD-10-CM | POA: Diagnosis not present

## 2014-10-14 DIAGNOSIS — Z794 Long term (current) use of insulin: Secondary | ICD-10-CM | POA: Diagnosis not present

## 2014-10-14 DIAGNOSIS — E1129 Type 2 diabetes mellitus with other diabetic kidney complication: Secondary | ICD-10-CM | POA: Diagnosis not present

## 2014-10-16 ENCOUNTER — Other Ambulatory Visit: Payer: Self-pay | Admitting: Pain Medicine

## 2014-10-16 DIAGNOSIS — M19019 Primary osteoarthritis, unspecified shoulder: Secondary | ICD-10-CM | POA: Insufficient documentation

## 2014-10-16 DIAGNOSIS — M5030 Other cervical disc degeneration, unspecified cervical region: Secondary | ICD-10-CM

## 2014-10-16 DIAGNOSIS — E1340 Other specified diabetes mellitus with diabetic neuropathy, unspecified: Secondary | ICD-10-CM

## 2014-10-16 DIAGNOSIS — M19011 Primary osteoarthritis, right shoulder: Secondary | ICD-10-CM

## 2014-10-16 DIAGNOSIS — M5137 Other intervertebral disc degeneration, lumbosacral region: Secondary | ICD-10-CM

## 2014-10-16 DIAGNOSIS — M159 Polyosteoarthritis, unspecified: Secondary | ICD-10-CM

## 2014-10-16 DIAGNOSIS — M19012 Primary osteoarthritis, left shoulder: Secondary | ICD-10-CM

## 2014-10-16 DIAGNOSIS — M150 Primary generalized (osteo)arthritis: Secondary | ICD-10-CM

## 2014-10-19 ENCOUNTER — Ambulatory Visit: Payer: Medicare Other | Attending: Pain Medicine | Admitting: Pain Medicine

## 2014-10-19 ENCOUNTER — Encounter: Payer: Self-pay | Admitting: Pain Medicine

## 2014-10-19 VITALS — BP 122/81 | HR 78 | Temp 98.1°F | Resp 16 | Ht 66.0 in | Wt 231.0 lb

## 2014-10-19 DIAGNOSIS — E1340 Other specified diabetes mellitus with diabetic neuropathy, unspecified: Secondary | ICD-10-CM | POA: Diagnosis not present

## 2014-10-19 DIAGNOSIS — M5030 Other cervical disc degeneration, unspecified cervical region: Secondary | ICD-10-CM | POA: Diagnosis not present

## 2014-10-19 DIAGNOSIS — M25512 Pain in left shoulder: Secondary | ICD-10-CM | POA: Diagnosis present

## 2014-10-19 DIAGNOSIS — M5126 Other intervertebral disc displacement, lumbar region: Secondary | ICD-10-CM | POA: Diagnosis not present

## 2014-10-19 DIAGNOSIS — M792 Neuralgia and neuritis, unspecified: Secondary | ICD-10-CM | POA: Diagnosis not present

## 2014-10-19 DIAGNOSIS — M160 Bilateral primary osteoarthritis of hip: Secondary | ICD-10-CM

## 2014-10-19 DIAGNOSIS — M5416 Radiculopathy, lumbar region: Secondary | ICD-10-CM | POA: Diagnosis not present

## 2014-10-19 DIAGNOSIS — M5136 Other intervertebral disc degeneration, lumbar region: Secondary | ICD-10-CM | POA: Diagnosis not present

## 2014-10-19 DIAGNOSIS — M19012 Primary osteoarthritis, left shoulder: Secondary | ICD-10-CM

## 2014-10-19 DIAGNOSIS — M542 Cervicalgia: Secondary | ICD-10-CM | POA: Diagnosis present

## 2014-10-19 DIAGNOSIS — M25511 Pain in right shoulder: Secondary | ICD-10-CM | POA: Diagnosis present

## 2014-10-19 DIAGNOSIS — M4806 Spinal stenosis, lumbar region: Secondary | ICD-10-CM | POA: Diagnosis not present

## 2014-10-19 DIAGNOSIS — M5137 Other intervertebral disc degeneration, lumbosacral region: Secondary | ICD-10-CM

## 2014-10-19 DIAGNOSIS — M461 Sacroiliitis, not elsewhere classified: Secondary | ICD-10-CM | POA: Diagnosis not present

## 2014-10-19 DIAGNOSIS — M19019 Primary osteoarthritis, unspecified shoulder: Secondary | ICD-10-CM | POA: Insufficient documentation

## 2014-10-19 DIAGNOSIS — M19011 Primary osteoarthritis, right shoulder: Secondary | ICD-10-CM

## 2014-10-19 DIAGNOSIS — E1140 Type 2 diabetes mellitus with diabetic neuropathy, unspecified: Secondary | ICD-10-CM | POA: Diagnosis not present

## 2014-10-19 MED ORDER — HYDROCODONE-ACETAMINOPHEN 5-325 MG PO TABS: ORAL_TABLET | ORAL | Status: DC

## 2014-10-19 NOTE — Progress Notes (Signed)
Safety precautions to be maintained throughout the outpatient stay will include: orient to surroundings, keep bed in low position, maintain call bell within reach at all times, provide assistance with transfer out of bed and ambulation.  

## 2014-10-19 NOTE — Progress Notes (Signed)
Discharge patient home ambulatory  0850 hrs Teach back done

## 2014-10-19 NOTE — Progress Notes (Signed)
   Subjective:    Patient ID: Charlotte [name], female    DOB: 1956/01/15, 59 y.o.   MRN: [id number]  HPI    Review of Systems     Objective:   Physical Exam        Assessment & Plan:

## 2014-10-19 NOTE — Patient Instructions (Signed)
Continue present medications.  F/U PCP for evaliation of  BP and general medical  condition.  F/U surgical evaluation.  F/U neurological evaluation.  May consider radiofrequency rhizolysis or intraspinal procedures pending response to present treatment and F/U evaluation.  Patient to call Pain Management Center should patient have concerns prior to scheduled return appointment.  

## 2014-10-19 NOTE — Progress Notes (Signed)
   Subjective:    Patient ID: Charlotte [name], female    DOB: 14-Nov-1955, 59 y.o.   MRN: [id number]  HPI  Patient 59 year old female returns to [facility] for further evaluation and treatment of pain involving the neck and shoulders especially the left shoulder upper mid and lower back lower extremity regions. At the present time patient continues to undergo general medical follow-up evaluation and treatment waiting until she has optimal control of her [place] and general medical condition prior to proceeding with surgical intervention of the shoulder. Patient overall condition and will continue medications as prescribed and avoid interventional treatment of patient's condition of the lower back and lower extremity region and patient will undergo follow-up surgical evaluation is planned. Patient denies trauma change in events of daily living the call significant changes of the pathology. The patient is understanding and agrees with suggested treatment plan.     Review of Systems     Objective:   Physical Exam  There was tenderness to palpation of the splenius capitis and occipitalis regions of mild to moderate degree. Patient with decreased range of motion of the left shoulder compared to the right shoulder with difficulty performing the drop test on the left compared to the right. Unremarkable Spurling's maneuver. Tinel and Phalen's maneuver associated with mild discomfort with grip strength decreased on the left compared to the right. There was tends to palpation over the thoracic facet thoracic paraspinal musculature region without crepitus of the thoracic region noted with significant muscle spasms noted and upper and mid thoracic paraspinal musculature regions. Of the lumbar paraspinal musculature region lumbar facet region associated take to palpation with moderate tends to palpation over the lumbar facets gluteal and piriformis musculature regions. Moderate tends to palpation of  the PSIS and PII S regions. Straight leg raising tolerates possibly 30 without an increase of pain with dorsiflexion noted. There was negative clonus negative Homans. No sensory deficit of dermatomal distribution detected. Abdomen soft nontender. No costovertebral tenderness noted.      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  F/U PCP for evaliation of  BP and general medical  condition.  F/U surgical evaluation of shoulder as planned with consideration being given to surgical intervention  F/U neurological evaluation.  May consider radiofrequency rhizolysis or intraspinal procedures pending response to present treatment and F/U evaluation.  Patient to call [facility] should patient have concerns prior to scheduled return appointment.

## 2014-10-28 ENCOUNTER — Other Ambulatory Visit: Payer: Self-pay | Admitting: Family Medicine

## 2014-10-29 ENCOUNTER — Other Ambulatory Visit: Payer: Self-pay | Admitting: Pain Medicine

## 2014-11-03 ENCOUNTER — Encounter: Payer: Self-pay | Admitting: Family Medicine

## 2014-11-05 ENCOUNTER — Encounter: Payer: Self-pay | Admitting: Family Medicine

## 2014-11-05 DIAGNOSIS — M75100 Unspecified rotator cuff tear or rupture of unspecified shoulder, not specified as traumatic: Secondary | ICD-10-CM | POA: Insufficient documentation

## 2014-11-05 DIAGNOSIS — E538 Deficiency of other specified B group vitamins: Secondary | ICD-10-CM | POA: Insufficient documentation

## 2014-11-05 DIAGNOSIS — N3090 Cystitis, unspecified without hematuria: Secondary | ICD-10-CM | POA: Insufficient documentation

## 2014-11-05 DIAGNOSIS — R252 Cramp and spasm: Secondary | ICD-10-CM | POA: Insufficient documentation

## 2014-11-05 DIAGNOSIS — M545 Low back pain, unspecified: Secondary | ICD-10-CM | POA: Insufficient documentation

## 2014-11-05 DIAGNOSIS — G8929 Other chronic pain: Secondary | ICD-10-CM | POA: Insufficient documentation

## 2014-11-05 DIAGNOSIS — E669 Obesity, unspecified: Secondary | ICD-10-CM | POA: Insufficient documentation

## 2014-11-05 DIAGNOSIS — I83813 Varicose veins of bilateral lower extremities with pain: Secondary | ICD-10-CM | POA: Insufficient documentation

## 2014-11-05 DIAGNOSIS — J3089 Other allergic rhinitis: Secondary | ICD-10-CM | POA: Insufficient documentation

## 2014-11-05 DIAGNOSIS — R700 Elevated erythrocyte sedimentation rate: Secondary | ICD-10-CM | POA: Insufficient documentation

## 2014-11-05 DIAGNOSIS — E785 Hyperlipidemia, unspecified: Secondary | ICD-10-CM | POA: Insufficient documentation

## 2014-11-05 DIAGNOSIS — I8390 Asymptomatic varicose veins of unspecified lower extremity: Secondary | ICD-10-CM | POA: Insufficient documentation

## 2014-11-05 DIAGNOSIS — E559 Vitamin D deficiency, unspecified: Secondary | ICD-10-CM | POA: Insufficient documentation

## 2014-11-05 DIAGNOSIS — K648 Other hemorrhoids: Secondary | ICD-10-CM | POA: Insufficient documentation

## 2014-11-05 DIAGNOSIS — K625 Hemorrhage of anus and rectum: Secondary | ICD-10-CM | POA: Insufficient documentation

## 2014-11-05 DIAGNOSIS — J42 Unspecified chronic bronchitis: Secondary | ICD-10-CM | POA: Insufficient documentation

## 2014-11-05 DIAGNOSIS — D739 Disease of spleen, unspecified: Secondary | ICD-10-CM | POA: Insufficient documentation

## 2014-11-05 DIAGNOSIS — J309 Allergic rhinitis, unspecified: Secondary | ICD-10-CM | POA: Insufficient documentation

## 2014-11-05 DIAGNOSIS — I10 Essential (primary) hypertension: Secondary | ICD-10-CM | POA: Insufficient documentation

## 2014-11-05 DIAGNOSIS — Z8619 Personal history of other infectious and parasitic diseases: Secondary | ICD-10-CM | POA: Insufficient documentation

## 2014-11-05 DIAGNOSIS — K5730 Diverticulosis of large intestine without perforation or abscess without bleeding: Secondary | ICD-10-CM | POA: Insufficient documentation

## 2014-11-05 DIAGNOSIS — I8393 Asymptomatic varicose veins of bilateral lower extremities: Secondary | ICD-10-CM | POA: Insufficient documentation

## 2014-11-05 DIAGNOSIS — K219 Gastro-esophageal reflux disease without esophagitis: Secondary | ICD-10-CM | POA: Insufficient documentation

## 2014-11-06 ENCOUNTER — Telehealth: Payer: Self-pay | Admitting: Family Medicine

## 2014-11-06 ENCOUNTER — Ambulatory Visit (INDEPENDENT_AMBULATORY_CARE_PROVIDER_SITE_OTHER): Payer: Medicare Other | Admitting: Family Medicine

## 2014-11-06 ENCOUNTER — Encounter: Payer: Self-pay | Admitting: Family Medicine

## 2014-11-06 VITALS — BP 142/86 | HR 89 | Temp 98.7°F | Resp 18 | Ht 65.5 in | Wt 232.7 lb

## 2014-11-06 DIAGNOSIS — E1165 Type 2 diabetes mellitus with hyperglycemia: Secondary | ICD-10-CM

## 2014-11-06 DIAGNOSIS — E559 Vitamin D deficiency, unspecified: Secondary | ICD-10-CM | POA: Diagnosis not present

## 2014-11-06 DIAGNOSIS — E1340 Other specified diabetes mellitus with diabetic neuropathy, unspecified: Secondary | ICD-10-CM

## 2014-11-06 DIAGNOSIS — I10 Essential (primary) hypertension: Secondary | ICD-10-CM

## 2014-11-06 DIAGNOSIS — M79645 Pain in left finger(s): Secondary | ICD-10-CM | POA: Diagnosis not present

## 2014-11-06 DIAGNOSIS — E785 Hyperlipidemia, unspecified: Secondary | ICD-10-CM

## 2014-11-06 DIAGNOSIS — IMO0002 Reserved for concepts with insufficient information to code with codable children: Secondary | ICD-10-CM

## 2014-11-06 DIAGNOSIS — J3089 Other allergic rhinitis: Secondary | ICD-10-CM

## 2014-11-06 DIAGNOSIS — K219 Gastro-esophageal reflux disease without esophagitis: Secondary | ICD-10-CM

## 2014-11-06 DIAGNOSIS — E538 Deficiency of other specified B group vitamins: Secondary | ICD-10-CM | POA: Diagnosis not present

## 2014-11-06 DIAGNOSIS — J309 Allergic rhinitis, unspecified: Secondary | ICD-10-CM

## 2014-11-06 MED ORDER — FLUTICASONE PROPIONATE 50 MCG/ACT NA SUSP: 2.0000 | NASAL | Status: AC | PRN

## 2014-11-06 MED ORDER — METOPROLOL SUCCINATE ER 50 MG PO TB24
50.0000 mg | ORAL_TABLET | Freq: Every day | ORAL | Status: DC
Start: 2014-11-06 — End: 2023-07-15

## 2014-11-06 MED ORDER — METOPROLOL TARTRATE 50 MG PO TABS: 50.0000 mg | ORAL_TABLET | Freq: Every day | ORAL | Status: DC

## 2014-11-06 MED ORDER — TELMISARTAN-HCTZ 80-25 MG PO TABS: 1.0000 | ORAL_TABLET | Freq: Every day | ORAL | Status: DC

## 2014-11-06 MED ORDER — AMLODIPINE BESYLATE 10 MG PO TABS: 10.0000 mg | ORAL_TABLET | Freq: Every day | ORAL | Status: DC

## 2014-11-06 NOTE — Telephone Encounter (Signed)
Should the Metoprolol be ER or regular?

## 2014-11-06 NOTE — Telephone Encounter (Signed)
Changed to Metoprolol 50mg  ER

## 2014-11-06 NOTE — Telephone Encounter (Signed)
Spoke with the Pharmacist at [facility] and D/C Metoprolol 50 mg and told them we faxed the new prescription in for Metoprolol ER 50 mg instead.

## 2014-11-06 NOTE — Progress Notes (Signed)
Name: Charlotte [name]   MRN: [id number]    DOB: 04-07-1956   Date:11/06/2014       Progress Note  Subjective  Chief Complaint  Chief Complaint  Patient presents with  . Hypertension  . Hyperlipidemia  . Gastrophageal Reflux    unchanged  . Hand Pain    Left Hand-Onset 1 month-worsening, no trauma     HPI  DMII: insulin requiring, seeing Dr. Gabriel Carina and is taking medication as prescribed, hgbA1C is coming down, last level 8.7. Urine micro is up to date.  Diabetic neuropathy is worse, having constant on her feet , taking Gabapentin, advised her to discuss with pain management to increase Gabapentin to four times daily to see if symptoms will improve  Finger pain: she states she has noticed left ring finger pain going on for a couple of months, stiff and swollen, no redness, pain getting progressively worse and she had to remove her ring now. Painful to touch also. She has a history of RA and OA. She has seen Dr. Jefm Bryant in the past.   Vitamin D deficiency and B12 deficiency: due for requeck in labs, taking SL B12 but not vitamin D otc at this time.    GERD: taking medication and is under control at this time, only a few episodes in the past month  Hyperlipidemia: taking pravastatin and is tolerating it well, time for lipid recheck  AR: using flonase, still has post-nasal drainage , but usually in am's   HTN: not at goal, taking medications, recent visit with Dr. Gabriel Carina also showed elevated bp, we will adjust dose   Patient Active Problem List   Diagnosis Date Noted  . B12 deficiency 11/05/2014  . Benign essential HTN 11/05/2014  . Cramps of lower extremity 11/05/2014  . Chronic LBP 11/05/2014  . Chronic bronchitis 11/05/2014  . Cystitis 11/05/2014  . Diverticulosis of colon 11/05/2014  . Dyslipidemia 11/05/2014  . Elevated erythrocyte sedimentation rate 11/05/2014  . Gastro-esophageal reflux disease without esophagitis 11/05/2014  . Bleeding internal hemorrhoids 11/05/2014  .  Diverticulosis 11/05/2014  . Disease of spleen 11/05/2014  . Perennial allergic rhinitis 11/05/2014  . Obesity (BMI 30-39.9) 11/05/2014  . Hemorrhage of rectum and anus 11/05/2014  . Rotator cuff syndrome 11/05/2014  . Leg varices 11/05/2014  . Vitamin D deficiency 11/05/2014  . DJD of shoulder 10/16/2014  . DDD (degenerative disc disease), lumbosacral 09/18/2014  . DDD (degenerative disc disease), cervical 09/18/2014  . DJD (degenerative joint disease) 09/18/2014  . Neuropathy due to secondary diabetes 09/18/2014  . Diabetes mellitus type 2, uncontrolled 07/14/2014  . Long term current use of insulin 07/14/2014  . Arthritis, degenerative 09/30/2013  . Arthritis or polyarthritis, rheumatoid 09/30/2013    Past Surgical History  Procedure Laterality Date  . Abdominal hysterectomy      partial  . Foot surgery    . Cholecystectomy    . Shoulder surgery Right   . Back surgery    . Neck surgery    . Ectopic pregnancy surgery      Family History  Problem Relation Age of Onset  . Diabetes Mother   . Diabetes Father   . Kidney disease Father   . Diabetes Sister   . Kidney disease Brother     Dialysis    History   Social History  . Marital Status: Married    Spouse Name: N/A  . Number of Children: N/A  . Years of Education: N/A   Occupational History  . Not on  file.   Social History Main Topics  . Smoking status: Former Smoker -- 0.50 packs/day for 32 years    Types: Cigarettes    Start date: 05/15/1972    Quit date: 09/20/2004  . Smokeless tobacco: Never Used  . Alcohol Use: No  . Drug Use: No  . Sexual Activity:    Partners: Male   Other Topics Concern  . Not on file   Social History Narrative     Current outpatient prescriptions:  .  amLODipine (NORVASC) 5 MG tablet, Take 5 mg by mouth daily., Disp: , Rfl:  .  aspirin 81 MG tablet, Take 81 mg by mouth daily., Disp: , Rfl:  .  B-12, METHYLCOBALAMIN, SL, Place 5,000 mcg under the tongue daily., Disp: ,  Rfl:  .  BD INSULIN SYRINGE ULTRAFINE 31G X 15/64" 1 ML MISC, , Disp: , Rfl:  .  fluticasone (FLONASE) 50 MCG/ACT nasal spray, Place 2 sprays into both nostrils as needed., Disp: , Rfl:  .  gabapentin (NEURONTIN) 300 MG capsule, Take 1 capsule (300 mg total) by mouth 3 (three) times daily., Disp: 90 capsule, Rfl: 0 .  glipiZIDE (GLUCOTROL) 10 MG tablet, Take 10 mg by mouth 2 (two) times daily before a meal., Disp: , Rfl:  .  GLUCOSE BLOOD VI, , Disp: , Rfl:  .  HYDROcodone-acetaminophen (NORCO/VICODIN) 5-325 MG per tablet, Limit 1 tab by mouth per day or twice a day if tolerated, Disp: 60 tablet, Rfl: 0 .  hydroxychloroquine (PLAQUENIL) 200 MG tablet, Take 200 mg by mouth daily., Disp: , Rfl:  .  insulin lispro (HUMALOG) 100 UNIT/ML injection, Inject 65 Units into the skin 2 (two) times daily., Disp: , Rfl:  .  insulin NPH Human (HUMULIN N,NOVOLIN N) 100 UNIT/ML injection, Inject 85 Units into the skin 2 (two) times daily before a meal. 9 am and 9 pm, Disp: , Rfl:  .  metFORMIN (GLUCOPHAGE) 500 MG tablet, Take 500 mg by mouth daily., Disp: , Rfl:  .  metoprolol (LOPRESSOR) 50 MG tablet, Take 50 mg by mouth daily., Disp: , Rfl:  .  omeprazole (PRILOSEC) 20 MG capsule, Take 20 mg by mouth daily., Disp: , Rfl:  .  pravastatin (PRAVACHOL) 40 MG tablet, Take 40 mg by mouth at bedtime., Disp: , Rfl:  .  telmisartan-hydrochlorothiazide (MICARDIS HCT) 80-25 MG per tablet, Take 1 tablet by mouth daily., Disp: , Rfl:  .  vitamin B-12 (CYANOCOBALAMIN) 1000 MCG tablet, Take 1,000 mcg by mouth daily., Disp: , Rfl:   Allergies  Allergen Reactions  . Ace Inhibitors     Other reaction(s): Unknown  . Sulfa Antibiotics Rash     ROS  Constitutional: Negative for fever or weight change.  Respiratory: cough at night, no  shortness of breath.   Cardiovascular: Negative for chest pain or palpitations.  Gastrointestinal: Negative for abdominal pain, no bowel changes.  Musculoskeletal:positive for joint  swelling.  Skin: Negative for rash.  Neurological: Negative for dizziness or headache. paresthesia No other specific complaints in a complete review of systems (except as listed in HPI above).   Objective  Filed Vitals:   11/06/14 0845  BP: 142/86  Pulse: 89  Temp: 98.7 F (37.1 C)  TempSrc: Oral  Resp: 18  Height: 5' 5.5" (1.664 m)  Weight: 232 lb 11.2 oz (105.552 kg)  SpO2: 97%    Body mass index is 38.12 kg/(m^2).  Physical Exam   Constitutional: Patient appears well-developed and well-nourished. No distress. Obese Eyes:  No  scleral icterus.  PERL Neck: Normal range of motion. Neck supple. Cardiovascular: Normal rate, regular rhythm and normal heart sounds.  No murmur heard. No BLE edema. Pulmonary/Chest: Effort normal and breath sounds normal. No respiratory distress. Abdominal: Soft.  There is no tenderness. Muscular skeletal: 4th left finger, tender to touch, stiff , pain goes from DIP to MCP joint, no redness , mild swelling over left MCP  Psychiatric: Patient has a normal mood and affect. behavior is normal. Judgment and thought content normal. Skin: acanthosis nigricans neck     PHQ2/9: Depression screen [facility] 2/9 10/19/2014 09/21/2014  Decreased Interest 1 1  Down, Depressed, Hopeless 1 1  PHQ - 2 Score 2 2  Altered sleeping 3 1  Tired, decreased energy 1 1  Change in appetite 0 0  Feeling bad or failure about yourself  0 0  Trouble concentrating 1 0  Moving slowly or fidgety/restless 1 0  Suicidal thoughts 0 0  PHQ-9 Score 8 4  Difficult doing work/chores Not difficult at all Not difficult at all    Fall Risk: Fall Risk  11/06/2014  Falls in the past year? Yes  Number falls in past yr: 1  Injury with Fall? No  Risk Factor Category  -  Risk for fall due to : -     Assessment & Plan   1. Benign essential HTN  Not at goal, adjusted dose of Norvasc , up from 5 mg, discussed possible side effects, including edema and call back if needed.  -  telmisartan-hydrochlorothiazide (MICARDIS HCT) 80-25 MG per tablet; Take 1 tablet by mouth daily.  Dispense: 30 tablet; Refill: 8 - metoprolol (LOPRESSOR) 50 MG tablet; Take 1 tablet (50 mg total) by mouth daily.  Dispense: 30 tablet; Refill: 8 - amLODipine (NORVASC) 10 MG tablet; Take 1 tablet (10 mg total) by mouth daily.  Dispense: 30 tablet; Refill: 3  2. Diabetes mellitus type 2, uncontrolled  Continue follow up with Dr Gabriel Carina, glucose is improving , but still not at goal, discussed importance of yearly eye exam  3. Neuropathy due to secondary diabetes  Talk to Dr. Primus Bravo about increasing dose of gabapentin, recheck B12 levels  4. Dyslipidemia  - Lipid Profile  5. Gastro-esophageal reflux disease without esophagitis  Controlled  6. Vitamin D deficiency  - Vitamin D (25 hydroxy)  7. B12 deficiency  - Vitamin B12  8. Finger pain, left  Check labs,advised her to see Dr. Jefm Bryant, b - he sees her for other finger problems.   - Sed Rate (ESR) - Uric acid - Rheumatoid Factor  9. Perennial allergic rhinitis  - fluticasone (FLONASE) 50 MCG/ACT nasal spray; Place 2 sprays into both nostrils as needed for allergies.  Dispense: 16 g; Refill: 2

## 2014-11-06 NOTE — Telephone Encounter (Signed)
Charlotte [name] from [facility] received prescription for metoprolol plain he thinks it should be metoprolol er. Please return call to [facility] [phone]

## 2014-11-07 ENCOUNTER — Other Ambulatory Visit: Payer: Self-pay | Admitting: Family Medicine

## 2014-11-07 LAB — VITAMIN B12: Vitamin B-12: 415 pg/mL (ref 211–946)

## 2014-11-07 LAB — LIPID PANEL
CHOL/HDL RATIO: 5.8 ratio — AB (ref 0.0–4.4)
Cholesterol, Total: 181 mg/dL (ref 100–199)
HDL: 31 mg/dL — AB (ref >39)
LDL Calculated: 109 mg/dL — ABNORMAL HIGH (ref 0–99)
Triglycerides: 207 mg/dL — ABNORMAL HIGH (ref 0–149)
VLDL CHOLESTEROL CAL: 41 mg/dL — AB (ref 5–40)

## 2014-11-07 LAB — SEDIMENTATION RATE: Sed Rate: 9 mm/hr (ref 0–40)

## 2014-11-07 LAB — RHEUMATOID FACTOR: Rhuematoid fact SerPl-aCnc: 11 IU/mL (ref 0.0–13.9)

## 2014-11-07 LAB — URIC ACID: URIC ACID: 7.2 mg/dL — AB (ref 2.5–7.1)

## 2014-11-07 LAB — VITAMIN D 25 HYDROXY (VIT D DEFICIENCY, FRACTURES): Vit D, 25-Hydroxy: 14.4 ng/mL — ABNORMAL LOW (ref 30.0–100.0)

## 2014-11-09 ENCOUNTER — Telehealth: Payer: Self-pay

## 2014-11-09 ENCOUNTER — Other Ambulatory Visit: Payer: Self-pay | Admitting: Family Medicine

## 2014-11-09 ENCOUNTER — Telehealth: Payer: Self-pay | Admitting: Pain Medicine

## 2014-11-09 DIAGNOSIS — Z1231 Encounter for screening mammogram for malignant neoplasm of breast: Secondary | ICD-10-CM

## 2014-11-09 NOTE — Telephone Encounter (Signed)
Nurses,  Dr. Ancil Boozer may increase the Neurontin today and I will continue Neurontin at the increased dose at time of patient's return appointment with me. Please inform Dr.Sowles of my response to her question regarding prescribing of Neurontin.

## 2014-11-09 NOTE — Telephone Encounter (Signed)
Dr Crisp:   Please advise. 

## 2014-11-09 NOTE — Telephone Encounter (Signed)
-----   Message from Steele Sizer, MD sent at 11/07/2014  9:37 PM EDT ----- Triglycerides is up, HDL is low. Needs to take statin daily - I will discuss  Switching to Atorvastatin on her next visit RF, sed rate within normal limits Uric acid is elevated, will wait for evaluation from nephrologist Vitamin D is low , but will wait for evaluation from Nephrologist and Endo.  She had hyperparathyroidism prior to kidney insufficiency, but likely a combination now.  She needs to be seen by Nephrologist and Endo . Referrals have been ordered.  Please notify patient, thank you

## 2014-11-09 NOTE — Telephone Encounter (Signed)
Jamie from Dr. Ancil Boozer office left vmail Friday at 10:02 Dr. Ancil Boozer would like to increase gabapentin to 4 times a day, if this is agreeable with you Dr. Primus Bravo,  Do you want to do the increase or do you want Dr. Ancil Boozer to increase

## 2014-11-09 NOTE — Telephone Encounter (Signed)
Patient notified of lab results by phone.  

## 2014-11-10 ENCOUNTER — Other Ambulatory Visit: Payer: Self-pay | Admitting: Family Medicine

## 2014-11-10 ENCOUNTER — Telehealth: Payer: Self-pay

## 2014-11-10 MED ORDER — GABAPENTIN 300 MG PO CAPS: 600.0000 mg | ORAL_CAPSULE | Freq: Three times a day (TID) | ORAL | Status: DC

## 2014-11-10 NOTE — Progress Notes (Signed)
Left vm for patient, that Dr. Ancil Boozer called in a new prescription for Gabapentin and to slowly titrate dosage.

## 2014-11-10 NOTE — Telephone Encounter (Signed)
Dr. Ancil Boozer office notified.

## 2014-11-10 NOTE — Telephone Encounter (Signed)
Dena called from Dr. Primus Bravo off authorizing Dr. Ancil Boozer to increased patient Neurontin and he would continue same increased dose once patient come back to his office.

## 2014-11-11 ENCOUNTER — Ambulatory Visit
Admission: RE | Admit: 2014-11-11 | Discharge: 2014-11-11 | Disposition: A | Discharge status: Home / Self Care | Payer: Medicare Other | Source: Ambulatory Visit | Attending: Family Medicine | Admitting: Family Medicine

## 2014-11-11 DIAGNOSIS — Z1231 Encounter for screening mammogram for malignant neoplasm of breast: Secondary | ICD-10-CM | POA: Insufficient documentation

## 2014-11-17 ENCOUNTER — Ambulatory Visit: Payer: Medicare Other | Attending: Pain Medicine | Admitting: Pain Medicine

## 2014-11-17 VITALS — BP 135/96 | HR 75 | Temp 96.8°F | Resp 15 | Ht 66.0 in | Wt 230.0 lb

## 2014-11-17 DIAGNOSIS — M47816 Spondylosis without myelopathy or radiculopathy, lumbar region: Secondary | ICD-10-CM | POA: Diagnosis not present

## 2014-11-17 DIAGNOSIS — M5136 Other intervertebral disc degeneration, lumbar region: Secondary | ICD-10-CM | POA: Insufficient documentation

## 2014-11-17 DIAGNOSIS — M25512 Pain in left shoulder: Secondary | ICD-10-CM | POA: Diagnosis present

## 2014-11-17 DIAGNOSIS — M150 Primary generalized (osteo)arthritis: Secondary | ICD-10-CM

## 2014-11-17 DIAGNOSIS — M19019 Primary osteoarthritis, unspecified shoulder: Secondary | ICD-10-CM | POA: Diagnosis not present

## 2014-11-17 DIAGNOSIS — E1140 Type 2 diabetes mellitus with diabetic neuropathy, unspecified: Secondary | ICD-10-CM | POA: Diagnosis not present

## 2014-11-17 DIAGNOSIS — M5416 Radiculopathy, lumbar region: Secondary | ICD-10-CM | POA: Diagnosis not present

## 2014-11-17 DIAGNOSIS — M5126 Other intervertebral disc displacement, lumbar region: Secondary | ICD-10-CM | POA: Diagnosis not present

## 2014-11-17 DIAGNOSIS — M461 Sacroiliitis, not elsewhere classified: Secondary | ICD-10-CM | POA: Diagnosis not present

## 2014-11-17 DIAGNOSIS — M4806 Spinal stenosis, lumbar region: Secondary | ICD-10-CM | POA: Insufficient documentation

## 2014-11-17 DIAGNOSIS — E1340 Other specified diabetes mellitus with diabetic neuropathy, unspecified: Secondary | ICD-10-CM

## 2014-11-17 DIAGNOSIS — M5030 Other cervical disc degeneration, unspecified cervical region: Secondary | ICD-10-CM

## 2014-11-17 DIAGNOSIS — M792 Neuralgia and neuritis, unspecified: Secondary | ICD-10-CM | POA: Diagnosis not present

## 2014-11-17 DIAGNOSIS — M19011 Primary osteoarthritis, right shoulder: Secondary | ICD-10-CM

## 2014-11-17 DIAGNOSIS — M5137 Other intervertebral disc degeneration, lumbosacral region: Secondary | ICD-10-CM

## 2014-11-17 DIAGNOSIS — M159 Polyosteoarthritis, unspecified: Secondary | ICD-10-CM

## 2014-11-17 DIAGNOSIS — M545 Low back pain: Secondary | ICD-10-CM | POA: Diagnosis present

## 2014-11-17 DIAGNOSIS — M25511 Pain in right shoulder: Secondary | ICD-10-CM | POA: Diagnosis present

## 2014-11-17 MED ORDER — HYDROCODONE-ACETAMINOPHEN 5-325 MG PO TABS: ORAL_TABLET | ORAL | Status: DC

## 2014-11-17 MED ORDER — GABAPENTIN 300 MG PO CAPS: ORAL_CAPSULE | ORAL | Status: DC

## 2014-11-17 NOTE — Patient Instructions (Addendum)
Continue present medications hydrocodone acetaminophen and Neurontin  F/U PCP Dr Ancil Boozer for evaliation of  BP and general medical  condition. Patient to have rheumatological evaluation as discussed  F/U surgical evaluation. Patient is to follow-up with orthopedic surgeon for evaluation of shoulder as discussed  F/U neurological evaluation  May consider radiofrequency rhizolysis or intraspinal procedures pending response to present treatment and F/U evaluation.  Patient to call [facility] should patient have concerns prior to scheduled return appointment.

## 2014-11-17 NOTE — Progress Notes (Signed)
   Subjective:    Patient ID: Charlotte [name], female    DOB: Feb 23, 1956, 59 y.o.   MRN: [id number]  HPI  Patient is 59 year old female returns to [facility] for further evaluation and treatment of pain involving shoulders as well as lower back lower extremity region. Patient states she is noticing swelling of the hands and will undergo rheumatological evaluation which will be arranged by Dr.Sowles. Patient will also undergo follow-up evaluation of shoulder with Dr.Kransinski as planned. We will avoid interventional treatment and continue present medications Neurontin and hydrocodone acetaminophen.   Review of Systems     Objective:   Physical Exam  There was tends to palpation of paraspinal musculature region cervical region cervical facet region palpation which reproduced mild discomfort. There was mild tenderness of the splenius capitis and occipitalis musculature regions. There appeared to be unremarkable Spurling's maneuver. Patient was without significantly increased pain with Tinel and Phalen's maneuver. No nodules of the hands were noted there was no increased increased warmth or erythema of the hands noted. There was questionably slight swelling of the hands palpation over the thoracic paraspinal musculature region thoracic facet region was a tends to palpation of moderate degree. No crepitus of the thoracic region was noted. Palpation over the lumbar paraspinal musculature and lumbar facet region was a tends to palpation of moderate moderately severe degree with lateral bending and rotation and extension and palpation of the lumbar facets reproducing moderately severe discomfort. He was limited to approximately 30 without increase of pain with dorsiflexion noted. There was negative clonus negative Homans. DTRs difficult to elicit patient had difficulty relaxing. Abdomen nontender and no costovertebral tenderness noted.      Assessment & Plan:   DDD lumbar spine  Lumbar  facet syndrome   MRI evidence of degenerative changes of the lumbar spine L4-5 level multilevel degenerative changes noted throughout the lumbar spine multifocal changes with evidence of broad-based disc bulging disc protrusion multifactorial thecal sac stenosis L4-5 disc space level bilateral exiting nerve root compromise also must be considered  DJD shoulder  Diabetic neuropathy    Plan    Continue present medications Neurontin and hydrocodone acetaminophen  F/U PCP for evaliation of  BP and general medical  Condition.  Rheumatological evaluation to be arranged by Dr.Sowles  Follow up evaluation of shoulder with Dr.Krasinski is plan  F/U surgical evaluation  F/U neurological evaluation  May consider radiofrequency rhizolysis or intraspinal procedures pending response to present treatment and F/U evaluation.  Patient to call [facility] Management Center should patient have concerns prior to scheduled return appointment. Marland Kitchen

## 2014-11-17 NOTE — Progress Notes (Signed)
Safety precautions to be maintained throughout the outpatient stay will include: orient to surroundings, keep bed in low position, maintain call bell within reach at all times, provide assistance with transfer out of bed and ambulation.  Discharge patient home, ambulatory at 0750hrs Script given for hydrocodone Patient to follow up with MD's Return in one month

## 2014-11-20 ENCOUNTER — Ambulatory Visit (INDEPENDENT_AMBULATORY_CARE_PROVIDER_SITE_OTHER): Payer: Medicare Other | Admitting: Family Medicine

## 2014-11-20 ENCOUNTER — Encounter: Payer: Self-pay | Admitting: Family Medicine

## 2014-11-20 VITALS — BP 112/80 | HR 90 | Temp 98.7°F | Resp 16 | Ht 65.5 in | Wt 231.7 lb

## 2014-11-20 DIAGNOSIS — Z8619 Personal history of other infectious and parasitic diseases: Secondary | ICD-10-CM | POA: Diagnosis not present

## 2014-11-20 DIAGNOSIS — R1084 Generalized abdominal pain: Secondary | ICD-10-CM

## 2014-11-20 DIAGNOSIS — R1013 Epigastric pain: Secondary | ICD-10-CM

## 2014-11-20 DIAGNOSIS — K625 Hemorrhage of anus and rectum: Secondary | ICD-10-CM

## 2014-11-20 MED ORDER — RANITIDINE HCL 150 MG PO TABS: 150.0000 mg | ORAL_TABLET | Freq: Two times a day (BID) | ORAL | Status: DC

## 2014-11-20 NOTE — Patient Instructions (Signed)

## 2014-11-20 NOTE — Progress Notes (Signed)
Name: Charlotte [name]   MRN: [id number]    DOB: 1955-09-15   Date:11/20/2014       Progress Note  Subjective  Chief Complaint  Chief Complaint  Patient presents with  . Abdominal Pain    onset 5 days ago epigastric radiating to mid abdomen.  Worsening and constant.  Pt denies any dirrhrea or vomiting but has had nausea    HPI  Dyspepsia: she sates she has a history of h. Pylori and she states symptoms are the same. Described as upper abdominal pain, indigestion, regurgitation, fizzy sensation inside her stomach, she is feeling nauseated and has decrease in appetite and mild abdominal cramping. She has a history of blood in stools from hemorrhoids and also has a history of diverticulitis - states sometimes mixed with stools. Seen by Dr. Mechele Collin in the past, but last colonoscopy was in 2008.     Patient Active Problem List   Diagnosis Date Noted  . B12 deficiency 11/05/2014  . Benign essential HTN 11/05/2014  . Cramps of lower extremity 11/05/2014  . Chronic LBP 11/05/2014  . Chronic bronchitis 11/05/2014  . Cystitis 11/05/2014  . Diverticulosis of colon 11/05/2014  . Dyslipidemia 11/05/2014  . Elevated erythrocyte sedimentation rate 11/05/2014  . Gastro-esophageal reflux disease without esophagitis 11/05/2014  . Bleeding internal hemorrhoids 11/05/2014  . Diverticulosis 11/05/2014  . Disease of spleen 11/05/2014  . Perennial allergic rhinitis 11/05/2014  . Obesity (BMI 30-39.9) 11/05/2014  . Hemorrhage of rectum and anus 11/05/2014  . Rotator cuff syndrome 11/05/2014  . Leg varices 11/05/2014  . Vitamin D deficiency 11/05/2014  . DJD of shoulder 10/16/2014  . DDD (degenerative disc disease), lumbosacral 09/18/2014  . DDD (degenerative disc disease), cervical 09/18/2014  . DJD (degenerative joint disease) 09/18/2014  . Neuropathy due to secondary diabetes 09/18/2014  . Diabetes mellitus type 2, uncontrolled 07/14/2014  . Long term current use of insulin 07/14/2014  .  Arthritis, degenerative 09/30/2013  . Arthritis or polyarthritis, rheumatoid 09/30/2013    Past Surgical History  Procedure Laterality Date  . Abdominal hysterectomy      partial  . Foot surgery    . Cholecystectomy    . Shoulder surgery Right   . Back surgery    . Neck surgery    . Ectopic pregnancy surgery      Family History  Problem Relation Age of Onset  . Diabetes Mother   . Diabetes Father   . Kidney disease Father   . Diabetes Sister   . Kidney disease Brother     Dialysis    History   Social History  . Marital Status: Married    Spouse Name: N/A  . Number of Children: N/A  . Years of Education: N/A   Occupational History  . Not on file.   Social History Main Topics  . Smoking status: Former Smoker -- 0.50 packs/day for 32 years    Types: Cigarettes    Start date: 05/15/1972    Quit date: 09/20/2004  . Smokeless tobacco: Never Used  . Alcohol Use: No  . Drug Use: No  . Sexual Activity:    Partners: Male   Other Topics Concern  . Not on file   Social History Narrative     Current outpatient prescriptions:  .  amLODipine (NORVASC) 10 MG tablet, Take 1 tablet (10 mg total) by mouth daily., Disp: 30 tablet, Rfl: 3 .  aspirin 81 MG tablet, Take 81 mg by mouth daily., Disp: , Rfl:  .  B-12, METHYLCOBALAMIN, SL, Place 5,000 mcg under the tongue daily., Disp: , Rfl:  .  BD INSULIN SYRINGE ULTRAFINE 31G X 15/64" 1 ML MISC, , Disp: , Rfl:  .  fluticasone (FLONASE) 50 MCG/ACT nasal spray, Place 2 sprays into both nostrils as needed for allergies., Disp: 16 g, Rfl: 2 .  gabapentin (NEURONTIN) 300 MG capsule, Limit 1-2 tabs by mouth 2-3 times per day if tolerated, Disp: 170 capsule, Rfl: 0 .  glipiZIDE (GLUCOTROL) 10 MG tablet, Take 10 mg by mouth 2 (two) times daily before a meal., Disp: , Rfl:  .  GLUCOSE BLOOD VI, , Disp: , Rfl:  .  HYDROcodone-acetaminophen (NORCO/VICODIN) 5-325 MG per tablet, Limit 1 tab by mouth per day or twice a day if tolerated,  Disp: 60 tablet, Rfl: 0 .  hydroxychloroquine (PLAQUENIL) 200 MG tablet, Take 200 mg by mouth daily., Disp: , Rfl:  .  insulin lispro (HUMALOG) 100 UNIT/ML injection, Inject 65 Units into the skin 2 (two) times daily., Disp: , Rfl:  .  insulin NPH Human (HUMULIN N,NOVOLIN N) 100 UNIT/ML injection, Inject 85 Units into the skin 2 (two) times daily before a meal. 9 am and 9 pm, Disp: , Rfl:  .  metFORMIN (GLUCOPHAGE) 500 MG tablet, Take 500 mg by mouth daily., Disp: , Rfl:  .  metoprolol succinate (TOPROL-XL) 50 MG 24 hr tablet, Take 1 tablet (50 mg total) by mouth daily. Take with or immediately following a meal., Disp: 30 tablet, Rfl: 3 .  omeprazole (PRILOSEC) 20 MG capsule, Take 20 mg by mouth daily., Disp: , Rfl:  .  pravastatin (PRAVACHOL) 40 MG tablet, Take 40 mg by mouth at bedtime., Disp: , Rfl:  .  telmisartan-hydrochlorothiazide (MICARDIS HCT) 80-25 MG per tablet, Take 1 tablet by mouth daily., Disp: 30 tablet, Rfl: 8 .  ranitidine (ZANTAC) 150 MG tablet, Take 1 tablet (150 mg total) by mouth 2 (two) times daily., Disp: 60 tablet, Rfl: 0  Allergies  Allergen Reactions  . Ace Inhibitors     Other reaction(s): Unknown  . Sulfa Antibiotics Rash     ROS  Constitutional: Negative for fever or weight change.  Respiratory: Negative for cough and shortness of breath.   Cardiovascular: Negative for chest pain or palpitations.  Gastrointestinal: Positive  for abdominal pain, no bowel changes.  Musculoskeletal: positive for joint pain and back pain  Skin: Negative for rash.  Neurological: Negative for dizziness or headache.  No other specific complaints in a complete review of systems (except as listed in HPI above). Objective  Filed Vitals:   11/20/14 1135  BP: 112/80  Pulse: 90  Temp: 98.7 F (37.1 C)  TempSrc: Oral  Resp: 16  Height: 5' 5.5" (1.664 m)  Weight: 231 lb 11.2 oz (105.098 kg)  SpO2: 97%    Body mass index is 37.96 kg/(m^2).  Physical Exam  Constitutional:  Patient appears well-developed and well-nourished. No distress.  Obesity  Eyes:  No scleral icterus.  Neck: Normal range of motion. Neck supple. Cardiovascular: Normal rate, regular rhythm and normal heart sounds.  No murmur heard. No BLE edema. Pulmonary/Chest: Effort normal and breath sounds normal. No respiratory distress. Abdominal: Soft.  Generalized tenderness, but worse on RUQ and epigastric area, voluntary guarding, no rebound, normal bowel sounds Psychiatric: Patient has a normal mood and affect. behavior is normal. Judgment and thought content normal.  Recent Results (from the past 2160 hour(s))  Lipid Profile     Status: Abnormal   Collection Time: 11/06/14  10:20 AM  Result Value Ref Range   Cholesterol, Total 181 100 - 199 mg/dL   Triglycerides 207 (H) 0 - 149 mg/dL   HDL 31 (L) >39 mg/dL    Comment: According to ATP-III Guidelines, HDL-C >59 mg/dL is considered a negative risk factor for CHD.    VLDL Cholesterol Cal 41 (H) 5 - 40 mg/dL   LDL Calculated 109 (H) 0 - 99 mg/dL   Chol/HDL Ratio 5.8 (H) 0.0 - 4.4 ratio units    Comment:                                   T. Chol/HDL Ratio                                             Men  Women                               1/2 Avg.Risk  3.4    3.3                                   Avg.Risk  5.0    4.4                                2X Avg.Risk  9.6    7.1                                3X Avg.Risk 23.4   11.0   Vitamin D (25 hydroxy)     Status: Abnormal   Collection Time: 11/06/14 10:20 AM  Result Value Ref Range   Vit D, 25-Hydroxy 14.4 (L) 30.0 - 100.0 ng/mL    Comment: Vitamin D deficiency has been defined by the [place] practice guideline as a level of serum 25-OH vitamin D less than 20 ng/mL (1,2). The Endocrine Society went on to further define vitamin D insufficiency as a level between 21 and 29 ng/mL (2). 1. IOM (Institute of Medicine). 2010. Dietary reference    intakes for  calcium and D. [place]: The    [employer]. 2. Holick MF, [place], Bischoff-Ferrari HA, et al.    Evaluation, treatment, and prevention of vitamin D    deficiency: an Endocrine Society clinical practice    guideline. JCEM. 2011 Jul; 96(7):1911-30.   Vitamin B12     Status: None   Collection Time: 11/06/14 10:20 AM  Result Value Ref Range   Vitamin B-12 415 211 - 946 pg/mL  Sed Rate (ESR)     Status: None   Collection Time: 11/06/14 10:20 AM  Result Value Ref Range   Sed Rate 9 0 - 40 mm/hr  Uric acid     Status: Abnormal   Collection Time: 11/06/14 10:20 AM  Result Value Ref Range   Uric Acid 7.2 (H) 2.5 - 7.1 mg/dL    Comment:            Therapeutic target for gout patients: <6.0  Rheumatoid Factor     Status:  None   Collection Time: 11/06/14 10:20 AM  Result Value Ref Range   Rhuematoid fact SerPl-aCnc 11.0 0.0 - 13.9 IU/mL     PHQ2/9: Depression screen [facility] 2/9 10/19/2014 09/21/2014  Decreased Interest 1 1  Down, Depressed, Hopeless 1 1  PHQ - 2 Score 2 2  Altered sleeping 3 1  Tired, decreased energy 1 1  Change in appetite 0 0  Feeling bad or failure about yourself  0 0  Trouble concentrating 1 0  Moving slowly or fidgety/restless 1 0  Suicidal thoughts 0 0  PHQ-9 Score 8 4  Difficult doing work/chores Not difficult at all Not difficult at all     Fall Risk: Fall Risk  11/06/2014 10/19/2014 09/21/2014  Falls in the past year? Yes Yes Yes  Number falls in past yr: $Remove'1 1 2 '[id number]$ or more  Injury with Fall? No Yes Yes  Risk Factor Category  - - High Fall Risk  Risk for fall due to : - Impaired vision;Medication side effect History of fall(s)  Follow up - Falls prevention discussed Falls prevention discussed   Assessment & Plan  1. Dyspepsia Go to [facility] if gets worse over the weekend - H. pylori breath test - Ambulatory referral to Gastroenterology - ranitidine (ZANTAC) 150 MG tablet; Take 1 tablet (150 mg total) by mouth 2 (two) times daily.  Dispense: 60  tablet; Refill: 0 - CBC with Differential - Comprehensive Metabolic Panel (CMET)  2. History of Helicobacter pylori infection  - H. pylori breath test  3. Hemorrhage of rectum and anus  - Ambulatory referral to Gastroenterology  4. Generalized abdominal pain  - CBC with Differential - Comprehensive Metabolic Panel (CMET)

## 2014-11-21 LAB — CBC WITH DIFFERENTIAL/PLATELET
Basophils Absolute: 0 10*3/uL (ref 0.0–0.2)
Basos: 0 %
EOS (ABSOLUTE): 0.1 10*3/uL (ref 0.0–0.4)
Eos: 1 %
Hematocrit: 36.3 % (ref 34.0–46.6)
Hemoglobin: 11.7 g/dL (ref 11.1–15.9)
IMMATURE GRANS (ABS): 0 10*3/uL (ref 0.0–0.1)
IMMATURE GRANULOCYTES: 0 %
LYMPHS ABS: 4.1 10*3/uL — AB (ref 0.7–3.1)
Lymphs: 44 %
MCH: 25 pg — AB (ref 26.6–33.0)
MCHC: 32.2 g/dL (ref 31.5–35.7)
MCV: 78 fL — AB (ref 79–97)
MONOS ABS: 0.6 10*3/uL (ref 0.1–0.9)
Monocytes: 6 %
Neutrophils Absolute: 4.5 10*3/uL (ref 1.4–7.0)
Neutrophils: 49 %
PLATELETS: 399 10*3/uL — AB (ref 150–379)
RBC: 4.68 x10E6/uL (ref 3.77–5.28)
RDW: 15.4 % (ref 12.3–15.4)
WBC: 9.4 10*3/uL (ref 3.4–10.8)

## 2014-11-21 LAB — COMPREHENSIVE METABOLIC PANEL
ALBUMIN: 4.2 g/dL (ref 3.5–5.5)
ALK PHOS: 78 IU/L (ref 39–117)
ALT: 12 IU/L (ref 0–32)
AST: 9 IU/L (ref 0–40)
Albumin/Globulin Ratio: 1.4 (ref 1.1–2.5)
BUN / CREAT RATIO: 13 (ref 9–23)
BUN: 11 mg/dL (ref 6–24)
Bilirubin Total: 0.3 mg/dL (ref 0.0–1.2)
CO2: 26 mmol/L (ref 18–29)
CREATININE: 0.84 mg/dL (ref 0.57–1.00)
Calcium: 9.8 mg/dL (ref 8.7–10.2)
Chloride: 99 mmol/L (ref 97–108)
GFR calc non Af Amer: 77 mL/min/{1.73_m2} (ref >59)
GFR, EST AFRICAN AMERICAN: 89 mL/min/{1.73_m2} (ref >59)
GLOBULIN, TOTAL: 3.1 g/dL (ref 1.5–4.5)
GLUCOSE: 162 mg/dL — AB (ref 65–99)
Potassium: 3.9 mmol/L (ref 3.5–5.2)
Sodium: 141 mmol/L (ref 134–144)
TOTAL PROTEIN: 7.3 g/dL (ref 6.0–8.5)

## 2014-11-22 LAB — H. PYLORI BREATH TEST: H. pylori UBiT: NEGATIVE

## 2014-11-23 ENCOUNTER — Telehealth: Payer: Self-pay

## 2014-11-23 DIAGNOSIS — M069 Rheumatoid arthritis, unspecified: Secondary | ICD-10-CM

## 2014-11-23 DIAGNOSIS — R1013 Epigastric pain: Secondary | ICD-10-CM

## 2014-11-23 NOTE — Telephone Encounter (Signed)
Cancelled referral to Rheumatology, please order referral to for Dr. Vira Agar at [facility]. Thank you.

## 2014-11-23 NOTE — Telephone Encounter (Signed)
-----   Message from Steele Sizer, MD sent at 11/22/2014  4:46 PM EDT ----- H. Pylori negative, no anemia, glucose is elevated, normal kidney and liver function test Please notify patient, thank you

## 2014-11-23 NOTE — Telephone Encounter (Signed)
PT SAID THAT DR TOLD HER THAT THEY COULD DO A REFERRAL TO DR Vira Agar AT [facility]. SHE IS IN VERY BAD PAIN AND NEEDS THIS DONE ASAP.

## 2014-11-23 NOTE — Telephone Encounter (Signed)
Patient notified of labs and states she is still having pain from her breast area all the way down. But the Ranitidine you prescriptions helped easy the pain a little.

## 2014-11-25 DIAGNOSIS — R1013 Epigastric pain: Secondary | ICD-10-CM | POA: Diagnosis not present

## 2014-12-02 ENCOUNTER — Ambulatory Visit (INDEPENDENT_AMBULATORY_CARE_PROVIDER_SITE_OTHER): Payer: Medicare Other | Admitting: Family Medicine

## 2014-12-02 ENCOUNTER — Encounter: Payer: Self-pay | Admitting: Family Medicine

## 2014-12-02 VITALS — BP 112/78 | HR 79 | Temp 98.1°F | Resp 16 | Ht 66.0 in | Wt 224.6 lb

## 2014-12-02 DIAGNOSIS — K2900 Acute gastritis without bleeding: Secondary | ICD-10-CM | POA: Diagnosis not present

## 2014-12-02 DIAGNOSIS — E559 Vitamin D deficiency, unspecified: Secondary | ICD-10-CM

## 2014-12-02 DIAGNOSIS — R7989 Other specified abnormal findings of blood chemistry: Secondary | ICD-10-CM | POA: Diagnosis not present

## 2014-12-02 DIAGNOSIS — E790 Hyperuricemia without signs of inflammatory arthritis and tophaceous disease: Secondary | ICD-10-CM | POA: Insufficient documentation

## 2014-12-02 MED ORDER — ERGOCALCIFEROL 1.25 MG (50000 UT) PO CAPS: 50000.0000 [IU] | ORAL_CAPSULE | ORAL | Status: DC

## 2014-12-02 MED ORDER — ALLOPURINOL 100 MG PO TABS: 100.0000 mg | ORAL_TABLET | Freq: Every day | ORAL | Status: DC

## 2014-12-02 NOTE — Progress Notes (Signed)
Name: Charlotte [name]   MRN: [id number]    DOB: [birth year]   Date:12/02/2014       Progress Note  Subjective  Chief Complaint  Chief Complaint  Patient presents with  . Follow-up    1 week abdominal pain  . Abdominal Pain    Pt states Dr Vira Agar GI and dx her with stomach infection and was given medications (improving)    HPI  Gastritis: she was seen here on July 8th and we checked comp panel, CBC and urea breath test. Labs were unremarkable, she was given pepcid twice and referred to Dr. Vira Agar, she was seen by GI on July 13 th , diagnosed with gastritis and given sucralfate to dissolve in water and take four times daily. She is feeling better, pain is not as intense, but still present, eating a bland diet, appetite is poor because it causes pain when she eats. She has lost 7 lbs since last visit. She has a follow up with Dr. Vira Agar in three weeks.  Reviewed labs with patient and we will send rx vitamin D and start allopurinol for elevated uric acid  Patient Active Problem List   Diagnosis Date Noted  . B12 deficiency 11/05/2014  . Benign essential HTN 11/05/2014  . Cramps of lower extremity 11/05/2014  . Chronic LBP 11/05/2014  . Chronic bronchitis 11/05/2014  . Cystitis 11/05/2014  . Diverticulosis of colon 11/05/2014  . Dyslipidemia 11/05/2014  . Elevated erythrocyte sedimentation rate 11/05/2014  . Gastro-esophageal reflux disease without esophagitis 11/05/2014  . Bleeding internal hemorrhoids 11/05/2014  . Disease of spleen 11/05/2014  . Perennial allergic rhinitis 11/05/2014  . Obesity (BMI 30-39.9) 11/05/2014  . Hemorrhage of rectum and anus 11/05/2014  . Rotator cuff syndrome 11/05/2014  . Leg varices 11/05/2014  . Vitamin D deficiency 11/05/2014  . DJD of shoulder 10/16/2014  . DDD (degenerative disc disease), lumbosacral 09/18/2014  . DDD (degenerative disc disease), cervical 09/18/2014  . DJD (degenerative joint disease) 09/18/2014  . Neuropathy due to  secondary diabetes 09/18/2014  . Diabetes mellitus type 2, uncontrolled 07/14/2014  . Long term current use of insulin 07/14/2014  . Arthritis, degenerative 09/30/2013  . Arthritis or polyarthritis, rheumatoid 09/30/2013    History  Substance Use Topics  . Smoking status: Former Smoker -- 0.50 packs/day for 32 years    Types: Cigarettes    Start date: 05/15/1972    Quit date: 09/20/2004  . Smokeless tobacco: Never Used  . Alcohol Use: No     Current outpatient prescriptions:  .  amLODipine (NORVASC) 10 MG tablet, Take 1 tablet (10 mg total) by mouth daily., Disp: 30 tablet, Rfl: 3 .  aspirin 81 MG tablet, Take 81 mg by mouth daily., Disp: , Rfl:  .  B-12, METHYLCOBALAMIN, SL, Place 5,000 mcg under the tongue daily., Disp: , Rfl:  .  BD INSULIN SYRINGE ULTRAFINE 31G X 15/64" 1 ML MISC, , Disp: , Rfl:  .  cholecalciferol (VITAMIN D) 1000 UNITS tablet, Take 2,000 Units by mouth daily., Disp: , Rfl:  .  fluticasone (FLONASE) 50 MCG/ACT nasal spray, Place 2 sprays into both nostrils as needed for allergies., Disp: 16 g, Rfl: 2 .  gabapentin (NEURONTIN) 300 MG capsule, Limit 1-2 tabs by mouth 2-3 times per day if tolerated, Disp: 170 capsule, Rfl: 0 .  glipiZIDE (GLUCOTROL) 10 MG tablet, Take 10 mg by mouth 2 (two) times daily before a meal., Disp: , Rfl:  .  GLUCOSE BLOOD VI, , Disp: , Rfl:  .  HYDROcodone-acetaminophen (NORCO/VICODIN) 5-325 MG per tablet, Limit 1 tab by mouth per day or twice a day if tolerated, Disp: 60 tablet, Rfl: 0 .  hydroxychloroquine (PLAQUENIL) 200 MG tablet, Take 200 mg by mouth daily., Disp: , Rfl:  .  insulin lispro (HUMALOG) 100 UNIT/ML injection, Inject 65 Units into the skin 2 (two) times daily., Disp: , Rfl:  .  insulin NPH Human (HUMULIN N,NOVOLIN N) 100 UNIT/ML injection, Inject 85 Units into the skin 2 (two) times daily before a meal. 9 am and 9 pm, Disp: , Rfl:  .  metFORMIN (GLUCOPHAGE) 500 MG tablet, Take 500 mg by mouth daily., Disp: , Rfl:  .   metoprolol succinate (TOPROL-XL) 50 MG 24 hr tablet, Take 1 tablet (50 mg total) by mouth daily. Take with or immediately following a meal., Disp: 30 tablet, Rfl: 3 .  omeprazole (PRILOSEC) 20 MG capsule, Take 20 mg by mouth daily., Disp: , Rfl:  .  pravastatin (PRAVACHOL) 40 MG tablet, Take 40 mg by mouth at bedtime., Disp: , Rfl:  .  ranitidine (ZANTAC) 150 MG tablet, Take 1 tablet (150 mg total) by mouth 2 (two) times daily., Disp: 60 tablet, Rfl: 0 .  sucralfate (CARAFATE) 1 G tablet, Take 1 tablet by mouth 4 (four) times daily., Disp: , Rfl:  .  telmisartan-hydrochlorothiazide (MICARDIS HCT) 80-25 MG per tablet, Take 1 tablet by mouth daily., Disp: 30 tablet, Rfl: 8  Allergies  Allergen Reactions  . Ace Inhibitors     Other reaction(s): Unknown  . Sulfa Antibiotics Rash    ROS  Ten systems reviewed and is negative except as mentioned in HPI and has chronic back pain with radiculitis  Objective  Filed Vitals:   12/02/14 1009  BP: 112/78  Pulse: 79  Temp: 98.1 F (36.7 C)  TempSrc: Oral  Resp: 16  Height: $Remove'5\' 6"'ShHoAvo$  (1.676 m)  Weight: 224 lb 9.6 oz (101.878 kg)  SpO2: 97%    Body mass index is 36.27 kg/(m^2).    Physical Exam  Constitutional: Patient appears well-developed and well-nourished. No distress. Obesity  Eyes: No scleral icterus. PERL Neck: Normal range of motion. Neck supple. Cardiovascular: Normal rate, regular rhythm and normal heart sounds. No murmur heard. No BLE edema. Pulmonary/Chest: Effort normal and breath sounds normal. No respiratory distress. Abdominal: Soft. Pain during palpation of RUQ and epigastric area, no voluntary guarding, no rebound, normal bowel sounds Psychiatric: Patient has a normal mood and affect. behavior is normal. Judgment and thought content normal.   Recent Results (from the past 2160 hour(s))  Lipid Profile     Status: Abnormal   Collection Time: 11/06/14 10:20 AM  Result Value Ref Range   Cholesterol, Total 181 100 -  199 mg/dL   Triglycerides 207 (H) 0 - 149 mg/dL   HDL 31 (L) >39 mg/dL    Comment: According to ATP-III Guidelines, HDL-C >59 mg/dL is considered a negative risk factor for CHD.    VLDL Cholesterol Cal 41 (H) 5 - 40 mg/dL   LDL Calculated 109 (H) 0 - 99 mg/dL   Chol/HDL Ratio 5.8 (H) 0.0 - 4.4 ratio units    Comment:                                   T. Chol/HDL Ratio  Men  Women                               1/2 Avg.Risk  3.4    3.3                                   Avg.Risk  5.0    4.4                                2X Avg.Risk  9.6    7.1                                3X Avg.Risk 23.4   11.0   Vitamin D (25 hydroxy)     Status: Abnormal   Collection Time: 11/06/14 10:20 AM  Result Value Ref Range   Vit D, 25-Hydroxy 14.4 (L) 30.0 - 100.0 ng/mL    Comment: Vitamin D deficiency has been defined by the [place] practice guideline as a level of serum 25-OH vitamin D less than 20 ng/mL (1,2). The Endocrine Society went on to further define vitamin D insufficiency as a level between 21 and 29 ng/mL (2). 1. IOM (Institute of Medicine). 2010. Dietary reference    intakes for calcium and D. [place]: The    [employer]. 2. Holick MF, Binkley [place], Bischoff-Ferrari HA, et al.    Evaluation, treatment, and prevention of vitamin D    deficiency: an Endocrine Society clinical practice    guideline. JCEM. 2011 Jul; 96(7):1911-30.   Vitamin B12     Status: None   Collection Time: 11/06/14 10:20 AM  Result Value Ref Range   Vitamin B-12 415 211 - 946 pg/mL  Sed Rate (ESR)     Status: None   Collection Time: 11/06/14 10:20 AM  Result Value Ref Range   Sed Rate 9 0 - 40 mm/hr  Uric acid     Status: Abnormal   Collection Time: 11/06/14 10:20 AM  Result Value Ref Range   Uric Acid 7.2 (H) 2.5 - 7.1 mg/dL    Comment:            Therapeutic target for gout patients: <6.0  Rheumatoid Factor      Status: None   Collection Time: 11/06/14 10:20 AM  Result Value Ref Range   Rhuematoid fact SerPl-aCnc 11.0 0.0 - 13.9 IU/mL  H. pylori breath test     Status: None   Collection Time: 11/20/14 12:30 PM  Result Value Ref Range   H. pylori UBiT Negative Negative  CBC with Differential/Platelet     Status: Abnormal   Collection Time: 11/20/14 12:30 PM  Result Value Ref Range   WBC 9.4 3.4 - 10.8 x10E3/uL   RBC 4.68 3.77 - 5.28 x10E6/uL   Hemoglobin 11.7 11.1 - 15.9 g/dL   Hematocrit 36.3 34.0 - 46.6 %   MCV 78 (L) 79 - 97 fL   MCH 25.0 (L) 26.6 - 33.0 pg   MCHC 32.2 31.5 - 35.7 g/dL   RDW 15.4 12.3 - 15.4 %   Platelets 399 (H) 150 - 379 x10E3/uL   Neutrophils 49 %   Lymphs 44 %   Monocytes 6 %   Eos 1 %  Basos 0 %   Neutrophils Absolute 4.5 1.4 - 7.0 x10E3/uL   Lymphocytes Absolute 4.1 (H) 0.7 - 3.1 x10E3/uL   Monocytes Absolute 0.6 0.1 - 0.9 x10E3/uL   EOS (ABSOLUTE) 0.1 0.0 - 0.4 x10E3/uL   Basophils Absolute 0.0 0.0 - 0.2 x10E3/uL   Immature Granulocytes 0 %   Immature Grans (Abs) 0.0 0.0 - 0.1 x10E3/uL  Comprehensive metabolic panel     Status: Abnormal   Collection Time: 11/20/14 12:30 PM  Result Value Ref Range   Glucose 162 (H) 65 - 99 mg/dL   BUN 11 6 - 24 mg/dL   Creatinine, Ser 0.84 0.57 - 1.00 mg/dL   GFR calc non Af Amer 77 >59 mL/min/1.73   GFR calc Af Amer 89 >59 mL/min/1.73   BUN/Creatinine Ratio 13 9 - 23   Sodium 141 134 - 144 mmol/L   Potassium 3.9 3.5 - 5.2 mmol/L   Chloride 99 97 - 108 mmol/L   CO2 26 18 - 29 mmol/L   Calcium 9.8 8.7 - 10.2 mg/dL   Total Protein 7.3 6.0 - 8.5 g/dL   Albumin 4.2 3.5 - 5.5 g/dL   Globulin, Total 3.1 1.5 - 4.5 g/dL   Albumin/Globulin Ratio 1.4 1.1 - 2.5   Bilirubin Total 0.3 0.0 - 1.2 mg/dL   Alkaline Phosphatase 78 39 - 117 IU/L   AST 9 0 - 40 IU/L   ALT 12 0 - 32 IU/L     Assessment & Plan  1. Acute gastritis without hemorrhage  Continue sucralfate, and follow up with Dr. Vira Agar , also continue PPI and H2  receptor blocker and bland diet  2. Elevated uric acid in blood  - allopurinol (ZYLOPRIM) 100 MG tablet; Take 1 tablet (100 mg total) by mouth daily.  Dispense: 30 tablet; Refill: 2  3. Vitamin D deficiency  - ergocalciferol (VITAMIN D2) 50000 UNITS capsule; Take 1 capsule (50,000 Units total) by mouth once a week.  Dispense: 12 capsule; Refill: 1

## 2014-12-17 ENCOUNTER — Ambulatory Visit: Payer: Medicare Other | Attending: Pain Medicine | Admitting: Pain Medicine

## 2014-12-17 ENCOUNTER — Encounter: Payer: Self-pay | Admitting: Pain Medicine

## 2014-12-17 VITALS — BP 139/85 | HR 75 | Temp 98.3°F | Resp 18 | Ht 66.0 in | Wt 223.0 lb

## 2014-12-17 DIAGNOSIS — M19019 Primary osteoarthritis, unspecified shoulder: Secondary | ICD-10-CM | POA: Diagnosis not present

## 2014-12-17 DIAGNOSIS — M5137 Other intervertebral disc degeneration, lumbosacral region: Secondary | ICD-10-CM

## 2014-12-17 DIAGNOSIS — M461 Sacroiliitis, not elsewhere classified: Secondary | ICD-10-CM | POA: Diagnosis not present

## 2014-12-17 DIAGNOSIS — M5030 Other cervical disc degeneration, unspecified cervical region: Secondary | ICD-10-CM

## 2014-12-17 DIAGNOSIS — M5416 Radiculopathy, lumbar region: Secondary | ICD-10-CM | POA: Diagnosis not present

## 2014-12-17 DIAGNOSIS — M5126 Other intervertebral disc displacement, lumbar region: Secondary | ICD-10-CM | POA: Diagnosis not present

## 2014-12-17 DIAGNOSIS — M4806 Spinal stenosis, lumbar region: Secondary | ICD-10-CM | POA: Diagnosis not present

## 2014-12-17 DIAGNOSIS — M5136 Other intervertebral disc degeneration, lumbar region: Secondary | ICD-10-CM | POA: Insufficient documentation

## 2014-12-17 DIAGNOSIS — M545 Low back pain: Secondary | ICD-10-CM | POA: Diagnosis present

## 2014-12-17 DIAGNOSIS — M19012 Primary osteoarthritis, left shoulder: Secondary | ICD-10-CM

## 2014-12-17 DIAGNOSIS — E1340 Other specified diabetes mellitus with diabetic neuropathy, unspecified: Secondary | ICD-10-CM | POA: Diagnosis not present

## 2014-12-17 DIAGNOSIS — M79604 Pain in right leg: Secondary | ICD-10-CM | POA: Diagnosis present

## 2014-12-17 DIAGNOSIS — M19011 Primary osteoarthritis, right shoulder: Secondary | ICD-10-CM

## 2014-12-17 DIAGNOSIS — M79605 Pain in left leg: Secondary | ICD-10-CM | POA: Diagnosis present

## 2014-12-17 DIAGNOSIS — M792 Neuralgia and neuritis, unspecified: Secondary | ICD-10-CM | POA: Diagnosis not present

## 2014-12-17 DIAGNOSIS — E1140 Type 2 diabetes mellitus with diabetic neuropathy, unspecified: Secondary | ICD-10-CM | POA: Diagnosis not present

## 2014-12-17 DIAGNOSIS — M160 Bilateral primary osteoarthritis of hip: Secondary | ICD-10-CM

## 2014-12-17 MED ORDER — GABAPENTIN 300 MG PO CAPS: ORAL_CAPSULE | ORAL | Status: DC

## 2014-12-17 MED ORDER — HYDROCODONE-ACETAMINOPHEN 5-325 MG PO TABS: ORAL_TABLET | ORAL | Status: DC

## 2014-12-17 NOTE — Progress Notes (Signed)
Discharged to home, ambulatory with hydrocodone script in hand.

## 2014-12-17 NOTE — Progress Notes (Signed)
   Subjective:    Patient ID: Charlotte [name], female    DOB: 11-28-1955, 59 y.o.   MRN: [id number]  HPI  Patient is 59 year old female returns to [place] for further evaluation and treatment of pain involving the lower back lower extremity regions. Patient states that she would like to have procedure for lower back pain which has become increasingly more painful. Patient also has been awaiting optimization of her general medical condition including her diabetes mellitus prior to proceeding with surgical intervention of the shoulder. We informed patient that we [facility] Reining proceed with interventional treatment without the use of steroid and that we could also consider radiofrequency procedures. At this time we will await patient undergo follow-up evaluation with Dr. Ancil Boozer to discuss her diabetes mellitus and general medical condition and we will consider interventional treatment should the patient and Dr. Ancil Boozer which first to do such. Patient will undergo surgical follow-up evaluation of her shoulder as discussed. We will continue present medications as prescribed this time. All understanding and agreement with suggested treatment plan.     Review of Systems     Objective:   Physical Exam  There was mild tenderness of the splenius capitis and occipitalis musculature regions. Palpation of the acromioclavicular glenohumeral joint region was with moderate to moderately severe discomfort with limited range of motion of the shoulder. There was questionable decreased grip strength and Tinel and Phalen's maneuver were without increase of pain of significant degree. There appeared to be unremarkable Spurling's maneuver. Palpation of the thoracic facet thoracic paraspinal musculature region was a tends to palpation of moderate degree with moderate muscle spasms occurring in the thoracic paraspinal musculature region. Palpation of the lumbar paraspinal musculature region lumbar facet region was with  moderate to moderately D severe discomfort with lateral bending and rotation region reproduced severe pain. There was tenderness over the PSIS PII S region as well as the gluteal and piriformis musculature regions. Straight leg raising limited to approximately 20 without increased pain with dorsiflexion noted. There was negative clonus negative Homans. Abdomen was nontender with no costovertebral angle tenderness noted.    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 Patient will undergo follow-up surgical evaluation of shoulder as planned. We will also discussed neurosurgical reevaluation of lower back and lower extremity pain.  F/U neurological evaluation  May consider radiofrequency rhizolysis or intraspinal procedures pending response to present treatment and F/U evaluation   Patient to call [facility] should patient have concerns prior to scheduled return appointmen.

## 2014-12-17 NOTE — Patient Instructions (Addendum)
Continue present medication hydrocodone acetaminophen  F/U PCP Dr. Ancil Boozer for evaliation of  BP and general medical  condition  F/U surgical evaluation of shoulder as discussed  F/U neurological evaluation  May consider radiofrequency rhizolysis or intraspinal procedures pending response to present treatment and F/U evaluation   Patient to call [facility] should patient have concerns prior to scheduled return appointmen. Hydrocodone acetaminophen

## 2014-12-17 NOTE — Progress Notes (Signed)
Safety precautions to be maintained throughout the outpatient stay will include: orient to surroundings, keep bed in low position, maintain call bell within reach at all times, provide assistance with transfer out of bed and ambulation.  

## 2015-01-19 ENCOUNTER — Ambulatory Visit: Payer: Medicare Other | Attending: Pain Medicine | Admitting: Pain Medicine

## 2015-01-19 ENCOUNTER — Encounter: Payer: Self-pay | Admitting: Pain Medicine

## 2015-01-19 VITALS — BP 150/86 | HR 80 | Temp 98.5°F | Resp 18 | Ht 66.0 in | Wt 230.0 lb

## 2015-01-19 DIAGNOSIS — M19011 Primary osteoarthritis, right shoulder: Secondary | ICD-10-CM

## 2015-01-19 DIAGNOSIS — E1340 Other specified diabetes mellitus with diabetic neuropathy, unspecified: Secondary | ICD-10-CM

## 2015-01-19 DIAGNOSIS — E1140 Type 2 diabetes mellitus with diabetic neuropathy, unspecified: Secondary | ICD-10-CM | POA: Diagnosis not present

## 2015-01-19 DIAGNOSIS — M19012 Primary osteoarthritis, left shoulder: Secondary | ICD-10-CM

## 2015-01-19 DIAGNOSIS — M5136 Other intervertebral disc degeneration, lumbar region: Secondary | ICD-10-CM | POA: Insufficient documentation

## 2015-01-19 DIAGNOSIS — M5126 Other intervertebral disc displacement, lumbar region: Secondary | ICD-10-CM | POA: Insufficient documentation

## 2015-01-19 DIAGNOSIS — M5030 Other cervical disc degeneration, unspecified cervical region: Secondary | ICD-10-CM | POA: Insufficient documentation

## 2015-01-19 DIAGNOSIS — M19019 Primary osteoarthritis, unspecified shoulder: Secondary | ICD-10-CM | POA: Insufficient documentation

## 2015-01-19 DIAGNOSIS — M792 Neuralgia and neuritis, unspecified: Secondary | ICD-10-CM | POA: Diagnosis not present

## 2015-01-19 DIAGNOSIS — M545 Low back pain: Secondary | ICD-10-CM | POA: Diagnosis present

## 2015-01-19 DIAGNOSIS — M160 Bilateral primary osteoarthritis of hip: Secondary | ICD-10-CM

## 2015-01-19 DIAGNOSIS — M5137 Other intervertebral disc degeneration, lumbosacral region: Secondary | ICD-10-CM

## 2015-01-19 DIAGNOSIS — M461 Sacroiliitis, not elsewhere classified: Secondary | ICD-10-CM | POA: Diagnosis not present

## 2015-01-19 DIAGNOSIS — M5416 Radiculopathy, lumbar region: Secondary | ICD-10-CM | POA: Diagnosis not present

## 2015-01-19 MED ORDER — GABAPENTIN 300 MG PO CAPS: ORAL_CAPSULE | ORAL | Status: DC

## 2015-01-19 MED ORDER — HYDROCODONE-ACETAMINOPHEN 5-325 MG PO TABS: ORAL_TABLET | ORAL | Status: DC

## 2015-01-19 NOTE — Progress Notes (Signed)
Safety precautions to be maintained throughout the outpatient stay will include: orient to surroundings, keep bed in low position, maintain call bell within reach at all times, provide assistance with transfer out of bed and ambulation.  

## 2015-01-19 NOTE — Patient Instructions (Signed)
PLAN   Continue present medication Neurontin and hydrocodone acetaminophen  F/U PCP Dr.Sowles  for evaliation of  BP and general medical  condition  F/U surgical evaluation of shoulder as planned. May consideradditional surgical evaluations pending  follow-up evaluations  F/U neurological evaluation. May consider pending follow-up evaluations  May consider radiofrequency rhizolysis or intraspinal procedures pending response to present treatment and F/U evaluation   Patient to call [facility] should patient have concerns prior to scheduled return appointment.

## 2015-01-19 NOTE — Progress Notes (Signed)
   Subjective:    Patient ID: Charlotte [name], female    DOB: [birth year], 59 y.o.   MRN: [id number]  HPI  Patient is a 59 year old female returns to pain management for further evaluation and treatment of pain involving the lower back lower extremity region. Patient has had significant increase lower back and lower extremity pain. Patient also is being considered for surgery of the shoulder. At the present time we will await patient to follow-up with Dr.Sowles and we will consider interventional treatment for lower back lower extremity pain and we'll avoid the use of steroids to avoid aggravation of patient's diabetes mellitus. We will continue present medications as prescribed this time. The patient was understanding and agreed suggested treatment plan      Review of Systems     Objective:   Physical Exam  There was tenderness of the splenius capitis and occipitalis region of mild degree with mild tenderness over the cervical facet cervical paraspinal musculature region palpation of the acromioclavicular glenohumeral joint region was with moderate tends to palpation and patient had mild difficulty performing the drop test. Patient appeared to be with bilaterally equal grip strength. Tinel and Phalen's maneuver were without increased pain of significant degree. There was tenderness to palpation over the thoracic facet thoracic paraspinal muscles region of moderate degree. The lower thoracic paraspinal musculature region was with moderately severe tenderness to palpation as well as the lumbar paraspinal musculature region with moderately severe tends to palpation. Straight leg raising was limited approximately 20 without definite increased pain with dorsiflexion there was negative clonus negative Homans. Elicit patient had difficulty relaxing. Abdomen soft nontender no costovertebral angle tenderness was noted.      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 May consider pending follow-up evaluations  May consider radiofrequency rhizolysis or intraspinal procedures pending response to present treatment and F/U evaluation   Patient to call [facility] should patient have concerns prior to scheduled return appointment.

## 2015-01-20 DIAGNOSIS — E1165 Type 2 diabetes mellitus with hyperglycemia: Secondary | ICD-10-CM | POA: Diagnosis not present

## 2015-01-27 DIAGNOSIS — E1142 Type 2 diabetes mellitus with diabetic polyneuropathy: Secondary | ICD-10-CM | POA: Diagnosis not present

## 2015-01-27 DIAGNOSIS — E1165 Type 2 diabetes mellitus with hyperglycemia: Secondary | ICD-10-CM | POA: Diagnosis not present

## 2015-01-27 DIAGNOSIS — R809 Proteinuria, unspecified: Secondary | ICD-10-CM | POA: Diagnosis not present

## 2015-01-27 DIAGNOSIS — Z794 Long term (current) use of insulin: Secondary | ICD-10-CM | POA: Diagnosis not present

## 2015-02-02 ENCOUNTER — Other Ambulatory Visit: Payer: Self-pay | Admitting: Pain Medicine

## 2015-02-02 DIAGNOSIS — G8929 Other chronic pain: Secondary | ICD-10-CM | POA: Diagnosis not present

## 2015-02-02 DIAGNOSIS — R1013 Epigastric pain: Secondary | ICD-10-CM | POA: Diagnosis not present

## 2015-02-05 ENCOUNTER — Other Ambulatory Visit: Payer: Self-pay | Admitting: Family Medicine

## 2015-02-05 NOTE — Telephone Encounter (Signed)
Patient requesting refill. 

## 2015-02-08 ENCOUNTER — Ambulatory Visit: Payer: Medicare Other | Admitting: Family Medicine

## 2015-02-18 ENCOUNTER — Ambulatory Visit: Payer: Medicare Other | Attending: Pain Medicine | Admitting: Pain Medicine

## 2015-02-18 ENCOUNTER — Encounter: Payer: Self-pay | Admitting: Pain Medicine

## 2015-02-18 VITALS — BP 148/90 | HR 79 | Temp 98.4°F | Resp 16 | Ht 66.0 in | Wt 220.0 lb

## 2015-02-18 DIAGNOSIS — M545 Low back pain: Secondary | ICD-10-CM | POA: Diagnosis present

## 2015-02-18 DIAGNOSIS — M5030 Other cervical disc degeneration, unspecified cervical region: Secondary | ICD-10-CM | POA: Insufficient documentation

## 2015-02-18 DIAGNOSIS — M19012 Primary osteoarthritis, left shoulder: Secondary | ICD-10-CM

## 2015-02-18 DIAGNOSIS — E1140 Type 2 diabetes mellitus with diabetic neuropathy, unspecified: Secondary | ICD-10-CM | POA: Diagnosis not present

## 2015-02-18 DIAGNOSIS — M792 Neuralgia and neuritis, unspecified: Secondary | ICD-10-CM | POA: Diagnosis not present

## 2015-02-18 DIAGNOSIS — E1340 Other specified diabetes mellitus with diabetic neuropathy, unspecified: Secondary | ICD-10-CM

## 2015-02-18 DIAGNOSIS — M79605 Pain in left leg: Secondary | ICD-10-CM | POA: Diagnosis present

## 2015-02-18 DIAGNOSIS — M159 Polyosteoarthritis, unspecified: Secondary | ICD-10-CM

## 2015-02-18 DIAGNOSIS — M160 Bilateral primary osteoarthritis of hip: Secondary | ICD-10-CM

## 2015-02-18 DIAGNOSIS — M19019 Primary osteoarthritis, unspecified shoulder: Secondary | ICD-10-CM | POA: Diagnosis not present

## 2015-02-18 DIAGNOSIS — M5137 Other intervertebral disc degeneration, lumbosacral region: Secondary | ICD-10-CM

## 2015-02-18 DIAGNOSIS — M461 Sacroiliitis, not elsewhere classified: Secondary | ICD-10-CM | POA: Diagnosis not present

## 2015-02-18 DIAGNOSIS — M5136 Other intervertebral disc degeneration, lumbar region: Secondary | ICD-10-CM | POA: Diagnosis not present

## 2015-02-18 DIAGNOSIS — M5416 Radiculopathy, lumbar region: Secondary | ICD-10-CM | POA: Diagnosis not present

## 2015-02-18 DIAGNOSIS — M5126 Other intervertebral disc displacement, lumbar region: Secondary | ICD-10-CM | POA: Insufficient documentation

## 2015-02-18 DIAGNOSIS — M79604 Pain in right leg: Secondary | ICD-10-CM | POA: Diagnosis present

## 2015-02-18 DIAGNOSIS — M4806 Spinal stenosis, lumbar region: Secondary | ICD-10-CM | POA: Diagnosis not present

## 2015-02-18 DIAGNOSIS — M19011 Primary osteoarthritis, right shoulder: Secondary | ICD-10-CM

## 2015-02-18 DIAGNOSIS — M150 Primary generalized (osteo)arthritis: Secondary | ICD-10-CM

## 2015-02-18 MED ORDER — HYDROCODONE-ACETAMINOPHEN 5-325 MG PO TABS: ORAL_TABLET | ORAL | Status: DC

## 2015-02-18 MED ORDER — GABAPENTIN 300 MG PO CAPS: ORAL_CAPSULE | ORAL | Status: DC

## 2015-02-18 NOTE — Progress Notes (Signed)
   Subjective:    Patient ID: Charlotte [name], female    DOB: Dec 09, 1955, 59 y.o.   MRN: [id number]  HPI Patient is 59 year old female who [facility] evaluation and treatment of pain involving the lower back lower extremity regions. Patient also to pain involving the region of the shoulder and is scheduled to undergo surgical intervention of the left shoulder. Patient denies recent trauma change in events of daily living the call significant change in symptomatology. Patient admits to lower back lower extremity pain and states that the present time she is continuing to work on her diabetes to get it under good control to undergo surgery of the left shoulder. This patient's condition and will continue medications Neurontin and hydrocodone acetaminophen. We also discussed patient being able to undergo radiofrequency rhizolysis lumbar facet, medial branch nerves for relief of lower back lower extremity pain without use of steroids and without any effect on patient's diabetes we will continue with non-interventional treatment at this time and patient will inform us if she wish to consider radiofrequency rhizolysis in attempt to decrease lumbar lower extremity pain paresthesias. All were understanding and in agreement status treatment plan      Review of Systems     Objective:   Physical Exam  There was tends to palpation of the splenius capitis and occipitalis musculature region of moderate degree left greater than right. There was tenderness of the trapezius levator scapula and rhomboid musculature region on the left greater than the right. There appeared to be unremarkable Spurling's maneuver. Tinel and Phalen's maneuver were without increased pain of any significant degree. Patient was with decreased grip strength and   there was tenderness to palpation of the acromioclavicular and glenohumeral joint region left greater than the right. Palpation over the thoracic  facet thoracic paraspinal musculature was a tends to palpation of moderate degree. No crepitus of the thoracic region was noted. Palpation over the lumbar paraspinal musculature region lumbar facet region associated with moderate discomfort. Lateral bending and rotation and extension and palpation of the lumbar facets reproduce moderate discomfort. Straight leg raising was tolerates approximately 20 without a definite increased pain with dorsiflexion noted. There was no definite sensory deficit of dermatomal distribution detected. There appeared to be negative clonus negative Homans. Palpation of the PSIS and PII S regions reproduced moderate discomfort. There was moderate tends to palpation of the greater trochanteric region and iliotibial band region as well. DTRs were difficult to elicit patient had difficulty relaxing. There was negative clonus negative Homans. Abdomen was nontender with no costovertebral angle tenderness noted.      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 May consider pending follow-up evaluations  May consider radiofrequency rhizolysis or intraspinal procedures pending response to present treatment and F/U evaluation   Patient to call [facility] should patient have concerns prior to scheduled return appointment.

## 2015-02-18 NOTE — Progress Notes (Signed)
Safety precautions to be maintained throughout the outpatient stay will include: orient to surroundings, keep bed in low position, maintain call bell within reach at all times, provide assistance with transfer out of bed and ambulation.  

## 2015-02-18 NOTE — Patient Instructions (Signed)
PLAN   Continue present medication Neurontin and hydrocodone acetaminophen  F/U PCP Dr. Ancil Boozer  for evaliation of  BP and general medical  condition  F/U surgical evaluation. Follow-up as planned  F/U neurological evaluation. May consider pending follow-up evaluations  May consider radiofrequency rhizolysis or intraspinal procedures pending response to present treatment and F/U evaluation   Patient to call [facility] should patient have concerns prior to scheduled return appointment.

## 2015-03-04 DIAGNOSIS — Z794 Long term (current) use of insulin: Secondary | ICD-10-CM | POA: Diagnosis not present

## 2015-03-04 DIAGNOSIS — Z1159 Encounter for screening for other viral diseases: Secondary | ICD-10-CM | POA: Diagnosis not present

## 2015-03-04 DIAGNOSIS — Z23 Encounter for immunization: Secondary | ICD-10-CM | POA: Diagnosis not present

## 2015-03-04 DIAGNOSIS — R718 Other abnormality of red blood cells: Secondary | ICD-10-CM | POA: Diagnosis not present

## 2015-03-04 DIAGNOSIS — E1165 Type 2 diabetes mellitus with hyperglycemia: Secondary | ICD-10-CM | POA: Diagnosis not present

## 2015-03-04 DIAGNOSIS — M0609 Rheumatoid arthritis without rheumatoid factor, multiple sites: Secondary | ICD-10-CM | POA: Diagnosis not present

## 2015-03-04 DIAGNOSIS — R809 Proteinuria, unspecified: Secondary | ICD-10-CM | POA: Diagnosis not present

## 2015-03-04 DIAGNOSIS — Z114 Encounter for screening for human immunodeficiency virus [HIV]: Secondary | ICD-10-CM | POA: Diagnosis not present

## 2015-03-04 DIAGNOSIS — E1129 Type 2 diabetes mellitus with other diabetic kidney complication: Secondary | ICD-10-CM | POA: Diagnosis not present

## 2015-03-04 DIAGNOSIS — E559 Vitamin D deficiency, unspecified: Secondary | ICD-10-CM | POA: Diagnosis not present

## 2015-03-04 DIAGNOSIS — E538 Deficiency of other specified B group vitamins: Secondary | ICD-10-CM | POA: Diagnosis not present

## 2015-03-05 ENCOUNTER — Ambulatory Visit: Payer: Medicare Other | Admitting: Family Medicine

## 2015-03-18 ENCOUNTER — Encounter: Payer: Self-pay | Admitting: Pain Medicine

## 2015-03-18 ENCOUNTER — Ambulatory Visit: Payer: Medicare Other | Attending: Pain Medicine | Admitting: Pain Medicine

## 2015-03-18 VITALS — BP 150/86 | HR 79 | Temp 96.9°F | Resp 14 | Ht 66.0 in | Wt 233.0 lb

## 2015-03-18 DIAGNOSIS — M461 Sacroiliitis, not elsewhere classified: Secondary | ICD-10-CM | POA: Diagnosis not present

## 2015-03-18 DIAGNOSIS — M51379 Other intervertebral disc degeneration, lumbosacral region without mention of lumbar back pain or lower extremity pain: Secondary | ICD-10-CM

## 2015-03-18 DIAGNOSIS — M4806 Spinal stenosis, lumbar region: Secondary | ICD-10-CM | POA: Diagnosis not present

## 2015-03-18 DIAGNOSIS — M19011 Primary osteoarthritis, right shoulder: Secondary | ICD-10-CM

## 2015-03-18 DIAGNOSIS — M545 Low back pain: Secondary | ICD-10-CM | POA: Diagnosis present

## 2015-03-18 DIAGNOSIS — M19012 Primary osteoarthritis, left shoulder: Secondary | ICD-10-CM

## 2015-03-18 DIAGNOSIS — E1140 Type 2 diabetes mellitus with diabetic neuropathy, unspecified: Secondary | ICD-10-CM | POA: Diagnosis not present

## 2015-03-18 DIAGNOSIS — E1340 Other specified diabetes mellitus with diabetic neuropathy, unspecified: Secondary | ICD-10-CM

## 2015-03-18 DIAGNOSIS — M5030 Other cervical disc degeneration, unspecified cervical region: Secondary | ICD-10-CM | POA: Diagnosis not present

## 2015-03-18 DIAGNOSIS — M5136 Other intervertebral disc degeneration, lumbar region: Secondary | ICD-10-CM | POA: Diagnosis not present

## 2015-03-18 DIAGNOSIS — M19019 Primary osteoarthritis, unspecified shoulder: Secondary | ICD-10-CM | POA: Diagnosis not present

## 2015-03-18 DIAGNOSIS — M150 Primary generalized (osteo)arthritis: Secondary | ICD-10-CM

## 2015-03-18 DIAGNOSIS — M792 Neuralgia and neuritis, unspecified: Secondary | ICD-10-CM | POA: Diagnosis not present

## 2015-03-18 DIAGNOSIS — M159 Polyosteoarthritis, unspecified: Secondary | ICD-10-CM

## 2015-03-18 DIAGNOSIS — M542 Cervicalgia: Secondary | ICD-10-CM | POA: Diagnosis present

## 2015-03-18 DIAGNOSIS — M160 Bilateral primary osteoarthritis of hip: Secondary | ICD-10-CM

## 2015-03-18 DIAGNOSIS — M5137 Other intervertebral disc degeneration, lumbosacral region: Secondary | ICD-10-CM

## 2015-03-18 DIAGNOSIS — M5416 Radiculopathy, lumbar region: Secondary | ICD-10-CM | POA: Diagnosis not present

## 2015-03-18 MED ORDER — GABAPENTIN 300 MG PO CAPS: ORAL_CAPSULE | ORAL | Status: DC

## 2015-03-18 MED ORDER — HYDROCODONE-ACETAMINOPHEN 5-325 MG PO TABS: ORAL_TABLET | ORAL | Status: DC

## 2015-03-18 NOTE — Progress Notes (Signed)
Safety precautions to be maintained throughout the outpatient stay will include: orient to surroundings, keep bed in low position, maintain call bell within reach at all times, provide assistance with transfer out of bed and ambulation.  

## 2015-03-18 NOTE — Patient Instructions (Addendum)
PLAN   Continue present medication Neurontin and hydrocodone acetaminophen  F/U PCP Dr. Ancil Boozer  for evaliation of  BP and general medical  condition  F/U surgical evaluation. Follow-up as planned  F/U neurological evaluation. May consider pending follow-up evaluations  May consider radiofrequency rhizolysis or intraspinal procedures pending response to present treatment and F/U evaluation   Patient to call [facility] should patient have concerns prior to scheduled return appointment.  A prescription for GABAPENTIN was sent to your pharmacy and should be available for pickup today. A prescription for HYDROCODONE was given to you today.

## 2015-03-18 NOTE — Progress Notes (Signed)
   Subjective:    Patient ID: Charlotte [name], female    DOB: 1955-11-03, 59 y.o.   MRN: [id number]  HPI  Patient is a 59 year old female who returns to pain management for further evaluation and treatment of pain involving the neck, extremity reaching shoulder lower back and lower extremity region. Patient continues to undergo evaluation and treatment with primary care physician prior to proceeding with surgery of the shoulder. Patient has diabetes mellitus and is trying to obtain optimal control of her diabetes prior to surgery of the shoulder. We will continue noninterventional treatment M Will consider patient for modification of treatment pending follow-up evaluation. Patient. B tolerate Neurontin and hydrocodone acetaminophen very well without undesirable side effects.   Review of Systems     Objective:   Physical Exam   there was tenderness to palpation of the splenius capitis N subthalamus musculature is mild to moderate degree. There was tenderness of the acromial clavicular and glenohumeral joint region is a moderate degree. Patient had difficulty performing drop test. Tinel and Phalen maneuver without increased pain of significant degree. Patient was with decreased grip strength. Palpation of the thoracic facet the right paraspinal musculature reproduces moderate discomfort. No crepitus of the thoracic region was noted. Palpation of the lumbar region lumbar paraspinal muscles reproduces pain of mild-to-moderate degree. There was mild to moderate tenderness of the PSIS NP at S regions. Mountain's of the greater trochanteric region iliotibial band region. Straight leg raising tolerated to 20 without increase of pain with dorsiflexion noted. Negative clonus negative Homans. 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 May consider pending follow-up evaluations  May consider radiofrequency rhizolysis or intraspinal procedures pending response to present treatment and F/U evaluation   Patient to call [facility] should patient have concerns prior to scheduled return appointment.

## 2015-03-23 DIAGNOSIS — J410 Simple chronic bronchitis: Secondary | ICD-10-CM | POA: Diagnosis not present

## 2015-03-23 DIAGNOSIS — Z0000 Encounter for general adult medical examination without abnormal findings: Secondary | ICD-10-CM | POA: Diagnosis not present

## 2015-03-29 DIAGNOSIS — H52223 Regular astigmatism, bilateral: Secondary | ICD-10-CM | POA: Diagnosis not present

## 2015-03-29 DIAGNOSIS — E109 Type 1 diabetes mellitus without complications: Secondary | ICD-10-CM | POA: Diagnosis not present

## 2015-03-29 DIAGNOSIS — H524 Presbyopia: Secondary | ICD-10-CM | POA: Diagnosis not present

## 2015-03-29 DIAGNOSIS — I10 Essential (primary) hypertension: Secondary | ICD-10-CM | POA: Diagnosis not present

## 2015-03-29 DIAGNOSIS — E119 Type 2 diabetes mellitus without complications: Secondary | ICD-10-CM | POA: Diagnosis not present

## 2015-03-29 DIAGNOSIS — H5203 Hypermetropia, bilateral: Secondary | ICD-10-CM | POA: Diagnosis not present

## 2015-04-19 ENCOUNTER — Ambulatory Visit: Payer: Medicare Other | Attending: Pain Medicine | Admitting: Pain Medicine

## 2015-04-19 ENCOUNTER — Encounter: Payer: Self-pay | Admitting: Pain Medicine

## 2015-04-19 VITALS — BP 128/80 | HR 70 | Temp 98.6°F | Resp 16 | Ht 66.0 in | Wt 235.0 lb

## 2015-04-19 DIAGNOSIS — M150 Primary generalized (osteo)arthritis: Secondary | ICD-10-CM

## 2015-04-19 DIAGNOSIS — M160 Bilateral primary osteoarthritis of hip: Secondary | ICD-10-CM

## 2015-04-19 DIAGNOSIS — E1340 Other specified diabetes mellitus with diabetic neuropathy, unspecified: Secondary | ICD-10-CM | POA: Diagnosis not present

## 2015-04-19 DIAGNOSIS — M19012 Primary osteoarthritis, left shoulder: Secondary | ICD-10-CM

## 2015-04-19 DIAGNOSIS — M5030 Other cervical disc degeneration, unspecified cervical region: Secondary | ICD-10-CM | POA: Diagnosis not present

## 2015-04-19 DIAGNOSIS — M5126 Other intervertebral disc displacement, lumbar region: Secondary | ICD-10-CM | POA: Insufficient documentation

## 2015-04-19 DIAGNOSIS — M19019 Primary osteoarthritis, unspecified shoulder: Secondary | ICD-10-CM | POA: Insufficient documentation

## 2015-04-19 DIAGNOSIS — M159 Polyosteoarthritis, unspecified: Secondary | ICD-10-CM

## 2015-04-19 DIAGNOSIS — M25512 Pain in left shoulder: Secondary | ICD-10-CM | POA: Diagnosis present

## 2015-04-19 DIAGNOSIS — M5136 Other intervertebral disc degeneration, lumbar region: Secondary | ICD-10-CM | POA: Diagnosis not present

## 2015-04-19 DIAGNOSIS — M542 Cervicalgia: Secondary | ICD-10-CM | POA: Diagnosis present

## 2015-04-19 DIAGNOSIS — E1140 Type 2 diabetes mellitus with diabetic neuropathy, unspecified: Secondary | ICD-10-CM | POA: Insufficient documentation

## 2015-04-19 DIAGNOSIS — M4806 Spinal stenosis, lumbar region: Secondary | ICD-10-CM | POA: Insufficient documentation

## 2015-04-19 DIAGNOSIS — M461 Sacroiliitis, not elsewhere classified: Secondary | ICD-10-CM | POA: Diagnosis not present

## 2015-04-19 DIAGNOSIS — M5137 Other intervertebral disc degeneration, lumbosacral region: Secondary | ICD-10-CM

## 2015-04-19 DIAGNOSIS — M5416 Radiculopathy, lumbar region: Secondary | ICD-10-CM | POA: Diagnosis not present

## 2015-04-19 DIAGNOSIS — M19011 Primary osteoarthritis, right shoulder: Secondary | ICD-10-CM

## 2015-04-19 DIAGNOSIS — M792 Neuralgia and neuritis, unspecified: Secondary | ICD-10-CM | POA: Diagnosis not present

## 2015-04-19 DIAGNOSIS — M51379 Other intervertebral disc degeneration, lumbosacral region without mention of lumbar back pain or lower extremity pain: Secondary | ICD-10-CM

## 2015-04-19 MED ORDER — GABAPENTIN 300 MG PO CAPS: ORAL_CAPSULE | ORAL | Status: DC

## 2015-04-19 MED ORDER — HYDROCODONE-ACETAMINOPHEN 5-325 MG PO TABS: ORAL_TABLET | ORAL | Status: DC

## 2015-04-19 NOTE — Progress Notes (Signed)
   Subjective:    Patient ID: Charlotte [name], female    DOB: [birth year], 59 y.o.   MRN: [id number]  HPI  The patient is a 59 year old female who returns to pain management for further evaluation and treatment of pain involving the neck upper extremity regions shoulder especially the left shoulder. Patient continues and care of Dr.Krasinski for evaluation and treatment of shoulder. The patient is pending surgical intervention of the shoulder. At the present time patient continues under the care of her primary care physician Dr.Sowles optimization of patient's diabetes mellitus. The patient denies any trauma change in events of daily living the cost change in symptomatology. We discussed patient's condition and will continue medications Neurontin and hydrocodone acetaminophen as prescribed and we'll remain available to consider modification of treatment pending follow-up evaluation. The patient is to call pain management should they be change in condition prior to scheduled return appointment. The patient agreed to suggested treatment plan. Patient states the pain is fairly well controlled present treatment regimen in terms of pain of the cervical upper extremity region and lower back and lower extremity regions. We remain available to modified treatment regimen pending follow-up evaluation. Patient agreed to suggested treatment plan        Review of Systems     Objective:   Physical Exam  There was tenderness to palpation of the splenius capitate and occipitalis region of mild degree. The patient was with limited range of motion of the left shoulder. Patient was with mild difficulty performing drop test. There was tenderness over the region of the cervical facet cervical paraspinal musculature region as well as the acromioclavicular and glenohumeral joint region. There appeared to be unremarkable Spurling's maneuver. Grip strength was questionably decreased. Tinel and Phalen's maneuver were without  increased pain of significant degree. Palpation over the region of the thoracic facet thoracic paraspinal musculature region associated with mild discomfort. There was mild tenderness to palpation over the lower thoracic paraspinal musculature region with mild muscle spasm noted. Palpation over the lumbar paraspinal musculatures and lumbar facet region was with moderate discomfort. Lateral bending rotation extension and palpation of the lumbar facets reproduce moderate discomfort. Straight leg raise was tolerates approximately 30 without a definite increased pain with dorsiflexion noted. There was negative clonus negative Homans. There was tenderness over the PSIS and PII S region of moderate degree. There was mild tenderness of the greater trochanteric region iliotibial band region. The abdomen was nontender and no costovertebral tenderness was noted.      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 Ancil Boozer  for evaliation of  BP and general medical  condition  F/U surgical evaluation. Follow-up as planned with Dr. Mack Guise  F/U neurological evaluation. May consider pending follow-up evaluations  May consider radiofrequency rhizolysis or intraspinal procedures pending response to present treatment and F/U evaluation   Patient to call [facility] should patient have concerns prior to scheduled return appointment.

## 2015-04-19 NOTE — Patient Instructions (Addendum)
PLAN   Continue present medication Neurontin and hydrocodone acetaminophen  F/U PCP Dr. Ancil Boozer  for evaliation of  BP and general medical  condition  F/U surgical evaluation. Follow-up as planned with Dr. Mack Guise  F/U neurological evaluation. May consider pending follow-up evaluations  May consider radiofrequency rhizolysis or intraspinal procedures pending response to present treatment and F/U evaluation   Patient to call [facility] should patient have concerns prior to scheduled return appointment.

## 2015-04-30 ENCOUNTER — Other Ambulatory Visit: Payer: Self-pay | Admitting: Pain Medicine

## 2015-05-07 DIAGNOSIS — R809 Proteinuria, unspecified: Secondary | ICD-10-CM | POA: Diagnosis not present

## 2015-05-07 DIAGNOSIS — Z794 Long term (current) use of insulin: Secondary | ICD-10-CM | POA: Diagnosis not present

## 2015-05-07 DIAGNOSIS — E1165 Type 2 diabetes mellitus with hyperglycemia: Secondary | ICD-10-CM | POA: Diagnosis not present

## 2015-05-14 DIAGNOSIS — R809 Proteinuria, unspecified: Secondary | ICD-10-CM | POA: Diagnosis not present

## 2015-05-14 DIAGNOSIS — E1142 Type 2 diabetes mellitus with diabetic polyneuropathy: Secondary | ICD-10-CM | POA: Diagnosis not present

## 2015-05-14 DIAGNOSIS — I10 Essential (primary) hypertension: Secondary | ICD-10-CM | POA: Diagnosis not present

## 2015-05-14 DIAGNOSIS — E1129 Type 2 diabetes mellitus with other diabetic kidney complication: Secondary | ICD-10-CM | POA: Diagnosis not present

## 2015-05-14 DIAGNOSIS — F4320 Adjustment disorder, unspecified: Secondary | ICD-10-CM | POA: Diagnosis not present

## 2015-05-14 DIAGNOSIS — E1165 Type 2 diabetes mellitus with hyperglycemia: Secondary | ICD-10-CM | POA: Diagnosis not present

## 2015-05-14 DIAGNOSIS — Z794 Long term (current) use of insulin: Secondary | ICD-10-CM | POA: Diagnosis not present

## 2015-05-16 DIAGNOSIS — K760 Fatty (change of) liver, not elsewhere classified: Secondary | ICD-10-CM

## 2015-05-16 HISTORY — DX: Fatty (change of) liver, not elsewhere classified: K76.0

## 2015-05-18 ENCOUNTER — Ambulatory Visit: Payer: Medicare Other | Attending: Pain Medicine | Admitting: Pain Medicine

## 2015-05-18 VITALS — BP 157/90 | HR 83 | Temp 98.4°F | Resp 16 | Wt 231.0 lb

## 2015-05-18 DIAGNOSIS — M150 Primary generalized (osteo)arthritis: Secondary | ICD-10-CM

## 2015-05-18 DIAGNOSIS — M5416 Radiculopathy, lumbar region: Secondary | ICD-10-CM | POA: Diagnosis not present

## 2015-05-18 DIAGNOSIS — M159 Polyosteoarthritis, unspecified: Secondary | ICD-10-CM

## 2015-05-18 DIAGNOSIS — M5126 Other intervertebral disc displacement, lumbar region: Secondary | ICD-10-CM | POA: Diagnosis not present

## 2015-05-18 DIAGNOSIS — E1340 Other specified diabetes mellitus with diabetic neuropathy, unspecified: Secondary | ICD-10-CM | POA: Diagnosis not present

## 2015-05-18 DIAGNOSIS — M4806 Spinal stenosis, lumbar region: Secondary | ICD-10-CM | POA: Diagnosis not present

## 2015-05-18 DIAGNOSIS — E1140 Type 2 diabetes mellitus with diabetic neuropathy, unspecified: Secondary | ICD-10-CM | POA: Insufficient documentation

## 2015-05-18 DIAGNOSIS — M25511 Pain in right shoulder: Secondary | ICD-10-CM | POA: Diagnosis present

## 2015-05-18 DIAGNOSIS — M19011 Primary osteoarthritis, right shoulder: Secondary | ICD-10-CM

## 2015-05-18 DIAGNOSIS — M160 Bilateral primary osteoarthritis of hip: Secondary | ICD-10-CM

## 2015-05-18 DIAGNOSIS — M542 Cervicalgia: Secondary | ICD-10-CM | POA: Diagnosis present

## 2015-05-18 DIAGNOSIS — M5136 Other intervertebral disc degeneration, lumbar region: Secondary | ICD-10-CM | POA: Diagnosis not present

## 2015-05-18 DIAGNOSIS — M5030 Other cervical disc degeneration, unspecified cervical region: Secondary | ICD-10-CM | POA: Insufficient documentation

## 2015-05-18 DIAGNOSIS — M19012 Primary osteoarthritis, left shoulder: Secondary | ICD-10-CM

## 2015-05-18 DIAGNOSIS — M461 Sacroiliitis, not elsewhere classified: Secondary | ICD-10-CM | POA: Diagnosis not present

## 2015-05-18 DIAGNOSIS — M5137 Other intervertebral disc degeneration, lumbosacral region: Secondary | ICD-10-CM

## 2015-05-18 DIAGNOSIS — M792 Neuralgia and neuritis, unspecified: Secondary | ICD-10-CM | POA: Diagnosis not present

## 2015-05-18 DIAGNOSIS — M25512 Pain in left shoulder: Secondary | ICD-10-CM | POA: Diagnosis present

## 2015-05-18 DIAGNOSIS — M19019 Primary osteoarthritis, unspecified shoulder: Secondary | ICD-10-CM | POA: Insufficient documentation

## 2015-05-18 MED ORDER — GABAPENTIN 300 MG PO CAPS: ORAL_CAPSULE | ORAL | Status: DC

## 2015-05-18 MED ORDER — HYDROCODONE-ACETAMINOPHEN 5-325 MG PO TABS: ORAL_TABLET | ORAL | Status: DC

## 2015-05-18 NOTE — Patient Instructions (Signed)
PLAN   Continue present medication Neurontin and hydrocodone acetaminophen  F/U PCP Dr. Ancil Boozer  for evaliation of  BP diabetes mellitus and general medical  condition  F/U surgical evaluation. Follow-up as planned with Dr. Mack Guise  F/U neurological evaluation. May consider pending follow-up evaluations  May consider radiofrequency rhizolysis or intraspinal procedures pending response to present treatment and F/U evaluation   Patient to call [facility] should patient have concerns prior to scheduled return appointment.

## 2015-05-18 NOTE — Progress Notes (Signed)
Subjective:    Patient ID: Charlotte [name], female    DOB: 05/23/1955, 60 y.o.   MRN: [id number]  HPI  The patient is a 60 year old female who returns to pain management for further evaluation and treatment of pain involving the shoulders neck entire back and lower extremity regions. The patient is without plans for immediate surgery of the shoulder at this time. The patient will follow-up with Dr. Mack Guise for further evaluation of her shoulder with consideration being given to surgical intervention. The patient continues to adhere to her regimen in attempt to decrease her hemoglobin A1c prior to surgery of the shoulder. The patient will follow-up Dr. Ancil Boozer for evaluation of blood pressure diabetes mellitus and general medical condition as discussed and we will continue patient's Neurontin and hydrocodone acetaminophen as planned at this time. The patient is in agreement with suggested treatment plan. The patient denies trauma change in events of daily living to cause change in symptomatology. The pain of the shoulder is aggravated by reaching and lifting pushing pulling maneuvers. The lower back lower extremity pain is aggravated by standing walking twisting turning maneuvers. The pain of the shoulders and lower back lower extremity region interferes with patient ability to obtain restful sleep. The patient prefers to avoid interventional treatment at this time. We will continue Neurontin and hydrocodone acetaminophen as prescribed and will consider modifications of treatment regimen pending follow-up assessment of patient's All agreed with suggested treatment plan      Review of Systems     Objective:   Physical Exam  There was mild tinnitus to palpation of the splenius capitis and occipitalis musculature region. There was mild tenderness to palpation of the cervical facet cervical paraspinal musculature region. There was mild tenderness to palpation of the thoracic facet thoracic paraspinal  musculature region. No crepitus of the thoracic region was noted. There appeared to be unremarkable Spurling's maneuver. Tinel and Phalen's maneuver were without increased pain of significant degree and patient appeared to be with bilaterally equal grip strength. Palpation over the lumbar paraspinal musculature region lumbar facet region was with moderate tenderness to palpation with lateral bending rotation extension and palpation of the lumbar facets reproducing moderate discomfort. There was tenderness to palpation over the PSIS and P IIS region a moderate degree. Palpation of the greater trochanteric region and iliotibial band region reproduced mild discomfort. Straight leg raise was tolerates approximately 20 without a definite increase of pain with dorsiflexion noted. EHL strength appeared to be slightly decreased. No definite sensory deficit or dermatomal dystrophy detected. There was negative clonus negative Homans. DTRs were difficult to elicit appeared to be trace at the knees. The abdomen was nontender with no costovertebral angle tenderness noted.      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 Ancil Boozer  for evaliation of  BP diabetes mellitus and general medical  condition  F/U surgical evaluation. Follow-up as planned with Dr. Mack Guise  F/U neurological evaluation. May consider pending follow-up evaluations  May consider radiofrequency rhizolysis or intraspinal procedures pending response to present treatment and F/U evaluation   [facility]  should patient have concerns prior to scheduled return appointment.

## 2015-05-18 NOTE — Progress Notes (Signed)
Safety precautions to be maintained throughout the outpatient stay will include: orient to surroundings, keep bed in low position, maintain call bell within reach at all times, provide assistance with transfer out of bed and ambulation.  

## 2015-05-19 DIAGNOSIS — E559 Vitamin D deficiency, unspecified: Secondary | ICD-10-CM | POA: Diagnosis not present

## 2015-06-03 DIAGNOSIS — F331 Major depressive disorder, recurrent, moderate: Secondary | ICD-10-CM | POA: Insufficient documentation

## 2015-06-03 DIAGNOSIS — Z794 Long term (current) use of insulin: Secondary | ICD-10-CM | POA: Diagnosis not present

## 2015-06-03 DIAGNOSIS — E1165 Type 2 diabetes mellitus with hyperglycemia: Secondary | ICD-10-CM | POA: Diagnosis not present

## 2015-06-03 DIAGNOSIS — R809 Proteinuria, unspecified: Secondary | ICD-10-CM | POA: Diagnosis not present

## 2015-06-03 DIAGNOSIS — E1129 Type 2 diabetes mellitus with other diabetic kidney complication: Secondary | ICD-10-CM | POA: Diagnosis not present

## 2015-06-03 DIAGNOSIS — J410 Simple chronic bronchitis: Secondary | ICD-10-CM | POA: Diagnosis not present

## 2015-06-17 ENCOUNTER — Ambulatory Visit: Payer: Medicare Other | Attending: Pain Medicine | Admitting: Pain Medicine

## 2015-06-17 ENCOUNTER — Encounter: Payer: Self-pay | Admitting: Pain Medicine

## 2015-06-17 VITALS — BP 142/81 | HR 83 | Temp 98.3°F | Resp 16 | Ht 66.0 in | Wt 234.0 lb

## 2015-06-17 DIAGNOSIS — M19019 Primary osteoarthritis, unspecified shoulder: Secondary | ICD-10-CM | POA: Insufficient documentation

## 2015-06-17 DIAGNOSIS — E1340 Other specified diabetes mellitus with diabetic neuropathy, unspecified: Secondary | ICD-10-CM | POA: Diagnosis not present

## 2015-06-17 DIAGNOSIS — M5416 Radiculopathy, lumbar region: Secondary | ICD-10-CM | POA: Diagnosis not present

## 2015-06-17 DIAGNOSIS — M19011 Primary osteoarthritis, right shoulder: Secondary | ICD-10-CM

## 2015-06-17 DIAGNOSIS — M25512 Pain in left shoulder: Secondary | ICD-10-CM | POA: Diagnosis present

## 2015-06-17 DIAGNOSIS — M150 Primary generalized (osteo)arthritis: Secondary | ICD-10-CM

## 2015-06-17 DIAGNOSIS — M461 Sacroiliitis, not elsewhere classified: Secondary | ICD-10-CM | POA: Diagnosis not present

## 2015-06-17 DIAGNOSIS — M5126 Other intervertebral disc displacement, lumbar region: Secondary | ICD-10-CM | POA: Diagnosis not present

## 2015-06-17 DIAGNOSIS — M19012 Primary osteoarthritis, left shoulder: Secondary | ICD-10-CM

## 2015-06-17 DIAGNOSIS — M542 Cervicalgia: Secondary | ICD-10-CM | POA: Diagnosis present

## 2015-06-17 DIAGNOSIS — M4806 Spinal stenosis, lumbar region: Secondary | ICD-10-CM | POA: Diagnosis not present

## 2015-06-17 DIAGNOSIS — E1140 Type 2 diabetes mellitus with diabetic neuropathy, unspecified: Secondary | ICD-10-CM | POA: Insufficient documentation

## 2015-06-17 DIAGNOSIS — M5030 Other cervical disc degeneration, unspecified cervical region: Secondary | ICD-10-CM | POA: Insufficient documentation

## 2015-06-17 DIAGNOSIS — M25511 Pain in right shoulder: Secondary | ICD-10-CM | POA: Diagnosis present

## 2015-06-17 DIAGNOSIS — M160 Bilateral primary osteoarthritis of hip: Secondary | ICD-10-CM

## 2015-06-17 DIAGNOSIS — M792 Neuralgia and neuritis, unspecified: Secondary | ICD-10-CM | POA: Diagnosis not present

## 2015-06-17 DIAGNOSIS — M5137 Other intervertebral disc degeneration, lumbosacral region: Secondary | ICD-10-CM

## 2015-06-17 DIAGNOSIS — M159 Polyosteoarthritis, unspecified: Secondary | ICD-10-CM

## 2015-06-17 MED ORDER — HYDROCODONE-ACETAMINOPHEN 5-325 MG PO TABS: ORAL_TABLET | ORAL | Status: DC

## 2015-06-17 MED ORDER — GABAPENTIN 300 MG PO CAPS: ORAL_CAPSULE | ORAL | Status: DC

## 2015-06-17 NOTE — Progress Notes (Signed)
Safety precautions to be maintained throughout the outpatient stay will include: orient to surroundings, keep bed in low position, maintain call bell within reach at all times, provide assistance with transfer out of bed and ambulation.  

## 2015-06-17 NOTE — Patient Instructions (Signed)
PLAN   Continue present medication Neurontin and hydrocodone acetaminophen  F/U PCP Dr. Ancil Boozer  for evaliation of  BP and general medical  condition  F/U surgical evaluation. Follow-up as planned with Dr. Mack Guise  F/U neurological evaluation. May consider pending follow-up evaluations  May consider radiofrequency rhizolysis or intraspinal procedures pending response to present treatment and F/U evaluation   Patient to call [facility] should patient have concerns prior to scheduled return appointment.

## 2015-06-17 NOTE — Progress Notes (Signed)
   Subjective:    Patient ID: Charlotte [name], female    DOB: 06-20-1955, 60 y.o.   MRN: [id number]  HPI The patient is a 60 year old female who returns to pain management for further evaluation and treatment of pain involving the neck shoulders and upper mid and lower back lower extremity regions. The patient is to undergo follow-up evaluation of shoulder and by orthopedic surgeon. The patient is considering surgical intervention of the shoulder. Patient also continues to have lumbar lower extremity pain at the present time is delaying procedures due to diabetes mellitus. We discussed patient's condition. Patient tolerating Neurontin and hydrocodone acetaminophen. Well. Patient continues to have significant lumbar lower extremity pain. We have discussed radiofrequency procedures which can be performed without the use of steroid. We remain available to consider patient for such treatment pending follow-up evaluations. The patient denies any recent trauma change in events of daily living the call significant change in symptoms pathology. The patient will continue present medications as prescribed and will call pain management should there be significant change in condition. Patient will undergo follow-up surgical evaluation of the shoulder as planned as well. All agreed with suggested treatment plan      Review of Systems     Objective:   Physical Exam  There was tenderness of the splenius capitis and occipitalis musculature regions of mild degree with mild tenderness over the trapezius levator scapula and rhomboid musculature regions. There was mild tenderness over the cervical facet cervical paraspinal musculature region. Palpation of the acromial clavicular and glenohumeral joint region was a severe discomfort with limited range of motion of the shoulder to scheduled significant degree. The patient was with decreased grip strength. Tinel and Phalen's maneuver were without increase of pain of  significant degree. Patient was with tenderness to palpation thoracic facet thoracic paraspinal must reason a mild degree with no crepitus of the thoracic region noted. Palpation over the lumbar paraspinal musculatures and lumbar facet region reproducing moderate discomfort. Lateral bending rotation extension and palpation of the lumbar facets reproduce moderate discomfort. There was tenderness over the PSIS and PII S region of moderate degree with mild tenderness of the greater trochanteric region and iliotibial band region. Patient was with some difficulty attempted standing on tiptoes and heels. DTRs were difficult to elicit patient had difficulty relaxing. No definite sensory deficit or dermatomal distribution detected. Negative clonus negative Homans. Abdomen nontender with no costovertebral tenderness noted.      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 Ancil Boozer  for evaliation of  BP and general medical  condition  F/U surgical evaluation. Follow-up as planned with Dr. Mack Guise  F/U neurological evaluation. May consider pending follow-up evaluations  May consider radiofrequency rhizolysis or intraspinal procedures pending response to present treatment and F/U evaluation   Patient to call [facility] should patient have concerns prior to scheduled return appointment.

## 2015-06-23 DIAGNOSIS — Z711 Person with feared health complaint in whom no diagnosis is made: Secondary | ICD-10-CM | POA: Diagnosis not present

## 2015-06-23 DIAGNOSIS — M0609 Rheumatoid arthritis without rheumatoid factor, multiple sites: Secondary | ICD-10-CM | POA: Diagnosis not present

## 2015-06-23 DIAGNOSIS — F329 Major depressive disorder, single episode, unspecified: Secondary | ICD-10-CM | POA: Diagnosis not present

## 2015-07-13 ENCOUNTER — Other Ambulatory Visit: Payer: Self-pay | Admitting: Family Medicine

## 2015-07-13 NOTE — Telephone Encounter (Signed)
Patient requesting refill. 

## 2015-07-15 ENCOUNTER — Ambulatory Visit: Payer: Medicare Other | Attending: Pain Medicine | Admitting: Pain Medicine

## 2015-07-15 ENCOUNTER — Encounter: Payer: Self-pay | Admitting: Pain Medicine

## 2015-07-15 VITALS — BP 135/96 | HR 80 | Temp 98.1°F | Resp 16 | Ht 66.0 in | Wt 215.0 lb

## 2015-07-15 DIAGNOSIS — M792 Neuralgia and neuritis, unspecified: Secondary | ICD-10-CM | POA: Diagnosis not present

## 2015-07-15 DIAGNOSIS — M5030 Other cervical disc degeneration, unspecified cervical region: Secondary | ICD-10-CM

## 2015-07-15 DIAGNOSIS — E1140 Type 2 diabetes mellitus with diabetic neuropathy, unspecified: Secondary | ICD-10-CM | POA: Diagnosis not present

## 2015-07-15 DIAGNOSIS — E1340 Other specified diabetes mellitus with diabetic neuropathy, unspecified: Secondary | ICD-10-CM

## 2015-07-15 DIAGNOSIS — M5416 Radiculopathy, lumbar region: Secondary | ICD-10-CM | POA: Diagnosis not present

## 2015-07-15 DIAGNOSIS — M19019 Primary osteoarthritis, unspecified shoulder: Secondary | ICD-10-CM | POA: Insufficient documentation

## 2015-07-15 DIAGNOSIS — M4806 Spinal stenosis, lumbar region: Secondary | ICD-10-CM | POA: Diagnosis not present

## 2015-07-15 DIAGNOSIS — M159 Polyosteoarthritis, unspecified: Secondary | ICD-10-CM

## 2015-07-15 DIAGNOSIS — M5126 Other intervertebral disc displacement, lumbar region: Secondary | ICD-10-CM | POA: Diagnosis not present

## 2015-07-15 DIAGNOSIS — M25512 Pain in left shoulder: Secondary | ICD-10-CM | POA: Diagnosis present

## 2015-07-15 DIAGNOSIS — M461 Sacroiliitis, not elsewhere classified: Secondary | ICD-10-CM | POA: Diagnosis not present

## 2015-07-15 DIAGNOSIS — M19011 Primary osteoarthritis, right shoulder: Secondary | ICD-10-CM

## 2015-07-15 DIAGNOSIS — M5137 Other intervertebral disc degeneration, lumbosacral region: Secondary | ICD-10-CM

## 2015-07-15 DIAGNOSIS — M19012 Primary osteoarthritis, left shoulder: Secondary | ICD-10-CM

## 2015-07-15 DIAGNOSIS — M25511 Pain in right shoulder: Secondary | ICD-10-CM | POA: Diagnosis present

## 2015-07-15 DIAGNOSIS — M5136 Other intervertebral disc degeneration, lumbar region: Secondary | ICD-10-CM | POA: Insufficient documentation

## 2015-07-15 DIAGNOSIS — M160 Bilateral primary osteoarthritis of hip: Secondary | ICD-10-CM

## 2015-07-15 DIAGNOSIS — M150 Primary generalized (osteo)arthritis: Secondary | ICD-10-CM

## 2015-07-15 MED ORDER — HYDROCODONE-ACETAMINOPHEN 5-325 MG PO TABS: ORAL_TABLET | ORAL | Status: DC

## 2015-07-15 NOTE — Progress Notes (Signed)
   Subjective:    Patient ID: Charlotte [name], female    DOB: 12-22-1955, 60 y.o.   MRN: [id number]  HPI  The patient is a 60 year old female who returns to pain management for further evaluation and treatment of pain involving the shoulders as well as the lower back lower extremity region. The patient is planning to undergo surgery of the shoulder of the left shoulder. The patient will follow-up with Dr. Mack Guise in this regard. The patient continues to be with lower back lower extremity pain is well. We will avoid interventional treatment. The patient will continue the care up Dr. Hortencia Pilar for evaluation and treatment of blood pressure diabetes mellitus and general medical condition. The patient is attempting to have optimization of her general medical condition prior to considering surgical intervention of the left shoulder. The patient has been cautioned regarding activities of daily living. The patient continues tolerate Neurontin and hydrocodone acetaminophen without undesirable side effects which appears to be controlling pain fairly well at this time. We will remain available to consider patient for modification of treatment regimen pending response to treatment and follow-up evaluation. The patient will call pain management should there be significant change in condition. All agreed with suggested treatment plan.  Review of Systems     Objective:   Physical Exam  There was tenderness over the splenius capitis and occipitalis musculature regions of mild degree palpation of the acromioclavicular and glenohumeral joint regions reproduce moderate to moderately severe discomfort. The patient had difficulty attempting to performed the drop test. Tinel and Phalen's maneuver associated with mild to moderate discomfort with grip strength slightly decreased. There appeared to be unremarkable Spurling's maneuver. Palpation over the region of the thoracic facet thoracic paraspinal musculature he was  with moderate tensed palpation of the upper and mid thoracic regions. No crepitus of the thoracic region was noted. Palpation over the lumbar paraspinal misreading lumbar facet region was with moderate tends to palpation with lateral bending rotation extension and palpation of the lumbar facets reproducing moderate discomfort. Straight leg raise was tolerates approximately 30 without increased pain with dorsiflexion noted. DTRs appeared to be trace at the knees. No definite sensory deficit or dermatomal distribution detected. There was negative clonus negative Homans. There was moderate tenderness of the PSIS and PII S region as well as mild tenderness of the greater trochanteric region and iliotibial band region. There was negative clonus negative Homans. No definite sensory deficit dermatomal description of lower extremities noted. 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 Hortencia Pilar  for evaliation of  BP and general medical  condition  F/U surgical evaluation. Follow-up as planned with Dr. Mack Guise  F/U neurological evaluation. May consider pending follow-up evaluations  May consider radiofrequency rhizolysis or intraspinal procedures pending response to present treatment and F/U evaluation   Patient to call [facility] should patient have concerns prior to scheduled return appointment.

## 2015-07-15 NOTE — Progress Notes (Signed)
Safety precautions to be maintained throughout the outpatient stay will include: orient to surroundings, keep bed in low position, maintain call bell within reach at all times, provide assistance with transfer out of bed and ambulation.  

## 2015-07-15 NOTE — Patient Instructions (Addendum)
PLAN   Continue present medication Neurontin and hydrocodone acetaminophen  F/U PCP Dr. Hortencia Pilar  for evaliation of  BP and general medical  condition  F/U surgical evaluation. Follow-up as planned with Dr. Mack Guise  F/U neurological evaluation. May consider pending follow-up evaluations  May consider radiofrequency rhizolysis or intraspinal procedures pending response to present treatment and F/U evaluation   Patient to call [facility] should patient have concerns prior to scheduled return appointment.

## 2015-07-19 ENCOUNTER — Emergency Department: Payer: Medicare Other

## 2015-07-19 ENCOUNTER — Encounter: Payer: Self-pay | Admitting: *Deleted

## 2015-07-19 ENCOUNTER — Emergency Department
Admission: EM | Admit: 2015-07-19 | Discharge: 2015-07-19 | Disposition: A | Discharge status: Home / Self Care | Payer: Medicare Other | Attending: Emergency Medicine | Admitting: Emergency Medicine

## 2015-07-19 DIAGNOSIS — Z794 Long term (current) use of insulin: Secondary | ICD-10-CM | POA: Insufficient documentation

## 2015-07-19 DIAGNOSIS — R911 Solitary pulmonary nodule: Secondary | ICD-10-CM | POA: Insufficient documentation

## 2015-07-19 DIAGNOSIS — R1010 Upper abdominal pain, unspecified: Secondary | ICD-10-CM | POA: Diagnosis not present

## 2015-07-19 DIAGNOSIS — Z79899 Other long term (current) drug therapy: Secondary | ICD-10-CM | POA: Diagnosis not present

## 2015-07-19 DIAGNOSIS — Z7984 Long term (current) use of oral hypoglycemic drugs: Secondary | ICD-10-CM | POA: Diagnosis not present

## 2015-07-19 DIAGNOSIS — R1013 Epigastric pain: Secondary | ICD-10-CM | POA: Diagnosis not present

## 2015-07-19 DIAGNOSIS — Z87891 Personal history of nicotine dependence: Secondary | ICD-10-CM | POA: Diagnosis not present

## 2015-07-19 DIAGNOSIS — R1012 Left upper quadrant pain: Secondary | ICD-10-CM | POA: Diagnosis present

## 2015-07-19 DIAGNOSIS — I10 Essential (primary) hypertension: Secondary | ICD-10-CM | POA: Insufficient documentation

## 2015-07-19 DIAGNOSIS — Z7982 Long term (current) use of aspirin: Secondary | ICD-10-CM | POA: Insufficient documentation

## 2015-07-19 DIAGNOSIS — K859 Acute pancreatitis without necrosis or infection, unspecified: Secondary | ICD-10-CM | POA: Diagnosis not present

## 2015-07-19 DIAGNOSIS — E0821 Diabetes mellitus due to underlying condition with diabetic nephropathy: Secondary | ICD-10-CM | POA: Insufficient documentation

## 2015-07-19 HISTORY — DX: Helicobacter pylori (H. pylori) as the cause of diseases classified elsewhere: B96.81

## 2015-07-19 HISTORY — DX: Peptic ulcer, site unspecified, unspecified as acute or chronic, without hemorrhage or perforation: K27.9

## 2015-07-19 LAB — COMPREHENSIVE METABOLIC PANEL
ALK PHOS: 76 U/L (ref 38–126)
ALT: 14 U/L (ref 14–54)
ANION GAP: 6 (ref 5–15)
AST: 16 U/L (ref 15–41)
Albumin: 3.9 g/dL (ref 3.5–5.0)
BUN: 12 mg/dL (ref 6–20)
CALCIUM: 9.2 mg/dL (ref 8.9–10.3)
CO2: 27 mmol/L (ref 22–32)
CREATININE: 0.89 mg/dL (ref 0.44–1.00)
Chloride: 104 mmol/L (ref 101–111)
Glucose, Bld: 198 mg/dL — ABNORMAL HIGH (ref 65–99)
Potassium: 3.9 mmol/L (ref 3.5–5.1)
Sodium: 137 mmol/L (ref 135–145)
TOTAL PROTEIN: 7.4 g/dL (ref 6.5–8.1)
Total Bilirubin: 0.4 mg/dL (ref 0.3–1.2)

## 2015-07-19 LAB — CBC
HCT: 35.2 % (ref 35.0–47.0)
Hemoglobin: 11.7 g/dL — ABNORMAL LOW (ref 12.0–16.0)
MCH: 26 pg (ref 26.0–34.0)
MCHC: 33.4 g/dL (ref 32.0–36.0)
MCV: 78 fL — ABNORMAL LOW (ref 80.0–100.0)
PLATELETS: 356 10*3/uL (ref 150–440)
RBC: 4.51 MIL/uL (ref 3.80–5.20)
RDW: 15.4 % — ABNORMAL HIGH (ref 11.5–14.5)
WBC: 8.6 10*3/uL (ref 3.6–11.0)

## 2015-07-19 LAB — URINALYSIS COMPLETE WITH MICROSCOPIC (ARMC ONLY)
BILIRUBIN URINE: NEGATIVE
Bacteria, UA: NONE SEEN
Glucose, UA: 50 mg/dL — AB
HGB URINE DIPSTICK: NEGATIVE
KETONES UR: NEGATIVE mg/dL
LEUKOCYTES UA: NEGATIVE
NITRITE: NEGATIVE
PH: 5 (ref 5.0–8.0)
Protein, ur: NEGATIVE mg/dL
SPECIFIC GRAVITY, URINE: 1.024 (ref 1.005–1.030)

## 2015-07-19 LAB — TROPONIN I: Troponin I: <0.03 ng/mL (ref <0.031)

## 2015-07-19 LAB — LIPASE, BLOOD: Lipase: 13 U/L (ref 11–51)

## 2015-07-19 MED ORDER — ONDANSETRON HCL 4 MG/2ML IJ SOLN
4.0000 mg | Freq: Once | INTRAMUSCULAR | Status: AC
  Administered 2015-07-19: 4 mg via INTRAVENOUS
  Filled 2015-07-19: qty 2

## 2015-07-19 MED ORDER — SODIUM CHLORIDE 0.9 % IV BOLUS (SEPSIS)
1000.0000 mL | Freq: Once | INTRAVENOUS | Status: AC
  Administered 2015-07-19: 1000 mL via INTRAVENOUS

## 2015-07-19 MED ORDER — IOHEXOL 350 MG/ML SOLN
100.0000 mL | Freq: Once | INTRAVENOUS | Status: AC | PRN
  Administered 2015-07-19: 100 mL via INTRAVENOUS

## 2015-07-19 MED ORDER — ONDANSETRON 4 MG PO TBDP: 4.0000 mg | ORAL_TABLET | Freq: Once | ORAL | Status: DC | PRN

## 2015-07-19 MED ORDER — PANTOPRAZOLE SODIUM 40 MG IV SOLR
40.0000 mg | Freq: Once | INTRAVENOUS | Status: AC
  Administered 2015-07-19: 40 mg via INTRAVENOUS
  Filled 2015-07-19: qty 40

## 2015-07-19 MED ORDER — MORPHINE SULFATE (PF) 4 MG/ML IV SOLN
4.0000 mg | Freq: Once | INTRAVENOUS | Status: AC
  Administered 2015-07-19: 4 mg via INTRAVENOUS
  Filled 2015-07-19: qty 1

## 2015-07-19 MED ORDER — DICYCLOMINE HCL 20 MG PO TABS: 20.0000 mg | ORAL_TABLET | Freq: Three times a day (TID) | ORAL | Status: DC | PRN

## 2015-07-19 MED ORDER — IOHEXOL 240 MG/ML SOLN
25.0000 mL | Freq: Once | INTRAMUSCULAR | Status: AC | PRN
  Administered 2015-07-19: 25 mL via ORAL

## 2015-07-19 MED ORDER — GI COCKTAIL ~~LOC~~
30.0000 mL | Freq: Once | ORAL | Status: AC
  Administered 2015-07-19: 30 mL via ORAL
  Filled 2015-07-19: qty 30

## 2015-07-19 NOTE — ED Notes (Signed)
Patient transported to CT 

## 2015-07-19 NOTE — ED Notes (Signed)
Discharge instructions reviewed with patient. Patient verbalized understanding. Patient ambulated to lobby without difficulty.   

## 2015-07-19 NOTE — Discharge Instructions (Signed)
Acute Pancreatitis Acute pancreatitis is a disease in which the pancreas becomes suddenly inflamed. The pancreas is a large gland located behind your stomach. The pancreas produces enzymes that help digest food. The pancreas also releases the hormones glucagon and insulin that help regulate blood sugar. Damage to the pancreas occurs when the digestive enzymes from the pancreas are activated and begin attacking the pancreas before being released into the intestine. Most acute attacks last a couple of days and can cause serious complications. Some people become dehydrated and develop low blood pressure. In severe cases, bleeding into the pancreas can lead to shock and can be life-threatening. The lungs, heart, and kidneys may fail. CAUSES  Pancreatitis can happen to anyone. In some cases, the cause is unknown. Most cases are caused by:  Alcohol abuse.  Gallstones. Other less common causes are:  Certain medicines.  Exposure to certain chemicals.  Infection.  Damage caused by an accident (trauma).  Abdominal surgery. SYMPTOMS   Pain in the upper abdomen that may radiate to the back.  Tenderness and swelling of the abdomen.  Nausea and vomiting. DIAGNOSIS  Your caregiver will perform a physical exam. Blood and stool tests may be done to confirm the diagnosis. Imaging tests may also be done, such as X-rays, CT scans, or an ultrasound of the abdomen. TREATMENT  Treatment usually requires a stay in the hospital. Treatment may include:  Pain medicine.  Fluid replacement through an intravenous line (IV).  Placing a tube in the stomach to remove stomach contents and control vomiting.  Not eating for 3 or 4 days. This gives your pancreas a rest, because enzymes are not being produced that can cause further damage.  Antibiotic medicines if your condition is caused by an infection.  Surgery of the pancreas or gallbladder. HOME CARE INSTRUCTIONS   Follow the diet advised by your  caregiver. This may involve avoiding alcohol and decreasing the amount of fat in your diet.  Eat smaller, more frequent meals. This reduces the amount of digestive juices the pancreas produces.  Drink enough fluids to keep your urine clear or pale yellow.  Only take over-the-counter or prescription medicines as directed by your caregiver.  Avoid drinking alcohol if it caused your condition.  Do not smoke.  Get plenty of rest.  Check your blood sugar at home as directed by your caregiver.  Keep all follow-up appointments as directed by your caregiver. SEEK MEDICAL CARE IF:   You do not recover as quickly as expected.  You develop new or worsening symptoms.  You have persistent pain, weakness, or nausea.  You recover and then have another episode of pain. SEEK IMMEDIATE MEDICAL CARE IF:   You are unable to eat or keep fluids down.  Your pain becomes severe.  You have a fever or persistent symptoms for more than 2 to 3 days.  You have a fever and your symptoms suddenly get worse.  Your skin or the white part of your eyes turn yellow (jaundice).  You develop vomiting.  You feel dizzy, or you faint.  Your blood sugar is high (over 300 mg/dL). MAKE SURE YOU:   Understand these instructions.  Will watch your condition.  Will get help right away if you are not doing well or get worse.   This information is not intended to replace advice given to you by your health care provider. Make sure you discuss any questions you have with your health care provider.   Document Released: 05/01/2005 Document Revised: 10/31/2011  Document Reviewed: 08/10/2011 Elsevier Interactive Patient Education 2016 [employer]  Pulmonary Nodule A pulmonary nodule is a small, round growth of tissue in the lung. Pulmonary nodules can range in size from less than 1/5 inch (4 mm) to a little bigger than an inch (25 mm). Most pulmonary nodules are detected when imaging tests of the lung are being  performed for a different problem. Pulmonary nodules are usually not cancerous (benign). However, some pulmonary nodules are cancerous (malignant). Follow-up treatment or testing is based on the size of the pulmonary nodule and your risk of getting lung cancer.  CAUSES Benign pulmonary nodules can be caused by various things. Some of the causes include:   Bacterial, fungal, or viral infections. This is usually an old infection that is no longer active, but it can sometimes be a current, active infection.  A benign mass of tissue.  Inflammation from conditions such as rheumatoid arthritis.   Abnormal blood vessels in the lungs. Malignant pulmonary nodules can result from lung cancer or from cancers that spread to the lung from other places in the body. SIGNS AND SYMPTOMS Pulmonary nodules usually do not cause symptoms. DIAGNOSIS Most often, pulmonary nodules are found incidentally when an X-ray or CT scan is performed to look for some other problem in the lung area. To help determine whether a pulmonary nodule is benign or malignant, your health care provider will take a medical history and order a variety of tests. Tests done may include:   Blood tests.  A skin test called a tuberculin test. This test is used to determine if you have been exposed to the germ that causes tuberculosis.   Chest X-rays. If possible, a new X-ray may be compared with X-rays you have had in the past.   CT scan. This test shows smaller pulmonary nodules more clearly than an X-ray.   Positron emission tomography (PET) scan. In this test, a safe amount of a radioactive substance is injected into the bloodstream. Then, the scan takes a picture of the pulmonary nodule. The radioactive substance is eliminated from your body in your urine.   Biopsy. A tiny piece of the pulmonary nodule is removed so it can be checked under a microscope. TREATMENT  Pulmonary nodules that are benign normally do not require any  treatment because they usually do not cause symptoms or breathing problems. Your health care provider may want to monitor the pulmonary nodule through follow-up CT scans. The frequency of these CT scans will vary based on the size of the nodule and the risk factors for lung cancer. For example, CT scans will need to be done more frequently if the pulmonary nodule is larger and if you have a history of smoking and a family history of cancer. Further testing or biopsies may be done if any follow-up CT scan shows that the size of the pulmonary nodule has increased. HOME CARE INSTRUCTIONS  Only take over-the-counter or prescription medicines as directed by your health care provider.  Keep all follow-up appointments with your health care provider. SEEK MEDICAL CARE IF:  You have trouble breathing when you are active.   You feel sick or unusually tired.   You do not feel like eating.   You lose weight without trying to.   You develop chills or night sweats.  SEEK IMMEDIATE MEDICAL CARE IF:  You cannot catch your breath, or you begin wheezing.   You cannot stop coughing.   You cough up blood.   You become dizzy  or feel like you are going to pass out.   You have sudden chest pain.   You have a fever or persistent symptoms for more than 2-3 days.   You have a fever and your symptoms suddenly get worse. MAKE SURE YOU:  Understand these instructions.  Will watch your condition.  Will get help right away if you are not doing well or get worse.   This information is not intended to replace advice given to you by your health care provider. Make sure you discuss any questions you have with your health care provider.   Document Released: 02/26/2009 Document Revised: 01/01/2013 Document Reviewed: 10/21/2012 Elsevier Interactive Patient Education [employer].

## 2015-07-19 NOTE — ED Notes (Signed)
States upper abd pain since Saturday night, states pain is now radiating to her left side, states nausea

## 2015-07-19 NOTE — ED Notes (Signed)
Assisted pt to the restroom and hooked her back up to the monitor.

## 2015-07-19 NOTE — ED Provider Notes (Signed)
[facility] Emergency Department Provider Note  ____________________________________________  Time seen: Approximately 550 PM  I have reviewed the triage vital signs and the nursing notes.   HISTORY  Chief Complaint Abdominal Pain    HPI Charlotte [name] is a 60 y.o. female with a history of H. pylori and gastritis who is presenting to the emergency department today with upper abdominal left upper quadrant stabbing pain that began this past Friday and has been worsening especially with eating. She says it is radiating up into her chest. She has had nausea but no vomiting. No chest pain or shortness of breath. Says the pain is a 10 out of 10 at this time.   Past Medical History  Diagnosis Date  . Depression   . Diabetes mellitus without complication ([place])   . Hypercholesteremia   . DDD (degenerative disc disease), lumbar   . Fatty liver   . Spinal stenosis   . IBS (irritable bowel syndrome)   . Hemorrhoids   . Diverticulitis   . GERD (gastroesophageal reflux disease)   . Hypertension   . Fibroid tumor   . H pylori ulcer     Patient Active Problem List   Diagnosis Date Noted  . Elevated uric acid in blood 12/02/2014  . B12 deficiency 11/05/2014  . Benign essential HTN 11/05/2014  . Cramps of lower extremity 11/05/2014  . Chronic LBP 11/05/2014  . Chronic bronchitis ([place]) 11/05/2014  . Cystitis 11/05/2014  . Diverticulosis of colon 11/05/2014  . Dyslipidemia 11/05/2014  . Elevated erythrocyte sedimentation rate 11/05/2014  . Gastro-esophageal reflux disease without esophagitis 11/05/2014  . Bleeding internal hemorrhoids 11/05/2014  . Disease of spleen 11/05/2014  . Perennial allergic rhinitis 11/05/2014  . Obesity (BMI 30-39.9) 11/05/2014  . Hemorrhage of rectum and anus 11/05/2014  . Rotator cuff syndrome 11/05/2014  . Leg varices 11/05/2014  . Vitamin D deficiency 11/05/2014  . DJD of shoulder 10/16/2014  . DDD (degenerative disc  disease), lumbosacral 09/18/2014  . DDD (degenerative disc disease), cervical 09/18/2014  . DJD (degenerative joint disease) 09/18/2014  . Neuropathy due to secondary diabetes ([place]) 09/18/2014  . Diabetes mellitus type 2, uncontrolled ([place]) 07/14/2014  . Long term current use of insulin ([place]) 07/14/2014  . Arthritis, degenerative 09/30/2013  . Arthritis or polyarthritis, rheumatoid ([place]) 09/30/2013    Past Surgical History  Procedure Laterality Date  . Abdominal hysterectomy      partial  . Foot surgery    . Cholecystectomy    . Shoulder surgery Right   . Back surgery    . Neck surgery    . Ectopic pregnancy surgery      Current Outpatient Rx  Name  Route  Sig  Dispense  Refill  . allopurinol (ZYLOPRIM) 100 MG tablet   Oral   Take 1 tablet (100 mg total) by mouth daily.   30 tablet   2   . amLODipine (NORVASC) 10 MG tablet      TAKE 1 TABLET(10 MG TOTAL) BY MOUTH DAILY.   90 tablet   1     **Patient requests 90 days supply**   . aspirin 81 MG tablet   Oral   Take 81 mg by mouth daily.         . B-12, METHYLCOBALAMIN, SL   Sublingual   Place 5,000 mcg under the tongue daily.         . BD INSULIN SYRINGE ULTRAFINE 31G X 15/64" 1 ML MISC  Dispense as written.   . ergocalciferol (VITAMIN D2) 50000 UNITS capsule   Oral   Take 1 capsule (50,000 Units total) by mouth once a week.   12 capsule   1   . ergocalciferol (VITAMIN D2) 50000 UNITS capsule   Oral   Take 50,000 Units by mouth once a week. Reported on 05/18/2015         . fluticasone (FLONASE) 50 MCG/ACT nasal spray   Each Nare   Place 2 sprays into both nostrils as needed for allergies.   16 g   2   . gabapentin (NEURONTIN) 300 MG capsule      Limit 1-2 tabs by mouth 2-3 times per day if tolerated   170 capsule   2   . glipiZIDE (GLUCOTROL) 10 MG tablet   Oral   Take 10 mg by mouth 2 (two) times daily before a meal.         . GLUCOSE BLOOD VI               .  HYDROcodone-acetaminophen (NORCO/VICODIN) 5-325 MG tablet      Limit 1 tab by mouth per day or twice a day if tolerated   60 tablet   0   . hydroxychloroquine (PLAQUENIL) 200 MG tablet   Oral   Take 200 mg by mouth daily.         . insulin lispro (HUMALOG) 100 UNIT/ML injection   Subcutaneous   Inject 65 Units into the skin 2 (two) times daily.         . insulin NPH Human (HUMULIN N,NOVOLIN N) 100 UNIT/ML injection   Subcutaneous   Inject 85 Units into the skin 2 (two) times daily before a meal. 9 am and 9 pm         . metFORMIN (GLUCOPHAGE) 500 MG tablet   Oral   Take 500 mg by mouth daily.         . metoprolol succinate (TOPROL-XL) 50 MG 24 hr tablet   Oral   Take 1 tablet (50 mg total) by mouth daily. Take with or immediately following a meal.   30 tablet   3   . omeprazole (PRILOSEC) 20 MG capsule   Oral   Take 20 mg by mouth daily. Reported on 07/15/2015         . pravastatin (PRAVACHOL) 40 MG tablet      TAKE 1 TABLET BY MOUTH EVERY EVENING FOR CHOLESTEROL IN PLACE OF LIPITOR   90 tablet   1   . ranitidine (ZANTAC) 150 MG tablet   Oral   Take 1 tablet (150 mg total) by mouth 2 (two) times daily.   60 tablet   0   . sertraline (ZOLOFT) 50 MG tablet   Oral   Take 50 mg by mouth daily.         . sucralfate (CARAFATE) 1 G tablet   Oral   Take 1 tablet by mouth 4 (four) times daily. Reported on 05/18/2015         . telmisartan-hydrochlorothiazide (MICARDIS HCT) 80-25 MG per tablet   Oral   Take 1 tablet by mouth daily.   30 tablet   8     Allergies Ace inhibitors and Sulfa antibiotics  Family History  Problem Relation Age of Onset  . Diabetes Mother   . Diabetes Father   . Kidney disease Father   . Diabetes Sister   . Kidney disease Brother     Dialysis  Social History Social History  Substance Use Topics  . Smoking status: Former Smoker -- 0.50 packs/day for 32 years    Types: Cigarettes    Start date: 05/15/1972    Quit  date: 09/20/2004  . Smokeless tobacco: Never Used  . Alcohol Use: No    Review of Systems Constitutional: No fever/chills Eyes: No visual changes. ENT: No sore throat. Cardiovascular: Denies chest pain. Respiratory: Denies shortness of breath. Gastrointestinal: no vomiting.  No diarrhea.  No constipation. Genitourinary: Negative for dysuria. Musculoskeletal: Negative for back pain. Skin: Negative for rash. Neurological: Negative for headaches, focal weakness or numbness.  10-point ROS otherwise negative.  ____________________________________________   PHYSICAL EXAM:  VITAL SIGNS: ED Triage Vitals  Enc Vitals Group     BP 07/19/15 1439 164/86 mmHg     Pulse Rate 07/19/15 1439 83     Resp 07/19/15 1439 18     Temp 07/19/15 1439 99.1 F (37.3 C)     Temp Source 07/19/15 1439 Oral     SpO2 07/19/15 1439 99 %     Weight 07/19/15 1439 230 lb (104.327 kg)     Height 07/19/15 1439 5\' 6"  (1.676 m)     Head Cir --      Peak Flow --      Pain Score 07/19/15 1440 10     Pain Loc --      Pain Edu? --      Excl. in [place]? --     Constitutional: Alert and oriented. Well appearing and in no acute distress. Eyes: Conjunctivae are normal. PERRL. EOMI. Head: Atraumatic. Nose: No congestion/rhinnorhea. Mouth/Throat: Mucous membranes are moist.   Neck: No stridor.   Cardiovascular: Normal rate, regular rhythm. Grossly normal heart sounds.  Good peripheral circulation. Respiratory: Normal respiratory effort.  No retractions. Lungs CTAB. Gastrointestinal: Soft tenderness palpation of the epigastrium as well as left upper quadrant. No distention. No abdominal bruits. No CVA tenderness. Musculoskeletal: No lower extremity tenderness nor edema.  No joint effusions. Neurologic:  Normal speech and language. No gross focal neurologic deficits are appreciated.  Skin:  Skin is warm, dry and intact. No rash noted. Psychiatric: Mood and affect are normal. Speech and behavior are  normal.  ____________________________________________   LABS (all labs ordered are listed, but only abnormal results are displayed)  Labs Reviewed  COMPREHENSIVE METABOLIC PANEL - Abnormal; Notable for the following:    Glucose, Bld 198 (*)    All other components within normal limits  CBC - Abnormal; Notable for the following:    Hemoglobin 11.7 (*)    MCV 78.0 (*)    RDW 15.4 (*)    All other components within normal limits  URINALYSIS COMPLETEWITH MICROSCOPIC ([facility] ONLY) - Abnormal; Notable for the following:    Color, Urine YELLOW (*)    APPearance CLEAR (*)    Glucose, UA 50 (*)    Squamous Epithelial / LPF 0-5 (*)    All other components within normal limits  LIPASE, BLOOD  TROPONIN I   ____________________________________________  EKG  ED ECG REPORT I, Doran Stabler, the attending physician, personally viewed and interpreted this ECG.   Date: 07/19/2015  EKG Time: 1445  Rate: 85  Rhythm: normal sinus rhythm  Axis: Normal axis  Intervals:none  ST&T Change: No ST segment elevation or depression. No abnormal T-wave inversion.  ____________________________________________  RADIOLOGY  X-ray with possible 1 cm nodule to the right upper lobe.  IMPRESSION: Mild peripancreatic edema suggesting mild pancreatitis. Note  is made of normal lipase today. Correlate with clinical history.  Fatty infiltration of liver. Stable small liver and splenic lesions unchanged from the prior CT.  Lumbar spondylosis and spinal stenosis.   Electronically Signed By: Franchot Gallo M.D. On: 07/19/2015 19:27       ____________________________________________   PROCEDURES    ____________________________________________   INITIAL IMPRESSION / ASSESSMENT AND PLAN / ED COURSE  Pertinent labs & imaging results that were available during my care of the patient were reviewed by me and considered in my medical decision making (see chart for  details).  ----------------------------------------- 6:19 PM on 07/19/2015 -----------------------------------------  Despite GI cocktail as well as patient admitted compliance with her Zantac at home she is still having abdominal pain. She says that after the GI cocktail she is still a 9 out of 10. We'll CAT scan the abdomen because of concern for possible perforated viscus.  ----------------------------------------- 9:36 PM on 07/19/2015 -----------------------------------------  Patient without any outward signs of distress at this time but still says pain is 8 out of 10. Was able to tolerate by mouth contrast. I discussed with her as well as her visitor that she likely has a mild case of pancreatitis as seen on her CAT scan. She denies drinking. She has had her gallbladder out in the past. It is unclear why she would've pancreatitis at this time. I gave her the option to be admitted to the hospital as she has been requiring IV pain meds without significant relief. However, she says that she would rather try going home and needing a simple diet and returning to the hospital if need be. She says that she already has Vicodin at home which she takes for chronic back pain. I will add Bentyl for pain relief. She understands to very simple diet of clear liquids as well as bland carbohydrates such as toast. She knows to advance her diet slowly she can then feel better. She knows to return for any worsening or concerning symptoms. We also discussed the pulmonary nodule seen on the chest x-ray. She knows that she will need to follow up with her primary care doctor in a week for repeat imaging. She understands that this may be not a nodule at all or could be a sign of early cancer. She understands the need for follow-up imaging. ____________________________________________   FINAL CLINICAL IMPRESSION(S) / ED DIAGNOSES  Pancreatitis. Pulmonary nodule.     Orbie Pyo, MD 07/19/15 2137

## 2015-07-20 ENCOUNTER — Telehealth: Payer: Self-pay | Admitting: Pain Medicine

## 2015-07-20 NOTE — Telephone Encounter (Signed)
Had to go to the ER last night, she has acute pancreatitis and gave her some meds to help Bentyl 20 mg tab  Qty of 15 (Dicyclomine) Can she get this filled ?

## 2015-07-21 NOTE — Telephone Encounter (Signed)
Yes okay to fill Bentyl Patient should follow-up with Dr. Ancil Boozer this week as well

## 2015-07-21 NOTE — Telephone Encounter (Signed)
Patient notified ok to fill Bentyl.

## 2015-07-23 DIAGNOSIS — K760 Fatty (change of) liver, not elsewhere classified: Secondary | ICD-10-CM | POA: Insufficient documentation

## 2015-07-26 ENCOUNTER — Other Ambulatory Visit: Payer: Self-pay | Admitting: Physician Assistant

## 2015-07-26 ENCOUNTER — Ambulatory Visit
Admission: RE | Admit: 2015-07-26 | Discharge: 2015-07-26 | Disposition: A | Discharge status: Home / Self Care | Payer: Medicare Other | Source: Ambulatory Visit | Attending: Physician Assistant | Admitting: Physician Assistant

## 2015-07-26 DIAGNOSIS — R160 Hepatomegaly, not elsewhere classified: Secondary | ICD-10-CM | POA: Insufficient documentation

## 2015-07-26 DIAGNOSIS — K5730 Diverticulosis of large intestine without perforation or abscess without bleeding: Secondary | ICD-10-CM | POA: Insufficient documentation

## 2015-07-26 DIAGNOSIS — R1012 Left upper quadrant pain: Secondary | ICD-10-CM

## 2015-07-26 DIAGNOSIS — R110 Nausea: Secondary | ICD-10-CM | POA: Diagnosis not present

## 2015-07-26 DIAGNOSIS — D7389 Other diseases of spleen: Secondary | ICD-10-CM | POA: Diagnosis not present

## 2015-07-26 DIAGNOSIS — R935 Abnormal findings on diagnostic imaging of other abdominal regions, including retroperitoneum: Secondary | ICD-10-CM | POA: Diagnosis not present

## 2015-07-26 DIAGNOSIS — R109 Unspecified abdominal pain: Secondary | ICD-10-CM | POA: Diagnosis not present

## 2015-07-26 MED ORDER — IOHEXOL 350 MG/ML SOLN
100.0000 mL | Freq: Once | INTRAVENOUS | Status: AC | PRN
  Administered 2015-07-26: 100 mL via INTRAVENOUS

## 2015-07-27 ENCOUNTER — Other Ambulatory Visit: Payer: Self-pay | Admitting: Pain Medicine

## 2015-07-28 DIAGNOSIS — M79643 Pain in unspecified hand: Secondary | ICD-10-CM | POA: Diagnosis not present

## 2015-07-28 DIAGNOSIS — M1990 Unspecified osteoarthritis, unspecified site: Secondary | ICD-10-CM | POA: Diagnosis not present

## 2015-07-28 DIAGNOSIS — K8500 Idiopathic acute pancreatitis without necrosis or infection: Secondary | ICD-10-CM | POA: Diagnosis not present

## 2015-07-28 DIAGNOSIS — Z23 Encounter for immunization: Secondary | ICD-10-CM | POA: Diagnosis not present

## 2015-08-17 ENCOUNTER — Encounter: Payer: Self-pay | Admitting: *Deleted

## 2015-08-17 ENCOUNTER — Ambulatory Visit: Payer: Medicare Other | Attending: Pain Medicine | Admitting: Pain Medicine

## 2015-08-17 ENCOUNTER — Encounter: Payer: Self-pay | Admitting: Pain Medicine

## 2015-08-17 VITALS — BP 154/89 | HR 80 | Temp 98.6°F | Resp 16 | Ht 66.0 in | Wt 236.0 lb

## 2015-08-17 DIAGNOSIS — M5030 Other cervical disc degeneration, unspecified cervical region: Secondary | ICD-10-CM | POA: Diagnosis not present

## 2015-08-17 DIAGNOSIS — E1340 Other specified diabetes mellitus with diabetic neuropathy, unspecified: Secondary | ICD-10-CM

## 2015-08-17 DIAGNOSIS — M19019 Primary osteoarthritis, unspecified shoulder: Secondary | ICD-10-CM | POA: Diagnosis not present

## 2015-08-17 DIAGNOSIS — M792 Neuralgia and neuritis, unspecified: Secondary | ICD-10-CM | POA: Diagnosis not present

## 2015-08-17 DIAGNOSIS — M461 Sacroiliitis, not elsewhere classified: Secondary | ICD-10-CM | POA: Diagnosis not present

## 2015-08-17 DIAGNOSIS — M5126 Other intervertebral disc displacement, lumbar region: Secondary | ICD-10-CM | POA: Insufficient documentation

## 2015-08-17 DIAGNOSIS — E1140 Type 2 diabetes mellitus with diabetic neuropathy, unspecified: Secondary | ICD-10-CM | POA: Insufficient documentation

## 2015-08-17 DIAGNOSIS — M19012 Primary osteoarthritis, left shoulder: Secondary | ICD-10-CM

## 2015-08-17 DIAGNOSIS — M150 Primary generalized (osteo)arthritis: Secondary | ICD-10-CM

## 2015-08-17 DIAGNOSIS — M545 Low back pain: Secondary | ICD-10-CM | POA: Diagnosis present

## 2015-08-17 DIAGNOSIS — M19011 Primary osteoarthritis, right shoulder: Secondary | ICD-10-CM

## 2015-08-17 DIAGNOSIS — M5136 Other intervertebral disc degeneration, lumbar region: Secondary | ICD-10-CM | POA: Insufficient documentation

## 2015-08-17 DIAGNOSIS — M542 Cervicalgia: Secondary | ICD-10-CM | POA: Diagnosis present

## 2015-08-17 DIAGNOSIS — M5416 Radiculopathy, lumbar region: Secondary | ICD-10-CM | POA: Diagnosis not present

## 2015-08-17 DIAGNOSIS — M160 Bilateral primary osteoarthritis of hip: Secondary | ICD-10-CM

## 2015-08-17 DIAGNOSIS — M159 Polyosteoarthritis, unspecified: Secondary | ICD-10-CM

## 2015-08-17 DIAGNOSIS — M5137 Other intervertebral disc degeneration, lumbosacral region: Secondary | ICD-10-CM

## 2015-08-17 MED ORDER — HYDROCODONE-ACETAMINOPHEN 5-325 MG PO TABS: ORAL_TABLET | ORAL | Status: DC

## 2015-08-17 NOTE — Patient Instructions (Addendum)
PLAN   Continue present medication Neurontin and hydrocodone acetaminophen  F/U PCP Dr. Hortencia Pilar  for evaliation of  BP and general medical  condition. Also discussed gout arthritis pancreatitis colonoscopy endoscopy and lesion of liver  F/U Dr.Soles for evaluation of diabetes mellitus as discussed   F/U surgical evaluation. Follow-up as planned with Dr. Mack Guise  Colonoscopy and endoscopy with Dr. Vira Agar as.gretab  Rheumatological follow-up evaluation with Dr. Cindie Laroche can know Junior as discussed  F/U neurological evaluation. May consider PNCV/EMG studies and other studies pending follow-up evaluations  May consider radiofrequency rhizolysis or intraspinal procedures pending response to present treatment and F/U evaluation   Patient to call [facility] Center should patient have concerns prior to scheduled return appointment.

## 2015-08-17 NOTE — Progress Notes (Signed)
Subjective:    Patient ID: Charlotte [name], female    DOB: July 22, 1955, 60 y.o.   MRN: [id number]  HPI   The patient is a 60 year old female who returns to pain management for further evaluation and treatment of pain involving the neck shoulders and upper extremity regions mid lower back and lower extremity region. The patient continues to have pain and limited range of motion of the left shoulder and will follow-up with Dr. Mack Guise in this regard. The patient has been attempting to optimize her diabetes mellitus prior to proceeding with surgery of the shoulder. The patient continues under the care of Dr. Hoy Morn in this regard as well as her endocrinologist. We discussed patient's condition and will avoid interventional treatment. The patient is scheduled to undergo further evaluation by Dr. Vira Agar with endoscopy and colonoscopy having been scheduled. The patient continues medications Neurontin and hydrocodone acetaminophen which we will continue at this time and will consider patient for modification of treatment regimen should they be change in condition. The patient was with understanding and agreed with suggested treatment plan.      Review of Systems          Objective:   Physical Exam   There was tenderness to palpation of the splenius capitis and occipitalis musculature region a mild to moderate degree. Palpation of the acromioclavicular and glenohumeral joint region was with moderate to moderately severe discomfort on the left compared to the right with limited range of motion of the left shoulder. Patient had difficulty performing drop test and was with inability to ABduct the left upper extremity beyond 90 For. Palpation of the thoracic facet thoracic paraspinal musculature region was attends to palpation with moderate muscle spasms noted in the thoracic paraspinal muscular treat. Palpation over the lumbar paraspinal must reason lumbar facet region was attends to palpation of  moderate degree with lateral bending rotation extension and palpation over the lumbar facets reproducing moderate discomfort. Straight leg raise was tolerated to 30 without increased pain with dorsiflexion noted. There was negative clonus negative Homans. DTRs appeared to be trace at the knees. No definite sensory deficit or dermatomal distribution detected. There was tenderness over the PSIS and PII S region a moderate degree with mild tenderness along the greater trochanteric region and iliotibial band region. EHL strength appeared to be decreased. There was negative clonus negative Homans. Abdomen nontender with no costovertebral tenderness noted     Assessment & Plan:      Degenerative disc disease cervical spine Cervical facet syndrome  Degenerative joint disease of shoulder  Degenerative disc disease lumbar spine thecal sac stenosis, L4-5 multifactorial in nature, broad-based disc bulging and disc protrusion, bilateral exiting nerve root compromise  Lumbar facet syndrome  Diabetes mellitus with diabetic neuropathy     PLAN   Continue present medication Neurontin and hydrocodone acetaminophen  F/U PCP Dr. Hortencia Pilar  for evaliation of  BP and general medical  condition. Also discussed gout arthritis pancreatitis colonoscopy endoscopy and lesion of liver  F/U Dr.Soles for evaluation of diabetes mellitus as discussed   F/U surgical evaluation. Follow-up as planned with Dr. Mack Guise  Colonoscopy and endoscopy with Dr. Vira Agar as.gretab  Rheumatological follow-up evaluation with Dr. Cindie Laroche can know Junior as discussed  F/U neurological evaluation. May consider PNCV/EMG studies and other studies pending follow-up evaluations  May consider radiofrequency rhizolysis or intraspinal procedures pending response to present treatment and F/U evaluation   Patient to call Pain Management Center should patient have concerns prior to  scheduled return appointment.

## 2015-08-17 NOTE — Progress Notes (Signed)
Safety precautions to be maintained throughout the outpatient stay will include: orient to surroundings, keep bed in low position, maintain call bell within reach at all times, provide assistance with transfer out of bed and ambulation.  

## 2015-08-18 ENCOUNTER — Ambulatory Visit: Payer: Medicare Other | Admitting: Anesthesiology

## 2015-08-18 ENCOUNTER — Ambulatory Visit
Admission: RE | Admit: 2015-08-18 | Discharge: 2015-08-18 | Disposition: A | Discharge status: Home / Self Care | Payer: Medicare Other | Source: Ambulatory Visit | Attending: Unknown Physician Specialty | Admitting: Unknown Physician Specialty

## 2015-08-18 ENCOUNTER — Encounter
Admission: RE | Disposition: A | Discharge status: Home / Self Care | Payer: Self-pay | Source: Ambulatory Visit | Attending: Unknown Physician Specialty

## 2015-08-18 ENCOUNTER — Encounter: Payer: Self-pay | Admitting: *Deleted

## 2015-08-18 DIAGNOSIS — Z833 Family history of diabetes mellitus: Secondary | ICD-10-CM | POA: Diagnosis not present

## 2015-08-18 DIAGNOSIS — E119 Type 2 diabetes mellitus without complications: Secondary | ICD-10-CM | POA: Insufficient documentation

## 2015-08-18 DIAGNOSIS — Z9889 Other specified postprocedural states: Secondary | ICD-10-CM | POA: Diagnosis not present

## 2015-08-18 DIAGNOSIS — Z882 Allergy status to sulfonamides status: Secondary | ICD-10-CM | POA: Insufficient documentation

## 2015-08-18 DIAGNOSIS — Z8619 Personal history of other infectious and parasitic diseases: Secondary | ICD-10-CM | POA: Diagnosis not present

## 2015-08-18 DIAGNOSIS — K589 Irritable bowel syndrome without diarrhea: Secondary | ICD-10-CM | POA: Insufficient documentation

## 2015-08-18 DIAGNOSIS — F329 Major depressive disorder, single episode, unspecified: Secondary | ICD-10-CM | POA: Insufficient documentation

## 2015-08-18 DIAGNOSIS — Z888 Allergy status to other drugs, medicaments and biological substances status: Secondary | ICD-10-CM | POA: Insufficient documentation

## 2015-08-18 DIAGNOSIS — K5730 Diverticulosis of large intestine without perforation or abscess without bleeding: Secondary | ICD-10-CM | POA: Diagnosis not present

## 2015-08-18 DIAGNOSIS — R1013 Epigastric pain: Secondary | ICD-10-CM | POA: Insufficient documentation

## 2015-08-18 DIAGNOSIS — Z794 Long term (current) use of insulin: Secondary | ICD-10-CM | POA: Insufficient documentation

## 2015-08-18 DIAGNOSIS — Z87891 Personal history of nicotine dependence: Secondary | ICD-10-CM | POA: Diagnosis not present

## 2015-08-18 DIAGNOSIS — K648 Other hemorrhoids: Secondary | ICD-10-CM | POA: Diagnosis not present

## 2015-08-18 DIAGNOSIS — M5136 Other intervertebral disc degeneration, lumbar region: Secondary | ICD-10-CM | POA: Diagnosis not present

## 2015-08-18 DIAGNOSIS — I10 Essential (primary) hypertension: Secondary | ICD-10-CM | POA: Insufficient documentation

## 2015-08-18 DIAGNOSIS — K209 Esophagitis, unspecified: Secondary | ICD-10-CM | POA: Diagnosis not present

## 2015-08-18 DIAGNOSIS — K3189 Other diseases of stomach and duodenum: Secondary | ICD-10-CM | POA: Diagnosis not present

## 2015-08-18 DIAGNOSIS — E7800 Pure hypercholesterolemia, unspecified: Secondary | ICD-10-CM | POA: Insufficient documentation

## 2015-08-18 DIAGNOSIS — K210 Gastro-esophageal reflux disease with esophagitis: Secondary | ICD-10-CM | POA: Diagnosis not present

## 2015-08-18 DIAGNOSIS — Z79899 Other long term (current) drug therapy: Secondary | ICD-10-CM | POA: Diagnosis not present

## 2015-08-18 DIAGNOSIS — K640 First degree hemorrhoids: Secondary | ICD-10-CM | POA: Insufficient documentation

## 2015-08-18 DIAGNOSIS — Z86010 Personal history of colonic polyps: Secondary | ICD-10-CM | POA: Diagnosis not present

## 2015-08-18 DIAGNOSIS — Z90710 Acquired absence of both cervix and uterus: Secondary | ICD-10-CM | POA: Insufficient documentation

## 2015-08-18 DIAGNOSIS — Z7982 Long term (current) use of aspirin: Secondary | ICD-10-CM | POA: Insufficient documentation

## 2015-08-18 DIAGNOSIS — Z841 Family history of disorders of kidney and ureter: Secondary | ICD-10-CM | POA: Diagnosis not present

## 2015-08-18 DIAGNOSIS — K5790 Diverticulosis of intestine, part unspecified, without perforation or abscess without bleeding: Secondary | ICD-10-CM | POA: Diagnosis not present

## 2015-08-18 DIAGNOSIS — Z9049 Acquired absence of other specified parts of digestive tract: Secondary | ICD-10-CM | POA: Diagnosis not present

## 2015-08-18 DIAGNOSIS — Z7951 Long term (current) use of inhaled steroids: Secondary | ICD-10-CM | POA: Insufficient documentation

## 2015-08-18 DIAGNOSIS — K219 Gastro-esophageal reflux disease without esophagitis: Secondary | ICD-10-CM | POA: Insufficient documentation

## 2015-08-18 HISTORY — PX: COLONOSCOPY WITH PROPOFOL: SHX5780

## 2015-08-18 HISTORY — PX: ESOPHAGOGASTRODUODENOSCOPY (EGD) WITH PROPOFOL: SHX5813

## 2015-08-18 LAB — GLUCOSE, CAPILLARY: GLUCOSE-CAPILLARY: 157 mg/dL — AB (ref 65–99)

## 2015-08-18 SURGERY — ESOPHAGOGASTRODUODENOSCOPY (EGD) WITH PROPOFOL
Anesthesia: General

## 2015-08-18 MED ORDER — SODIUM CHLORIDE 0.9 % IV SOLN
INTRAVENOUS | Status: DC
  Administered 2015-08-18: 1000 mL via INTRAVENOUS

## 2015-08-18 MED ORDER — PROPOFOL 10 MG/ML IV BOLUS
INTRAVENOUS | Status: DC | PRN
  Administered 2015-08-18: 70 mg via INTRAVENOUS

## 2015-08-18 MED ORDER — PROPOFOL 500 MG/50ML IV EMUL
INTRAVENOUS | Status: DC | PRN
  Administered 2015-08-18: 150 ug/kg/min via INTRAVENOUS

## 2015-08-18 MED ORDER — SODIUM CHLORIDE 0.9 % IV SOLN: INTRAVENOUS | Status: DC

## 2015-08-18 MED ORDER — LIDOCAINE HCL (PF) 2 % IJ SOLN
INTRAMUSCULAR | Status: DC | PRN
  Administered 2015-08-18: 80 mg via INTRADERMAL

## 2015-08-18 NOTE — Anesthesia Postprocedure Evaluation (Signed)
Anesthesia Post Note  Patient: Charlotte [name]  Procedure(s) Performed: Procedure(s) (LRB): ESOPHAGOGASTRODUODENOSCOPY (EGD) WITH PROPOFOL (N/A) COLONOSCOPY WITH PROPOFOL (N/A)  Patient location during evaluation: PACU Anesthesia Type: General Level of consciousness: awake Pain management: pain level controlled Vital Signs Assessment: post-procedure vital signs reviewed and stable Respiratory status: spontaneous breathing Cardiovascular status: blood pressure returned to baseline Anesthetic complications: no    Last Vitals:  Filed Vitals:   08/18/15 0844 08/18/15 0854  BP: 106/64 111/61  Pulse: 75 74  Temp: 36 C   Resp: 23 12    Last Pain: There were no vitals filed for this visit.               VAN STAVEREN,Jason Hauge

## 2015-08-18 NOTE — Op Note (Signed)
[facility] Gastroenterology Patient Name: Charlotte [name] Procedure Date: 08/18/2015 8:12 AM MRN: [id number] Account #: [id number] Date of Birth: May 02, 1956 Admit Type: Outpatient Age: 60 Room: [facility] ENDO ROOM 4 Gender: Female Note Status: Finalized Procedure:            Colonoscopy Indications:          High risk colon cancer surveillance: Personal history                        of colonic polyps Providers:            Manya Silvas, MD Referring MD:         Kerin Perna, MD (Referring MD) Medicines:            Propofol per Anesthesia Complications:        No immediate complications. Procedure:            Pre-Anesthesia Assessment:                       - After reviewing the risks and benefits, the patient                        was deemed in satisfactory condition to undergo the                        procedure.                       After obtaining informed consent, the colonoscope was                        passed under direct vision. Throughout the procedure,                        the patient's blood pressure, pulse, and oxygen                        saturations were monitored continuously. The                        Colonoscope was introduced through the anus and                        advanced to the the cecum, identified by appendiceal                        orifice and ileocecal valve. The colonoscopy was                        performed without difficulty. The patient tolerated the                        procedure well. The quality of the bowel preparation                        was excellent. Findings:      Many small-mouthed diverticula were found in the sigmoid colon.      Internal hemorrhoids were found during endoscopy. The hemorrhoids were       small and Grade I (internal hemorrhoids that do not prolapse).      The exam was otherwise  without abnormality. Impression:           - Diverticulosis in the sigmoid colon.                       -  Internal hemorrhoids.                       - The examination was otherwise normal.                       - No specimens collected. Recommendation:       - Repeat colonoscopy in 5 years for surveillance. Manya Silvas, MD 08/18/2015 8:41:52 AM This report has been signed electronically. Number of Addenda: 0 Note Initiated On: 08/18/2015 8:12 AM Scope Withdrawal Time: 0 hours 8 minutes 32 seconds  Total Procedure Duration: 0 hours 13 minutes 53 seconds       [facility]

## 2015-08-18 NOTE — H&P (Signed)
Primary Care Physician:  Hortencia Pilar, MD Primary Gastroenterologist:  Dr. Vira Agar  Pre-Procedure History & Physical: HPI:  Charlotte [name] is a 60 y.o. female is here for an endoscopy and colonoscopy.   Past Medical History  Diagnosis Date  . Depression   . Diabetes mellitus without complication ([place])   . Hypercholesteremia   . DDD (degenerative disc disease), lumbar   . Fatty liver   . Spinal stenosis   . IBS (irritable bowel syndrome)   . Hemorrhoids   . Diverticulitis   . GERD (gastroesophageal reflux disease)   . Hypertension   . Fibroid tumor   . H pylori ulcer     Past Surgical History  Procedure Laterality Date  . Abdominal hysterectomy      partial  . Foot surgery    . Cholecystectomy    . Shoulder surgery Right   . Back surgery    . Neck surgery    . Ectopic pregnancy surgery      Prior to Admission medications   Medication Sig Start Date End Date Taking? Authorizing Provider  amLODipine (NORVASC) 10 MG tablet TAKE 1 TABLET(10 MG TOTAL) BY MOUTH DAILY. 07/13/15  Yes Steele Sizer, MD  allopurinol (ZYLOPRIM) 100 MG tablet Take 1 tablet (100 mg total) by mouth daily. 12/02/14   Steele Sizer, MD  aspirin 81 MG tablet Take 81 mg by mouth daily.    Historical Provider, MD  B-12, METHYLCOBALAMIN, SL Place 5,000 mcg under the tongue daily.    Historical Provider, MD  BD INSULIN SYRINGE ULTRAFINE 31G X 15/64" 1 ML MISC  09/24/14   Historical Provider, MD  dicyclomine (BENTYL) 20 MG tablet Take 1 tablet (20 mg total) by mouth 3 (three) times daily as needed. 07/19/15   Orbie Pyo, MD  ergocalciferol (VITAMIN D2) 50000 UNITS capsule Take 1 capsule (50,000 Units total) by mouth once a week. 12/02/14   Steele Sizer, MD  ergocalciferol (VITAMIN D2) 50000 UNITS capsule Take 50,000 Units by mouth once a week. Reported on 05/18/2015    Historical Provider, MD  fluticasone (FLONASE) 50 MCG/ACT nasal spray Place 2 sprays into both nostrils as needed for allergies.  11/06/14   Steele Sizer, MD  gabapentin (NEURONTIN) 300 MG capsule Limit 1-2 tabs by mouth 2-3 times per day if tolerated 06/17/15   Mohammed Kindle, MD  glipiZIDE (GLUCOTROL) 10 MG tablet Take 10 mg by mouth 2 (two) times daily before a meal.    Historical Provider, MD  GLUCOSE BLOOD VI  04/21/11   Historical Provider, MD  HYDROcodone-acetaminophen (NORCO/VICODIN) 5-325 MG tablet Limit 1 tab by mouth per day or twice a day if tolerated 08/17/15   Mohammed Kindle, MD  hydroxychloroquine (PLAQUENIL) 200 MG tablet Take 200 mg by mouth daily.    Historical Provider, MD  insulin lispro (HUMALOG) 100 UNIT/ML injection Inject 65 Units into the skin 2 (two) times daily.    Historical Provider, MD  insulin NPH Human (HUMULIN N,NOVOLIN N) 100 UNIT/ML injection Inject 85 Units into the skin 2 (two) times daily before a meal. 9 am and 9 pm    Historical Provider, MD  metFORMIN (GLUCOPHAGE) 500 MG tablet Take 500 mg by mouth daily.    Historical Provider, MD  metoprolol succinate (TOPROL-XL) 50 MG 24 hr tablet Take 1 tablet (50 mg total) by mouth daily. Take with or immediately following a meal. 11/06/14   Steele Sizer, MD  omeprazole (PRILOSEC) 20 MG capsule Take 20 mg by mouth daily. Reported  on 07/15/2015    Historical Provider, MD  pravastatin (PRAVACHOL) 40 MG tablet TAKE 1 TABLET BY MOUTH EVERY EVENING FOR CHOLESTEROL IN PLACE OF LIPITOR 02/05/15   Steele Sizer, MD  ranitidine (ZANTAC) 150 MG tablet Take 1 tablet (150 mg total) by mouth 2 (two) times daily. 11/20/14   Steele Sizer, MD  sertraline (ZOLOFT) 50 MG tablet Take 50 mg by mouth daily.    Historical Provider, MD  sucralfate (CARAFATE) 1 G tablet Take 1 tablet by mouth 4 (four) times daily. Reported on 05/18/2015 11/26/14   Historical Provider, MD  telmisartan-hydrochlorothiazide (MICARDIS HCT) 80-25 MG per tablet Take 1 tablet by mouth daily. 11/06/14   Steele Sizer, MD    Allergies as of 08/14/2015 - Review Complete 07/26/2015  Allergen Reaction Noted   . Ace inhibitors  09/08/2014  . Sulfa antibiotics Rash 09/08/2014    Family History  Problem Relation Age of Onset  . Diabetes Mother   . Diabetes Father   . Kidney disease Father   . Diabetes Sister   . Kidney disease Brother     Dialysis    Social History   Social History  . Marital Status: Married    Spouse Name: N/A  . Number of Children: N/A  . Years of Education: N/A   Occupational History  . Not on file.   Social History Main Topics  . Smoking status: Former Smoker -- 0.50 packs/day for 32 years    Types: Cigarettes    Start date: 05/15/1972    Quit date: 09/20/2004  . Smokeless tobacco: Never Used  . Alcohol Use: No  . Drug Use: No  . Sexual Activity:    Partners: Male   Other Topics Concern  . Not on file   Social History Narrative    Review of Systems: See HPI, otherwise negative ROS  Physical Exam: Pulse 80  Temp(Src) 96.7 F (35.9 C) (Tympanic)  Resp 20  SpO2 100% General:   Alert,  pleasant and cooperative in NAD Head:  Normocephalic and atraumatic. Neck:  Supple; no masses or thyromegaly. Lungs:  Clear throughout to auscultation.    Heart:  Regular rate and rhythm. Abdomen:  Soft, nontender and nondistended. Normal bowel sounds, without guarding, and without rebound.   Neurologic:  Alert and  oriented x4;  grossly normal neurologically.  Impression/Plan: Charlotte [name] is here for an endoscopy and colonoscopy to be performed for [facility] colon polyps and GERD, epigastric abdominal pain.  Risks, benefits, limitations, and alternatives regarding  endoscopy and colonoscopy have been reviewed with the patient.  Questions have been answered.  All parties agreeable.   Gaylyn Cheers, MD  08/18/2015, 8:07 AM

## 2015-08-18 NOTE — Transfer of Care (Signed)
Immediate Anesthesia Transfer of Care Note  Patient: Charlotte [name]  Procedure(s) Performed: Procedure(s): ESOPHAGOGASTRODUODENOSCOPY (EGD) WITH PROPOFOL (N/A) COLONOSCOPY WITH PROPOFOL (N/A)  Patient Location: PACU  Anesthesia Type:General  Level of Consciousness: responds to stimulation, sleeping  Airway & Oxygen Therapy: Patient Spontanous Breathing and Patient connected to nasal cannula oxygen  Post-op Assessment: Report given to RN and Post -op Vital signs reviewed and stable  Post vital signs: Reviewed and stable  Last Vitals:  Filed Vitals:   08/18/15 0726 08/18/15 0844  BP:  106/64  Pulse: 80 75  Temp: 35.9 C   Resp: 20 23    Complications: No apparent anesthesia complications

## 2015-08-18 NOTE — H&P (Signed)
Primary Care Physician:  Hortencia Pilar, MD Primary Gastroenterologist:  Dr. Vira Agar  Pre-Procedure History & Physical: HPI:  Charlotte [name] is a 60 y.o. female is here for an endoscopy and colonoscopy.   Past Medical History  Diagnosis Date  . Depression   . Diabetes mellitus without complication ([place])   . Hypercholesteremia   . DDD (degenerative disc disease), lumbar   . Fatty liver   . Spinal stenosis   . IBS (irritable bowel syndrome)   . Hemorrhoids   . Diverticulitis   . GERD (gastroesophageal reflux disease)   . Hypertension   . Fibroid tumor   . H pylori ulcer     Past Surgical History  Procedure Laterality Date  . Abdominal hysterectomy      partial  . Foot surgery    . Cholecystectomy    . Shoulder surgery Right   . Back surgery    . Neck surgery    . Ectopic pregnancy surgery      Prior to Admission medications   Medication Sig Start Date End Date Taking? Authorizing Provider  amLODipine (NORVASC) 10 MG tablet TAKE 1 TABLET(10 MG TOTAL) BY MOUTH DAILY. 07/13/15  Yes Steele Sizer, MD  allopurinol (ZYLOPRIM) 100 MG tablet Take 1 tablet (100 mg total) by mouth daily. 12/02/14   Steele Sizer, MD  aspirin 81 MG tablet Take 81 mg by mouth daily.    Historical Provider, MD  B-12, METHYLCOBALAMIN, SL Place 5,000 mcg under the tongue daily.    Historical Provider, MD  BD INSULIN SYRINGE ULTRAFINE 31G X 15/64" 1 ML MISC  09/24/14   Historical Provider, MD  dicyclomine (BENTYL) 20 MG tablet Take 1 tablet (20 mg total) by mouth 3 (three) times daily as needed. 07/19/15   Orbie Pyo, MD  ergocalciferol (VITAMIN D2) 50000 UNITS capsule Take 1 capsule (50,000 Units total) by mouth once a week. 12/02/14   Steele Sizer, MD  ergocalciferol (VITAMIN D2) 50000 UNITS capsule Take 50,000 Units by mouth once a week. Reported on 05/18/2015    Historical Provider, MD  fluticasone (FLONASE) 50 MCG/ACT nasal spray Place 2 sprays into both nostrils as needed for allergies.  11/06/14   Steele Sizer, MD  gabapentin (NEURONTIN) 300 MG capsule Limit 1-2 tabs by mouth 2-3 times per day if tolerated 06/17/15   Mohammed Kindle, MD  glipiZIDE (GLUCOTROL) 10 MG tablet Take 10 mg by mouth 2 (two) times daily before a meal.    Historical Provider, MD  GLUCOSE BLOOD VI  04/21/11   Historical Provider, MD  HYDROcodone-acetaminophen (NORCO/VICODIN) 5-325 MG tablet Limit 1 tab by mouth per day or twice a day if tolerated 08/17/15   Mohammed Kindle, MD  hydroxychloroquine (PLAQUENIL) 200 MG tablet Take 200 mg by mouth daily.    Historical Provider, MD  insulin lispro (HUMALOG) 100 UNIT/ML injection Inject 65 Units into the skin 2 (two) times daily.    Historical Provider, MD  insulin NPH Human (HUMULIN N,NOVOLIN N) 100 UNIT/ML injection Inject 85 Units into the skin 2 (two) times daily before a meal. 9 am and 9 pm    Historical Provider, MD  metFORMIN (GLUCOPHAGE) 500 MG tablet Take 500 mg by mouth daily.    Historical Provider, MD  metoprolol succinate (TOPROL-XL) 50 MG 24 hr tablet Take 1 tablet (50 mg total) by mouth daily. Take with or immediately following a meal. 11/06/14   Steele Sizer, MD  omeprazole (PRILOSEC) 20 MG capsule Take 20 mg by mouth daily. Reported  on 07/15/2015    Historical Provider, MD  pravastatin (PRAVACHOL) 40 MG tablet TAKE 1 TABLET BY MOUTH EVERY EVENING FOR CHOLESTEROL IN PLACE OF LIPITOR 02/05/15   Steele Sizer, MD  ranitidine (ZANTAC) 150 MG tablet Take 1 tablet (150 mg total) by mouth 2 (two) times daily. 11/20/14   Steele Sizer, MD  sertraline (ZOLOFT) 50 MG tablet Take 50 mg by mouth daily.    Historical Provider, MD  sucralfate (CARAFATE) 1 G tablet Take 1 tablet by mouth 4 (four) times daily. Reported on 05/18/2015 11/26/14   Historical Provider, MD  telmisartan-hydrochlorothiazide (MICARDIS HCT) 80-25 MG per tablet Take 1 tablet by mouth daily. 11/06/14   Steele Sizer, MD    Allergies as of 08/14/2015 - Review Complete 07/26/2015  Allergen Reaction Noted   . Ace inhibitors  09/08/2014  . Sulfa antibiotics Rash 09/08/2014    Family History  Problem Relation Age of Onset  . Diabetes Mother   . Diabetes Father   . Kidney disease Father   . Diabetes Sister   . Kidney disease Brother     Dialysis    Social History   Social History  . Marital Status: Married    Spouse Name: N/A  . Number of Children: N/A  . Years of Education: N/A   Occupational History  . Not on file.   Social History Main Topics  . Smoking status: Former Smoker -- 0.50 packs/day for 32 years    Types: Cigarettes    Start date: 05/15/1972    Quit date: 09/20/2004  . Smokeless tobacco: Never Used  . Alcohol Use: No  . Drug Use: No  . Sexual Activity:    Partners: Male   Other Topics Concern  . Not on file   Social History Narrative    Review of Systems: See HPI, otherwise negative ROS  Physical Exam: BP 106/64 mmHg  Pulse 75  Temp(Src) 96.8 F (36 C) (Tympanic)  Resp 23  SpO2 100% General:   Alert,  pleasant and cooperative in NAD Head:  Normocephalic and atraumatic. Neck:  Supple; no masses or thyromegaly. Lungs:  Clear throughout to auscultation.    Heart:  Regular rate and rhythm. Abdomen:  Soft, nontender and nondistended. Normal bowel sounds, without guarding, and without rebound.   Neurologic:  Alert and  oriented x4;  grossly normal neurologically.  Impression/Plan: [name] is here for an endoscopy and colonoscopy to be performed for [facility] colon polyps and GERD, abd pain.  Risks, benefits, limitations, and alternatives regarding  endoscopy and colonoscopy have been reviewed with the patient.  Questions have been answered.  All parties agreeable.   Gaylyn Cheers, MD  08/18/2015, 8:50 AM

## 2015-08-18 NOTE — Anesthesia Preprocedure Evaluation (Signed)
Anesthesia Evaluation  Patient identified by MRN, date of birth, ID band Patient awake    Reviewed: Allergy & Precautions, NPO status , Patient's Chart, lab work & pertinent test results  Airway Mallampati: III       Dental  (+) Upper Dentures   Pulmonary sleep apnea , COPD, former smoker,     + decreased breath sounds      Cardiovascular Exercise Tolerance: Good hypertension, Pt. on home beta blockers + Peripheral Vascular Disease   Rhythm:Regular     Neuro/Psych    GI/Hepatic Neg liver ROS, GERD  ,  Endo/Other  diabetes, Well Controlled, Type 1, Insulin DependentMorbid obesity  Renal/GU      Musculoskeletal   Abdominal (+) + obese,   Peds  Hematology   Anesthesia Other Findings   Reproductive/Obstetrics                             Anesthesia Physical Anesthesia Plan  ASA: III  Anesthesia Plan: General   Post-op Pain Management:    Induction: Intravenous  Airway Management Planned: Natural Airway and Nasal Cannula  Additional Equipment:   Intra-op Plan:   Post-operative Plan:   Informed Consent: I have reviewed the patients History and Physical, chart, labs and discussed the procedure including the risks, benefits and alternatives for the proposed anesthesia with the patient or authorized representative who has indicated his/her understanding and acceptance.     Plan Discussed with: CRNA  Anesthesia Plan Comments:         Anesthesia Quick Evaluation

## 2015-08-18 NOTE — Op Note (Signed)
[facility] Patient Name: Charlotte [name] Procedure Date: 08/18/2015 8:12 AM MRN: [id number] Account #: [id number] Date of Birth: 10-Nov-1955 Admit Type: Outpatient Age: 60 Room: [facility] ENDO ROOM 4 Gender: Female Note Status: Finalized Procedure:            Upper GI endoscopy Indications:          Heartburn Providers:            Manya Silvas, MD Referring MD:         Kerin Perna, MD (Referring MD) Medicines:            Propofol per Anesthesia Complications:        No immediate complications. Procedure:            Pre-Anesthesia Assessment:                       - After reviewing the risks and benefits, the patient                        was deemed in satisfactory condition to undergo the                        procedure.                       After obtaining informed consent, the endoscope was                        passed under direct vision. Throughout the procedure,                        the patient's blood pressure, pulse, and oxygen                        saturations were monitored continuously. The Endoscope                        was introduced through the mouth, and advanced to the                        second part of duodenum. The upper GI endoscopy was                        accomplished without difficulty. The patient tolerated                        the procedure well. Findings:      LA Grade A (one or more mucosal breaks less than 5 mm, not extending       between tops of 2 mucosal folds) esophagitis with no bleeding was found       39 cm from the incisors. Biopsies were taken with a cold forceps for       histology.      The stomach was normal.      The examined duodenum was normal. Impression:           - LA Grade A reflux esophagitis. Rule out Barrett's                        esophagus. Biopsied.                       -  Normal stomach.                       - Normal examined duodenum. Recommendation:       - Await  pathology results.                       - Perform a colonoscopy as previously scheduled. Manya Silvas, MD 08/18/2015 8:23:55 AM This report has been signed electronically. Number of Addenda: 0 Note Initiated On: 08/18/2015 8:12 AM      [facility]

## 2015-08-19 ENCOUNTER — Other Ambulatory Visit: Payer: Self-pay | Admitting: Family Medicine

## 2015-08-19 ENCOUNTER — Ambulatory Visit
Admission: RE | Admit: 2015-08-19 | Discharge: 2015-08-19 | Disposition: A | Discharge status: Home / Self Care | Payer: Medicare Other | Source: Ambulatory Visit | Attending: Family Medicine | Admitting: Family Medicine

## 2015-08-19 DIAGNOSIS — M19041 Primary osteoarthritis, right hand: Secondary | ICD-10-CM | POA: Insufficient documentation

## 2015-08-19 DIAGNOSIS — M19042 Primary osteoarthritis, left hand: Secondary | ICD-10-CM | POA: Diagnosis not present

## 2015-08-19 DIAGNOSIS — M79641 Pain in right hand: Secondary | ICD-10-CM

## 2015-08-19 DIAGNOSIS — M79642 Pain in left hand: Secondary | ICD-10-CM | POA: Diagnosis not present

## 2015-08-19 LAB — SURGICAL PATHOLOGY

## 2015-09-01 DIAGNOSIS — Z794 Long term (current) use of insulin: Secondary | ICD-10-CM | POA: Diagnosis not present

## 2015-09-01 DIAGNOSIS — E1142 Type 2 diabetes mellitus with diabetic polyneuropathy: Secondary | ICD-10-CM | POA: Diagnosis not present

## 2015-09-01 DIAGNOSIS — I10 Essential (primary) hypertension: Secondary | ICD-10-CM | POA: Diagnosis not present

## 2015-09-01 DIAGNOSIS — E1165 Type 2 diabetes mellitus with hyperglycemia: Secondary | ICD-10-CM | POA: Diagnosis not present

## 2015-09-02 DIAGNOSIS — M0579 Rheumatoid arthritis with rheumatoid factor of multiple sites without organ or systems involvement: Secondary | ICD-10-CM | POA: Diagnosis not present

## 2015-09-02 DIAGNOSIS — M79642 Pain in left hand: Secondary | ICD-10-CM | POA: Diagnosis not present

## 2015-09-02 DIAGNOSIS — M65331 Trigger finger, right middle finger: Secondary | ICD-10-CM | POA: Diagnosis not present

## 2015-09-02 DIAGNOSIS — M79641 Pain in right hand: Secondary | ICD-10-CM | POA: Diagnosis not present

## 2015-09-13 ENCOUNTER — Other Ambulatory Visit: Payer: Self-pay | Admitting: Physician Assistant

## 2015-09-13 DIAGNOSIS — K219 Gastro-esophageal reflux disease without esophagitis: Secondary | ICD-10-CM | POA: Diagnosis not present

## 2015-09-13 DIAGNOSIS — Z8719 Personal history of other diseases of the digestive system: Secondary | ICD-10-CM | POA: Diagnosis not present

## 2015-09-13 DIAGNOSIS — R1084 Generalized abdominal pain: Secondary | ICD-10-CM

## 2015-09-13 DIAGNOSIS — R110 Nausea: Secondary | ICD-10-CM

## 2015-09-13 DIAGNOSIS — K760 Fatty (change of) liver, not elsewhere classified: Secondary | ICD-10-CM

## 2015-09-13 DIAGNOSIS — M549 Dorsalgia, unspecified: Secondary | ICD-10-CM | POA: Diagnosis not present

## 2015-09-13 DIAGNOSIS — R1011 Right upper quadrant pain: Secondary | ICD-10-CM

## 2015-09-13 DIAGNOSIS — R935 Abnormal findings on diagnostic imaging of other abdominal regions, including retroperitoneum: Secondary | ICD-10-CM

## 2015-09-14 ENCOUNTER — Ambulatory Visit: Payer: Medicare Other | Attending: Rheumatology | Admitting: Occupational Therapy

## 2015-09-14 DIAGNOSIS — M25642 Stiffness of left hand, not elsewhere classified: Secondary | ICD-10-CM

## 2015-09-14 DIAGNOSIS — M79641 Pain in right hand: Secondary | ICD-10-CM

## 2015-09-14 DIAGNOSIS — M6281 Muscle weakness (generalized): Secondary | ICD-10-CM | POA: Diagnosis not present

## 2015-09-14 DIAGNOSIS — M25641 Stiffness of right hand, not elsewhere classified: Secondary | ICD-10-CM | POA: Diagnosis not present

## 2015-09-14 DIAGNOSIS — M79642 Pain in left hand: Secondary | ICD-10-CM | POA: Diagnosis not present

## 2015-09-14 NOTE — Patient Instructions (Signed)
Contrast  AROM for tendon glides - but L hand only to 2 cm foam block -  Stop when feeling pull Compression sleeve for L 2nd digit to decrease edema and pain   Pain keep under 2/10  Joint protection principles , AE trng - hand out provided

## 2015-09-14 NOTE — Therapy (Signed)
[facility] [address], [place], [address] Phone: [phone]   Fax:  [phone]  Occupational Therapy Treatment  Patient Details  Name: Charlotte [name] MRN: [id number] Date of Birth: [birth year] Referring Provider: Jefm Bryant   Encounter Date: 09/14/2015      OT End of Session - 09/14/15 [id number]    Visit Number 1   Number of Visits 8   Date for OT Re-Evaluation 10/12/15   OT Start Time 0810   OT Stop Time 0908   OT Time Calculation (min) 58 min   Activity Tolerance Patient tolerated treatment well   Behavior During Therapy [facility] for tasks assessed/performed      Past Medical History  Diagnosis Date  . Depression   . Diabetes mellitus without complication ([place])   . Hypercholesteremia   . DDD (degenerative disc disease), lumbar   . Fatty liver   . Spinal stenosis   . IBS (irritable bowel syndrome)   . Hemorrhoids   . Diverticulitis   . GERD (gastroesophageal reflux disease)   . Hypertension   . Fibroid tumor   . H pylori ulcer     Past Surgical History  Procedure Laterality Date  . Abdominal hysterectomy      partial  . Foot surgery    . Cholecystectomy    . Shoulder surgery Right   . Back surgery    . Neck surgery    . Ectopic pregnancy surgery    . Esophagogastroduodenoscopy (egd) with propofol N/A 08/18/2015    Procedure: ESOPHAGOGASTRODUODENOSCOPY (EGD) WITH PROPOFOL;  Surgeon: Manya Silvas, MD;  Location: [facility];  Service: Endoscopy;  Laterality: N/A;  . Colonoscopy with propofol N/A 08/18/2015    Procedure: COLONOSCOPY WITH PROPOFOL;  Surgeon: Manya Silvas, MD;  Location: [facility] ENDOSCOPY;  Service: Endoscopy;  Laterality: N/A;    There were no vitals filed for this visit.      Subjective Assessment - 09/14/15 0823    Subjective  Pain usually worse in R  but today in L - numbness at times - and then my ring finger on R triggering - and having hard time to use my hands -     Patient Stated Goals Pain better and to see what there are that helps instead of meds - adn use my hands better    Currently in Pain? Yes   Pain Score 8    Pain Location Hand   Pain Orientation Right;Left   Pain Descriptors / Indicators Aching   Pain Onset More than a month ago            [facility] OT Assessment - 09/14/15 0001    Assessment   Diagnosis Bilateral hand pain    Referring Provider [facility]    Onset Date 05/16/15   Precautions   Required Braces or Orthoses --  Prefab thumb spica on L   Home  Environment   Lives With Spouse   Prior Function   Vocation On disability   Leisure R hand dominant , do house work, Oceanographer, church , reading some but not much ,     Strength   Right Hand Grip (lbs) 20   Right Hand Lateral Pinch 9 lbs   Right Hand 3 Point Pinch 7 lbs   Left Hand Grip (lbs) 9   Left Hand Lateral Pinch 4 lbs  lat on 3rd digit -2nd pain full   Left Hand 3 Point Pinch 5 lbs  used only  3rd    Right Hand AROM   R Index  MCP 0-90 65 Degrees   R Index PIP 0-100 100 Degrees   R Long  MCP 0-90 80 Degrees   R Long PIP 0-100 100 Degrees   R Ring  MCP 0-90 80 Degrees   R Ring PIP 0-100 100 Degrees   R Little  MCP 0-90 90 Degrees   R Little PIP 0-100 90 Degrees   Left Hand AROM   L Index  MCP 0-90 60 Degrees   L Index PIP 0-100 40 Degrees   L Long  MCP 0-90 60 Degrees   L Long PIP 0-100 75 Degrees   L Ring  MCP 0-90 50 Degrees   L Ring PIP 0-100 75 Degrees   L Little  MCP 0-90 45 Degrees   L Little PIP 0-100 70 Degrees      Contrast done  Pt had decrease pain and increase ROM - pt ed on HEP and joint protection principles  Tendon glides and tapping of digits  Hand out  provided                    OT Education - 09/14/15 1240    Education provided Yes   Education Details HEP and joint protection /modifications ed on    [employer]) Educated Patient   Methods Explanation;Demonstration;Tactile cues;Handout;Verbal cues   Comprehension Verbal  cues required;Returned demonstration;Verbalized understanding          OT Short Term Goals - 09/14/15 1245    OT SHORT TERM GOAL #1   Title Pain on PRWHE improve with 15 points    Baseline at eval 44/50 for pain score on PRWHE   Time 3   Period Weeks   Status New   OT SHORT TERM GOAL #2   Title AROM in digits improve for pt to touch palm wtih pain less than 3/10    Baseline pain at rest 8/10 and MC's and PIP's impaired - see flowsheet   Time 4   Period Weeks   Status New           OT Long Term Goals - 09/14/15 1254    OT LONG TERM GOAL #1   Title Pt able to verbalize 3 joint protection and AE to increase ease in ADL's and function on PRWHE by 15 points    Baseline very little knowledge - 46/50 for function on PRWHE    Time 4   Period Weeks   Status New               Plan - 09/14/15 1242    Clinical Impression Statement Pt present at eval with increase pain and swelling in bilateral hands - L hand worse than R this date - some triggering report at R 4th - pain increase and edema in L 2nd digit - pt show decrease ROM , grip /prehension strength - limitiing her functional use of hands in picking up and grasping objects,  tie shoes, open bottle, squeeze wascloth,   Rehab Potential Fair   OT Frequency 2x / week   OT Duration 4 weeks   OT Treatment/Interventions Self-care/ADL training;Ultrasound;Contrast Bath;Fluidtherapy;Parrafin;DME and/or AE instruction;Splinting;Patient/family education;Therapeutic exercise;Manual Therapy;Passive range of motion   Plan assess progress with HEP , update and modify as needed - ed on modifications   OT Home Exercise Plan see pt instruction    Consulted and Agree with Plan of Care Patient      Patient will benefit from  skilled therapeutic intervention in order to improve the following deficits and impairments:  Impaired flexibility, Decreased range of motion, Decreased coordination, Decreased strength, Impaired UE functional use, Pain,  Decreased knowledge of use of DME, Increased edema  Visit Diagnosis: Pain in left hand - Plan: Ot plan of care cert/re-cert  Pain in right hand - Plan: Ot plan of care cert/re-cert  Stiffness of left hand, not elsewhere classified - Plan: Ot plan of care cert/re-cert  Stiffness of right hand, not elsewhere classified - Plan: Ot plan of care cert/re-cert  Muscle weakness (generalized) - Plan: Ot plan of care cert/re-cert    Problem List Patient Active Problem List   Diagnosis Date Noted  . Elevated uric acid in blood 12/02/2014  . B12 deficiency 11/05/2014  . Benign essential HTN 11/05/2014  . Cramps of lower extremity 11/05/2014  . Chronic LBP 11/05/2014  . Chronic bronchitis ([place]) 11/05/2014  . Cystitis 11/05/2014  . Diverticulosis of colon 11/05/2014  . Dyslipidemia 11/05/2014  . Elevated erythrocyte sedimentation rate 11/05/2014  . Gastro-esophageal reflux disease without esophagitis 11/05/2014  . Bleeding internal hemorrhoids 11/05/2014  . Disease of spleen 11/05/2014  . Perennial allergic rhinitis 11/05/2014  . Obesity (BMI 30-39.9) 11/05/2014  . Hemorrhage of rectum and anus 11/05/2014  . Rotator cuff syndrome 11/05/2014  . Leg varices 11/05/2014  . Vitamin D deficiency 11/05/2014  . DJD of shoulder 10/16/2014  . DDD (degenerative disc disease), lumbosacral 09/18/2014  . DDD (degenerative disc disease), cervical 09/18/2014  . DJD (degenerative joint disease) 09/18/2014  . Neuropathy due to secondary diabetes ([place]) 09/18/2014  . Diabetes mellitus type 2, uncontrolled ([place]) 07/14/2014  . Long term current use of insulin ([place]) 07/14/2014  . Arthritis, degenerative 09/30/2013  . Arthritis or polyarthritis, rheumatoid ([place]) 09/30/2013    Rosalyn Gess OTR/L,CLT  09/14/2015, 12:59 PM  [facility] [address] [place], [address] Phone: [phone]   Fax:  [phone]  Name: ROSELY KALLA MRN: [id number] Date of Birth: Nov 14, 1955

## 2015-09-16 ENCOUNTER — Other Ambulatory Visit: Payer: Self-pay | Admitting: Pain Medicine

## 2015-09-16 ENCOUNTER — Encounter: Payer: Self-pay | Admitting: Pain Medicine

## 2015-09-16 ENCOUNTER — Ambulatory Visit: Payer: Medicare Other | Attending: Pain Medicine | Admitting: Pain Medicine

## 2015-09-16 VITALS — BP 149/92 | HR 74 | Temp 98.2°F | Resp 16 | Ht 66.0 in | Wt 233.0 lb

## 2015-09-16 DIAGNOSIS — M5126 Other intervertebral disc displacement, lumbar region: Secondary | ICD-10-CM | POA: Diagnosis not present

## 2015-09-16 DIAGNOSIS — E1140 Type 2 diabetes mellitus with diabetic neuropathy, unspecified: Secondary | ICD-10-CM | POA: Insufficient documentation

## 2015-09-16 DIAGNOSIS — M5136 Other intervertebral disc degeneration, lumbar region: Secondary | ICD-10-CM | POA: Diagnosis not present

## 2015-09-16 DIAGNOSIS — E1340 Other specified diabetes mellitus with diabetic neuropathy, unspecified: Secondary | ICD-10-CM | POA: Diagnosis not present

## 2015-09-16 DIAGNOSIS — K859 Acute pancreatitis without necrosis or infection, unspecified: Secondary | ICD-10-CM | POA: Insufficient documentation

## 2015-09-16 DIAGNOSIS — M461 Sacroiliitis, not elsewhere classified: Secondary | ICD-10-CM | POA: Diagnosis not present

## 2015-09-16 DIAGNOSIS — M542 Cervicalgia: Secondary | ICD-10-CM | POA: Diagnosis present

## 2015-09-16 DIAGNOSIS — M4806 Spinal stenosis, lumbar region: Secondary | ICD-10-CM | POA: Diagnosis not present

## 2015-09-16 DIAGNOSIS — M150 Primary generalized (osteo)arthritis: Secondary | ICD-10-CM

## 2015-09-16 DIAGNOSIS — M19019 Primary osteoarthritis, unspecified shoulder: Secondary | ICD-10-CM | POA: Diagnosis not present

## 2015-09-16 DIAGNOSIS — M51379 Other intervertebral disc degeneration, lumbosacral region without mention of lumbar back pain or lower extremity pain: Secondary | ICD-10-CM

## 2015-09-16 DIAGNOSIS — M19011 Primary osteoarthritis, right shoulder: Secondary | ICD-10-CM

## 2015-09-16 DIAGNOSIS — M792 Neuralgia and neuritis, unspecified: Secondary | ICD-10-CM | POA: Diagnosis not present

## 2015-09-16 DIAGNOSIS — M79601 Pain in right arm: Secondary | ICD-10-CM | POA: Diagnosis present

## 2015-09-16 DIAGNOSIS — M19012 Primary osteoarthritis, left shoulder: Secondary | ICD-10-CM

## 2015-09-16 DIAGNOSIS — M5030 Other cervical disc degeneration, unspecified cervical region: Secondary | ICD-10-CM | POA: Diagnosis not present

## 2015-09-16 DIAGNOSIS — M069 Rheumatoid arthritis, unspecified: Secondary | ICD-10-CM | POA: Diagnosis not present

## 2015-09-16 DIAGNOSIS — M5416 Radiculopathy, lumbar region: Secondary | ICD-10-CM | POA: Diagnosis not present

## 2015-09-16 DIAGNOSIS — M160 Bilateral primary osteoarthritis of hip: Secondary | ICD-10-CM

## 2015-09-16 DIAGNOSIS — M159 Polyosteoarthritis, unspecified: Secondary | ICD-10-CM

## 2015-09-16 DIAGNOSIS — M5137 Other intervertebral disc degeneration, lumbosacral region: Secondary | ICD-10-CM

## 2015-09-16 DIAGNOSIS — M79602 Pain in left arm: Secondary | ICD-10-CM | POA: Diagnosis present

## 2015-09-16 MED ORDER — GABAPENTIN 300 MG PO CAPS: ORAL_CAPSULE | ORAL | Status: DC

## 2015-09-16 MED ORDER — HYDROCODONE-ACETAMINOPHEN 5-325 MG PO TABS: ORAL_TABLET | ORAL | Status: DC

## 2015-09-16 NOTE — Progress Notes (Signed)
Safety precautions to be maintained throughout the outpatient stay will include: orient to surroundings, keep bed in low position, maintain call bell within reach at all times, provide assistance with transfer out of bed and ambulation.  

## 2015-09-16 NOTE — Patient Instructions (Addendum)
PLAN   Continue present medication Neurontin and hydrocodone acetaminophen  F/U PCP Dr. Hortencia Pilar  for evaliation of  BP diabetes mellitus rheumatoid arthritis and general medical  condition. Also discuss gout arthritis pancreatitis colonoscopy endoscopy and lesion of liver as we previously mentioned to you  F/U Dr.Soles for evaluation of diabetes mellitus as discussed   F/U surgical evaluation. Follow-up as planned with Dr. Mack Guise  F/U Dr. Vira Agar regarding GI studies . Patient with evidence of esophagitis will follow-up with Dr. Vira Agar as planned. Scan for fatty tissue of liver as planned  Rheumatological follow-up evaluation with Dr. Teodoro Spray as discussed and continue medication as prescribed by Dr. Jefm Bryant  F/U neurological evaluation. May consider PNCV/EMG studies and other studies pending follow-up evaluations  May consider radiofrequency rhizolysis or intraspinal procedures pending response to present treatment and F/U evaluation   Patient to call Pain Management Center should patient have concerns prior to scheduled return appointment.

## 2015-09-17 ENCOUNTER — Ambulatory Visit
Admission: RE | Admit: 2015-09-17 | Discharge: 2015-09-17 | Disposition: A | Discharge status: Home / Self Care | Payer: Medicare Other | Source: Ambulatory Visit | Attending: Physician Assistant | Admitting: Physician Assistant

## 2015-09-17 DIAGNOSIS — K760 Fatty (change of) liver, not elsewhere classified: Secondary | ICD-10-CM | POA: Insufficient documentation

## 2015-09-17 DIAGNOSIS — I77819 Aortic ectasia, unspecified site: Secondary | ICD-10-CM | POA: Diagnosis not present

## 2015-09-17 NOTE — Progress Notes (Signed)
Subjective:    Patient ID: Charlotte [name], female    DOB: [birth year], 59 y.o.   MRN: [id number]  HPI  The patient is a 60 year old female who returns to pain management for further evaluation and treatment of pain involving neck upper extremity regions lower back and lower extremity regions. The patient states that she recently was informed that she has rheumatoid arthritis. The patient states that the most severe pain at the present time is in her hands. The patient is to undergo surgical intervention of the shoulder by Dr. Mack Guise once patient has optimal control of her diabetes mellitus. The patient has been planning surgery of the shoulder for significant period of time. We have avoided interventional treatment in attempt to avoid any aggravation of patient's diabetes mellitus as well. The patient states that medications have been significant benefit in terms of reducing her pain and patient continues Neurontin and hydrocodone acetaminophen at this time. The patient will continue the care of her primary care physician Dr. Hortencia Pilar for further evaluation and treatment of her diabetes mellitus rheumatoid arthritis pancreatitis and lesions of liver. The patient will follow-up with Dr. Vira Agar for her GI conditions as discussed. We will continue Neurontin and hydrocodone acetaminophen for treatment of patient's pain involving the cervical region shoulders lumbar and lower extremity regions and patient is to call pain management for any change in condition or other concerns. All agreed to suggested treatment plan    Review of Systems     Objective:   Physical Exam  There was tenderness to palpation of the splenius capitis and occipitalis musculature region of moderate degree with moderate tenderness over the cervical facet cervical paraspinal musculature region with moderate tenderness of the thoracic paraspinal musculature region with no crepitus of the thoracic region noted. There was  moderately severe tenderness to palpation of the acromioclavicular and glenohumeral humeral regions with significant degenerative range of motion especially involving the left shoulder. The patient has significantly decreased grip strength of the left hand with slight increased warmth and swelling of the digits of the left hand. There was tends to palpation of the lumbar paraspinal must reason lumbar facet region a moderate degree with lateral bending rotation extension and palpation of the lumbar facets reproducing moderately severe discomfort. There was severe tenderness to palpation of the PSIS and PII S region as well as the gluteal and piriformis musculature regions with moderate tenderness to palpation of the greater trochanteric region and iliotibial band region. No definite sensory deficit of dermatomal distribution was detected. Straight leg raising was tolerates approximately 20 without a definite increase of pain with dorsiflexion noted. EHL strength was decreased. DTRs were difficult to elicit The patient had difficulty relaxing. There was negative clonus negative Homans.. 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 Hortencia Pilar  for evaliation of  BP diabetes mellitus rheumatoid arthritis and general medical  condition. Also discuss gout arthritis pancreatitis colonoscopy endoscopy and lesion of liver as we previously  mentioned to you  F/U Dr.Soles for evaluation of diabetes mellitus as discussed   F/U surgical evaluation. Follow-up as planned with Dr. Mack Guise  F/U Dr. Vira Agar regarding GI studies . Patient with evidence of esophagitis will follow-up with Dr. Vira Agar as planned. Scan for fatty tissue of liver as planned  Rheumatological follow-up evaluation with Dr. Teodoro Spray as discussed and continue medication as prescribed by Dr. Jefm Bryant  F/U neurological evaluation. May consider PNCV/EMG studies and other studies pending follow-up evaluations  May consider radiofrequency rhizolysis or intraspinal procedures pending response to present treatment and F/U evaluation   Patient to call Pain Management Center should patient have concerns prior to scheduled return appointment.

## 2015-09-20 ENCOUNTER — Other Ambulatory Visit: Payer: Self-pay | Admitting: Physician Assistant

## 2015-09-20 DIAGNOSIS — K760 Fatty (change of) liver, not elsewhere classified: Secondary | ICD-10-CM

## 2015-09-20 DIAGNOSIS — R935 Abnormal findings on diagnostic imaging of other abdominal regions, including retroperitoneum: Secondary | ICD-10-CM

## 2015-09-21 DIAGNOSIS — E1165 Type 2 diabetes mellitus with hyperglycemia: Secondary | ICD-10-CM | POA: Diagnosis not present

## 2015-09-21 DIAGNOSIS — Z23 Encounter for immunization: Secondary | ICD-10-CM | POA: Diagnosis not present

## 2015-09-21 DIAGNOSIS — E1129 Type 2 diabetes mellitus with other diabetic kidney complication: Secondary | ICD-10-CM | POA: Diagnosis not present

## 2015-09-21 DIAGNOSIS — I10 Essential (primary) hypertension: Secondary | ICD-10-CM | POA: Diagnosis not present

## 2015-09-21 DIAGNOSIS — Z794 Long term (current) use of insulin: Secondary | ICD-10-CM | POA: Diagnosis not present

## 2015-09-21 DIAGNOSIS — K760 Fatty (change of) liver, not elsewhere classified: Secondary | ICD-10-CM | POA: Diagnosis not present

## 2015-09-21 DIAGNOSIS — R809 Proteinuria, unspecified: Secondary | ICD-10-CM | POA: Diagnosis not present

## 2015-09-21 DIAGNOSIS — Z1231 Encounter for screening mammogram for malignant neoplasm of breast: Secondary | ICD-10-CM | POA: Diagnosis not present

## 2015-09-21 DIAGNOSIS — I8390 Asymptomatic varicose veins of unspecified lower extremity: Secondary | ICD-10-CM | POA: Diagnosis not present

## 2015-09-21 DIAGNOSIS — E559 Vitamin D deficiency, unspecified: Secondary | ICD-10-CM | POA: Diagnosis not present

## 2015-09-23 ENCOUNTER — Ambulatory Visit
Admission: RE | Admit: 2015-09-23 | Discharge: 2015-09-23 | Disposition: A | Discharge status: Home / Self Care | Payer: Medicare Other | Source: Ambulatory Visit | Attending: Physician Assistant | Admitting: Physician Assistant

## 2015-09-23 DIAGNOSIS — K760 Fatty (change of) liver, not elsewhere classified: Secondary | ICD-10-CM | POA: Insufficient documentation

## 2015-09-23 DIAGNOSIS — R935 Abnormal findings on diagnostic imaging of other abdominal regions, including retroperitoneum: Secondary | ICD-10-CM | POA: Insufficient documentation

## 2015-09-24 ENCOUNTER — Ambulatory Visit: Payer: Medicare Other | Admitting: Occupational Therapy

## 2015-09-24 DIAGNOSIS — M6281 Muscle weakness (generalized): Secondary | ICD-10-CM | POA: Diagnosis not present

## 2015-09-24 DIAGNOSIS — M25642 Stiffness of left hand, not elsewhere classified: Secondary | ICD-10-CM | POA: Diagnosis not present

## 2015-09-24 DIAGNOSIS — M25641 Stiffness of right hand, not elsewhere classified: Secondary | ICD-10-CM

## 2015-09-24 DIAGNOSIS — M79642 Pain in left hand: Secondary | ICD-10-CM

## 2015-09-24 DIAGNOSIS — M79641 Pain in right hand: Secondary | ICD-10-CM

## 2015-09-24 NOTE — Patient Instructions (Signed)
   Pt fitted with bilateral CMC neoprene splints to use during activities for bilateral thumb and CMC pain - did had decrease pain and increase prehension on L hand with splints  Pt demo understanding and donning/doffing   Also reviewed again using joint protection principles - and AE use to decrease pain

## 2015-09-24 NOTE — Therapy (Signed)
[facility] [address] [place], [address] Phone: [phone]   Fax:  [phone]  Occupational Therapy Treatment  Patient Details  Name: Charlotte [name] MRN: [id number] Date of Birth: 02/20/1956 Referring Provider: Jefm Bryant   Encounter Date: 09/24/2015      OT End of Session - 09/24/15 0820    Visit Number 2   Number of Visits 8   Date for OT Re-Evaluation 10/12/15   OT Start Time 0802   OT Stop Time 0846   OT Time Calculation (min) 44 min   Activity Tolerance Patient tolerated treatment well   Behavior During Therapy [facility] for tasks assessed/performed      Past Medical History  Diagnosis Date  . Depression   . Diabetes mellitus without complication ([place])   . Hypercholesteremia   . DDD (degenerative disc disease), lumbar   . Fatty liver   . Spinal stenosis   . IBS (irritable bowel syndrome)   . Hemorrhoids   . Diverticulitis   . GERD (gastroesophageal reflux disease)   . Hypertension   . Fibroid tumor   . H pylori ulcer     Past Surgical History  Procedure Laterality Date  . Abdominal hysterectomy      partial  . Foot surgery    . Cholecystectomy    . Shoulder surgery Right   . Back surgery    . Neck surgery    . Ectopic pregnancy surgery    . Esophagogastroduodenoscopy (egd) with propofol N/A 08/18/2015    Procedure: ESOPHAGOGASTRODUODENOSCOPY (EGD) WITH PROPOFOL;  Surgeon: Manya Silvas, MD;  Location: [place];  Service: Endoscopy;  Laterality: N/A;  . Colonoscopy with propofol N/A 08/18/2015    Procedure: COLONOSCOPY WITH PROPOFOL;  Surgeon: Manya Silvas, MD;  [facility] ENDOSCOPY;  Service: Endoscopy;  Laterality: N/A;    There were no vitals filed for this visit.      Subjective Assessment - 09/24/15 1710    Subjective  Pain is better but still in the L index knuckle and R thumb - inflammation is better - not as swollen - I did the heat and cold like you told me     Patient Stated Goals Pain better and to see what there are that helps instead of meds - adn use my hands better    Currently in Pain? Yes   Pain Score 7    Pain Location Hand   Pain Orientation Right;Left   Pain Descriptors / Indicators Aching            [facility] OT Assessment - 09/24/15 0001    Strength   Right Hand Grip (lbs) 25   Right Hand Lateral Pinch 8 lbs   Right Hand 3 Point Pinch 8 lbs   Left Hand Grip (lbs) 10   Left Hand Lateral Pinch 8 lbs   Left Hand 3 Point Pinch 9 lbs   Right Hand AROM   R Index  MCP 0-90 80 Degrees   R Index PIP 0-100 90 Degrees   R Long  MCP 0-90 90 Degrees   R Long PIP 0-100 95 Degrees   R Ring  MCP 0-90 90 Degrees   R Ring PIP 0-100 95 Degrees   R Little  MCP 0-90 90 Degrees   R Little PIP 0-100 90 Degrees   Left Hand AROM   L Index  MCP 0-90 80 Degrees   L Index PIP 0-100 90 Degrees   L Long  MCP 0-90 90 Degrees   L Long PIP 0-100 90 Degrees   L Ring  MCP 0-90 90 Degrees   L Ring PIP 0-100 90 Degrees   L Little  MCP 0-90 90 Degrees   L Little PIP 0-100 90 Degrees                  OT Treatments/Exercises (OP) - 09/24/15 0001    Ultrasound   Ultrasound Location Volar R andL 2nd A1pulley at end of session    Ultrasound Parameters 3.3MHZ, 1.0 intensity , 20%    Ultrasound Goals Pain   RUE Paraffin   Number Minutes Paraffin 10 Minutes   RUE Paraffin Location Hand   Comments Decrease pain and stiffness at [facility]    LUE Paraffin   Number Minutes Paraffin 10 Minutes   LUE Paraffin Location Hand   Comments decrease pain and sitffnes- at [facility]       After parafin - pain did decrease 3-4/10  Reviewed AROM for tendon glides - measurements taken and grip /prehension - some progress - see flowsheet  ROM in L 2nd digits and pain improved greatly   Pt fitted with bilateral CMC neoprene splints to use during activities for bilateral thumb and CMC pain - did had decrease pain and increase prehension on L hand with splints  Pt demo  understanding and donning/doffing   Also reviewed again using joint protection principles - and AE use to decrease pain              OT Education - 09/24/15 0820    Education provided Yes   Education Details HEP   Person(s) Educated Patient   Methods Explanation;Demonstration;Tactile cues;Verbal cues   Comprehension Verbal cues required;Returned demonstration;Verbalized understanding          OT Short Term Goals - 09/14/15 1245    OT SHORT TERM GOAL #1   Title Pain on PRWHE improve with 15 points    Baseline at eval 44/50 for pain score on PRWHE   Time 3   Period Weeks   Status New   OT SHORT TERM GOAL #2   Title AROM in digits improve for pt to touch palm wtih pain less than 3/10    Baseline pain at rest 8/10 and MC's and PIP's impaired - see flowsheet   Time 4   Period Weeks   Status New           OT Long Term Goals - 09/14/15 1254    OT LONG TERM GOAL #1   Title Pt able to verbalize 3 joint protection and AE to increase ease in ADL's and function on PRWHE by 15 points    Baseline very little knowledge - 46/50 for function on PRWHE    Time 4   Period Weeks   Status New               Plan - 09/24/15 1717    Clinical Impression Statement Pt  showed this date decrease edema and pain in bilateral hands - with L 2nd most improved - increase Flexon of all digits - pt cont to have pain in 2nd L and R MC's - over A1 pulleys - pt was fitted with CMCneoprene splnts to use during act to decrease thumb pain - and need to cont with modifiying act joint protection    Rehab Potential Fair   OT Frequency 1x / week   OT Duration 4 weeks   OT Treatment/Interventions Self-care/ADL training;Ultrasound;Contrast Bath;Fluidtherapy;Parrafin;DME and/or  AE instruction;Splinting;Patient/family education;Therapeutic exercise;Manual Therapy;Passive range of motion   Plan assess pain and progress in ROM - splint use - modiying act    OT Home Exercise Plan see pt instruction     Consulted and Agree with Plan of Care Patient      Patient will benefit from skilled therapeutic intervention in order to improve the following deficits and impairments:  Impaired flexibility, Decreased range of motion, Decreased coordination, Decreased strength, Impaired UE functional use, Pain, Decreased knowledge of use of DME, Increased edema  Visit Diagnosis: Pain in left hand  Pain in right hand  Stiffness of left hand, not elsewhere classified  Stiffness of right hand, not elsewhere classified  Muscle weakness (generalized)    Problem List Patient Active Problem List   Diagnosis Date Noted  . Elevated uric acid in blood 12/02/2014  . B12 deficiency 11/05/2014  . Benign essential HTN 11/05/2014  . Cramps of lower extremity 11/05/2014  . Chronic LBP 11/05/2014  . Chronic bronchitis ([place]) 11/05/2014  . Cystitis 11/05/2014  . Diverticulosis of colon 11/05/2014  . Dyslipidemia 11/05/2014  . Elevated erythrocyte sedimentation rate 11/05/2014  . Gastro-esophageal reflux disease without esophagitis 11/05/2014  . Bleeding internal hemorrhoids 11/05/2014  . Disease of spleen 11/05/2014  . Perennial allergic rhinitis 11/05/2014  . Obesity (BMI 30-39.9) 11/05/2014  . Hemorrhage of rectum and anus 11/05/2014  . Rotator cuff syndrome 11/05/2014  . Leg varices 11/05/2014  . Vitamin D deficiency 11/05/2014  . DJD of shoulder 10/16/2014  . DDD (degenerative disc disease), lumbosacral 09/18/2014  . DDD (degenerative disc disease), cervical 09/18/2014  . DJD (degenerative joint disease) 09/18/2014  . Neuropathy due to secondary diabetes ([place]) 09/18/2014  . Diabetes mellitus type 2, uncontrolled ([place]) 07/14/2014  . Long term current use of insulin ([place]) 07/14/2014  . Arthritis, degenerative 09/30/2013  . Arthritis or polyarthritis, rheumatoid ([place]) 09/30/2013    Rosalyn Gess OTR/L,CLT  09/24/2015, 5:23 PM  [place] [facility] [address] [place], [address] Phone: [phone]   Fax:  [phone]  Name: Charlotte [name] MRN: [id number] Date of Birth: Feb 12, 1956

## 2015-09-27 DIAGNOSIS — M79641 Pain in right hand: Secondary | ICD-10-CM | POA: Diagnosis not present

## 2015-09-27 DIAGNOSIS — M150 Primary generalized (osteo)arthritis: Secondary | ICD-10-CM | POA: Diagnosis not present

## 2015-09-27 DIAGNOSIS — M65331 Trigger finger, right middle finger: Secondary | ICD-10-CM | POA: Diagnosis not present

## 2015-09-27 DIAGNOSIS — M0579 Rheumatoid arthritis with rheumatoid factor of multiple sites without organ or systems involvement: Secondary | ICD-10-CM | POA: Diagnosis not present

## 2015-09-30 ENCOUNTER — Ambulatory Visit: Payer: Medicare Other | Admitting: Occupational Therapy

## 2015-09-30 DIAGNOSIS — M79641 Pain in right hand: Secondary | ICD-10-CM

## 2015-09-30 DIAGNOSIS — M25642 Stiffness of left hand, not elsewhere classified: Secondary | ICD-10-CM | POA: Diagnosis not present

## 2015-09-30 DIAGNOSIS — M79642 Pain in left hand: Secondary | ICD-10-CM

## 2015-09-30 DIAGNOSIS — M25641 Stiffness of right hand, not elsewhere classified: Secondary | ICD-10-CM

## 2015-09-30 DIAGNOSIS — M6281 Muscle weakness (generalized): Secondary | ICD-10-CM | POA: Diagnosis not present

## 2015-09-30 NOTE — Therapy (Signed)
[facility] [address] [place], [address] Phone: [phone]   Fax:  [phone]  Occupational Therapy Treatment  Patient Details  Name: Charlotte [name] MRN: [id number] Date of Birth: 28-Dec-1955 Referring Provider: Jefm Bryant   Encounter Date: 09/30/2015      OT End of Session - 09/30/15 0852    Visit Number 3   Number of Visits 3   Date for OT Re-Evaluation 09/30/15   OT Start Time 0805   OT Stop Time 0850   OT Time Calculation (min) 45 min   Activity Tolerance Patient tolerated treatment well   Behavior During Therapy [facility] for tasks assessed/performed      Past Medical History  Diagnosis Date  . Depression   . Diabetes mellitus without complication ([place])   . Hypercholesteremia   . DDD (degenerative disc disease), lumbar   . Fatty liver   . Spinal stenosis   . IBS (irritable bowel syndrome)   . Hemorrhoids   . Diverticulitis   . GERD (gastroesophageal reflux disease)   . Hypertension   . Fibroid tumor   . H pylori ulcer     Past Surgical History  Procedure Laterality Date  . Abdominal hysterectomy      partial  . Foot surgery    . Cholecystectomy    . Shoulder surgery Right   . Back surgery    . Neck surgery    . Ectopic pregnancy surgery    . Esophagogastroduodenoscopy (egd) with propofol N/A 08/18/2015    Procedure: ESOPHAGOGASTRODUODENOSCOPY (EGD) WITH PROPOFOL;  Surgeon: Manya Silvas, MD;  Location: [place];  Service: Endoscopy;  Laterality: N/A;  . Colonoscopy with propofol N/A 08/18/2015    Procedure: COLONOSCOPY WITH PROPOFOL;  Surgeon: Manya Silvas, MD;  Location: [facility] ENDOSCOPY;  Service: Endoscopy;  Laterality: N/A;    There were no vitals filed for this visit.      Subjective Assessment - 09/30/15 0813    Subjective  Seen DR Jefm Bryant - have inflammation at wrist - cyst - and then maybe can do some shots at thumb s and injection for cyst - not as stiff during  day than before ,    Patient Stated Goals Pain better and to see what there are that helps instead of meds - adn use my hands better    Currently in Pain? Yes   Pain Score 6    Pain Location Hand   Pain Orientation Right;Left            [facility] OT Assessment - 09/30/15 0001    Strength   Right Hand Grip (lbs) 25   Right Hand Lateral Pinch 10 lbs   Right Hand 3 Point Pinch 7 lbs   Left Hand Grip (lbs) 15   Left Hand Lateral Pinch 9 lbs   Left Hand 3 Point Pinch 9 lbs   Right Hand AROM   R Index  MCP 0-90 90 Degrees   R Index PIP 0-100 100 Degrees   R Long  MCP 0-90 90 Degrees   R Long PIP 0-100 100 Degrees   R Ring  MCP 0-90 90 Degrees   R Ring PIP 0-100 100 Degrees   R Little  MCP 0-90 90 Degrees   R Little PIP 0-100 90 Degrees   Left Hand AROM   L Index  MCP 0-90 80 Degrees   L Index PIP 0-100 95 Degrees   L Long  MCP 0-90 90 Degrees  L Long PIP 0-100 100 Degrees   L Ring  MCP 0-90 90 Degrees   L Ring PIP 0-100 100 Degrees   L Little  MCP 0-90 90 Degrees   L Little PIP 0-100 90 Degrees        Measurements taken ROM and strength - see flowsheet  did PRWHE for score and compare to eval  Paraffin to bilateral hands  - pt pain decrease after wards  Pt reviewed again tendon glides - has to focus on MC flexion - still wants to make fist with more PIP's than last range of MC s   Pt to cont with HEP for ROM - joint protection review  and reinforce again as well as using paraffin bath  COnt to wear CMC neoprene splints during day with functional act to decrease pain                    OT Education - 09/30/15 0852    Education provided Yes   Education Details HEP and joint protection    Person(s) Educated Patient   Methods Explanation;Demonstration;Tactile cues;Verbal cues   Comprehension Verbal cues required;Returned demonstration;Verbalized understanding          OT Short Term Goals - 09/30/15 0853    OT SHORT TERM GOAL #1   Title Pain on PRWHE  improve with 15 points    Baseline at eval 44/50 for pain score on PRWHE - and still about 45/50 but decrease at rest from 8/10 to 6/10    Status Not Met   OT SHORT TERM GOAL #2   Title AROM in digits improve for pt to touch palm wtih pain less than 3/10    Baseline See flowsheet - great progress   Status Achieved           OT Long Term Goals - 09/30/15 0853    OT LONG TERM GOAL #1   Title Pt able to verbalize 3 joint protection and AE to increase ease in ADL's and function on PRWHE by 15 points    Baseline Function improved from 46 to 15/50 and using joint protection    Status Achieved               Plan - 09/30/15 0854    Clinical Impression Statement Pt made great gains in AROM in digis , grip adn prehension strenght - pain stll issue - but pain at 2nd digit on L improved greatly and tenderness over A1 pulley - pt show increase functional use - pt met all goals except  pain one - pt  discharge with HEP      Patient will benefit from skilled therapeutic intervention in order to improve the following deficits and impairments:     Visit Diagnosis: Pain in left hand  Pain in right hand  Stiffness of left hand, not elsewhere classified  Stiffness of right hand, not elsewhere classified  Muscle weakness (generalized)    Problem List Patient Active Problem List   Diagnosis Date Noted  . Elevated uric acid in blood 12/02/2014  . B12 deficiency 11/05/2014  . Benign essential HTN 11/05/2014  . Cramps of lower extremity 11/05/2014  . Chronic LBP 11/05/2014  . Chronic bronchitis ([place]) 11/05/2014  . Cystitis 11/05/2014  . Diverticulosis of colon 11/05/2014  . Dyslipidemia 11/05/2014  . Elevated erythrocyte sedimentation rate 11/05/2014  . Gastro-esophageal reflux disease without esophagitis 11/05/2014  . Bleeding internal hemorrhoids 11/05/2014  . Disease of spleen 11/05/2014  . Perennial allergic  rhinitis 11/05/2014  . Obesity (BMI 30-39.9) 11/05/2014  .  Hemorrhage of rectum and anus 11/05/2014  . Rotator cuff syndrome 11/05/2014  . Leg varices 11/05/2014  . Vitamin D deficiency 11/05/2014  . DJD of shoulder 10/16/2014  . DDD (degenerative disc disease), lumbosacral 09/18/2014  . DDD (degenerative disc disease), cervical 09/18/2014  . DJD (degenerative joint disease) 09/18/2014  . Neuropathy due to secondary diabetes ([place]) 09/18/2014  . Diabetes mellitus type 2, uncontrolled ([place]) 07/14/2014  . Long term current use of insulin ([place]) 07/14/2014  . Arthritis, degenerative 09/30/2013  . Arthritis or polyarthritis, rheumatoid ([place]) 09/30/2013    Rosalyn Gess OTR/L,CLT  09/30/2015, 9:02 AM  [facility] [address] [place], [address] Phone: [phone]   Fax:  [phone]  Name: SHAKEDA PEARSE MRN: [id number] Date of Birth: 1955/10/28

## 2015-09-30 NOTE — Patient Instructions (Signed)
Pt to cont with same HEP and joint protection  Hand out provided and reviewed this date  Cont with thumb CMC neoprene splints during day when using hand  Ed on getting paraffin bath and when to use -

## 2015-10-12 ENCOUNTER — Ambulatory Visit: Payer: Medicare Other | Attending: Pain Medicine | Admitting: Pain Medicine

## 2015-10-12 ENCOUNTER — Encounter: Payer: Self-pay | Admitting: Pain Medicine

## 2015-10-12 VITALS — BP 161/97 | HR 77 | Temp 97.8°F | Resp 18 | Ht 66.0 in | Wt 230.0 lb

## 2015-10-12 DIAGNOSIS — M1000 Idiopathic gout, unspecified site: Secondary | ICD-10-CM | POA: Diagnosis not present

## 2015-10-12 DIAGNOSIS — M19019 Primary osteoarthritis, unspecified shoulder: Secondary | ICD-10-CM | POA: Insufficient documentation

## 2015-10-12 DIAGNOSIS — E1140 Type 2 diabetes mellitus with diabetic neuropathy, unspecified: Secondary | ICD-10-CM | POA: Diagnosis not present

## 2015-10-12 DIAGNOSIS — M150 Primary generalized (osteo)arthritis: Secondary | ICD-10-CM

## 2015-10-12 DIAGNOSIS — M160 Bilateral primary osteoarthritis of hip: Secondary | ICD-10-CM

## 2015-10-12 DIAGNOSIS — M19011 Primary osteoarthritis, right shoulder: Secondary | ICD-10-CM

## 2015-10-12 DIAGNOSIS — M4806 Spinal stenosis, lumbar region: Secondary | ICD-10-CM | POA: Insufficient documentation

## 2015-10-12 DIAGNOSIS — M5126 Other intervertebral disc displacement, lumbar region: Secondary | ICD-10-CM | POA: Diagnosis not present

## 2015-10-12 DIAGNOSIS — M19012 Primary osteoarthritis, left shoulder: Secondary | ICD-10-CM

## 2015-10-12 DIAGNOSIS — M159 Polyosteoarthritis, unspecified: Secondary | ICD-10-CM

## 2015-10-12 DIAGNOSIS — M542 Cervicalgia: Secondary | ICD-10-CM | POA: Diagnosis present

## 2015-10-12 DIAGNOSIS — M069 Rheumatoid arthritis, unspecified: Secondary | ICD-10-CM | POA: Insufficient documentation

## 2015-10-12 DIAGNOSIS — M5030 Other cervical disc degeneration, unspecified cervical region: Secondary | ICD-10-CM | POA: Diagnosis not present

## 2015-10-12 DIAGNOSIS — K760 Fatty (change of) liver, not elsewhere classified: Secondary | ICD-10-CM | POA: Diagnosis not present

## 2015-10-12 DIAGNOSIS — M5136 Other intervertebral disc degeneration, lumbar region: Secondary | ICD-10-CM | POA: Insufficient documentation

## 2015-10-12 DIAGNOSIS — E1340 Other specified diabetes mellitus with diabetic neuropathy, unspecified: Secondary | ICD-10-CM

## 2015-10-12 DIAGNOSIS — M461 Sacroiliitis, not elsewhere classified: Secondary | ICD-10-CM | POA: Diagnosis not present

## 2015-10-12 DIAGNOSIS — M5416 Radiculopathy, lumbar region: Secondary | ICD-10-CM | POA: Diagnosis not present

## 2015-10-12 DIAGNOSIS — M792 Neuralgia and neuritis, unspecified: Secondary | ICD-10-CM | POA: Diagnosis not present

## 2015-10-12 DIAGNOSIS — M5137 Other intervertebral disc degeneration, lumbosacral region: Secondary | ICD-10-CM

## 2015-10-12 MED ORDER — HYDROCODONE-ACETAMINOPHEN 5-325 MG PO TABS
ORAL_TABLET | ORAL | Status: DC
Start: 2015-10-12 — End: 2015-11-11

## 2015-10-12 NOTE — Progress Notes (Signed)
Safety precautions to be maintained throughout the outpatient stay will include: orient to surroundings, keep bed in low position, maintain call bell within reach at all times, provide assistance with transfer out of bed and ambulation.  

## 2015-10-12 NOTE — Patient Instructions (Addendum)
PLAN   Continue present medication Neurontin and hydrocodone acetaminophen  F/U PCP Dr. Hortencia Pilar  for evaliation of  BP diabetes mellitus rheumatoid arthritis and general medical  condition. Also discuss gout arthritis pancreatitis, colonoscopy, endoscopy, and lesion of fatty liver as we previously mentioned to you  F/U Dr.Soles for evaluation of diabetes mellitus as discussed   F/U surgical evaluation. Follow-up as planned with Dr. Mack Guise  F/U Dr. Vira Agar regarding GI studies . Patient with evidence of esophagitis will follow-up with Dr. Vira Agar as planned regarding status of esophagitis as well as further evaluation of pancreas liver: and general GI evaluation   Rheumatological follow-up evaluation with Dr. Teodoro Spray as discussed and continue medication as prescribed by Dr. Jefm Bryant  F/U neurological evaluation. May consider PNCV/EMG studies and other studies pending follow-up evaluations  May consider radiofrequency rhizolysis or intraspinal procedures pending response to present treatment and F/U evaluation   Patient to call Pain Management Center should patient have concerns prior to scheduled return appointment.

## 2015-10-12 NOTE — Progress Notes (Signed)
Subjective:    Patient ID: Charlotte [name], female    DOB: 1955-07-05, 60 y.o.   MRN: [id number]  HPI  The patient is a 60 year old female who returns to pain management for further evaluation and treatment of pain involving the neck upper extremity regions shoulder especially the left shoulder as well as lower back and lower extremity region. The patient states that the pain is fairly well-controlled at this time. The patient will follow-up with Dr. Mack Guise regarding her shoulder. The patient has been considering surgical intervention of the shoulder. The patient states that she was seen by Dr Cindie Laroche Leeanne Mannan and that she is tolerating wax baths which have been helpful in terms of decreasing pain of the hands. The patient may need to undergo surgery for trigger finger of the hands as discussed. The patient will follow-up in regards to her GI condition for further evaluation of pancreatitis, fatty liver and other abnormalities of the liver, colonoscopy results, endoscopy results, and gout as well. The patient denies any trauma change in events of daily living the call significant change in symptomatology. We will continue patient's Neurontin and hydrocodone acetaminophen at this time and will consider modifications of treatment should they be significant change in condition. All agreed to suggested treatment plan.   Review of Systems     Objective:   Physical Exam   There was tenderness to palpation of the splenius capitis and occipitalis musculature regions palpation which be produced mild to moderate discomfort. The patient appeared to be with unremarkable Spurling's maneuver with tenderness over the cervical facet cervical paraspinal musculature region a mild to moderate degree. The patient appeared to be with decreased grip strength with Tinel and Phalen's maneuver reproducing moderate discomfort. The patient appeared to be with moderate tenderness of the acromioclavicular and glenohumeral  joint regions. The patient appeared to be with unremarkable Spurling's maneuver. Palpation over the thoracic region thoracic facet region was attends to palpation with no crepitus of the thoracic region with evidence of moderate muscle spasms involving the thoracic musculature region. Palpation over the lumbar paraspinal musculature region lumbar facet region was attends to palpation with lateral bending rotation extension and palpation of the lumbar facets reproducing moderate discomfort. There was moderate tenderness of the PSIS PII S region as well as the greater trochanteric region and iliotibial band region. No definite sensory deficit of dermatomal dystrophy she was detected and there was negative clonus negative Homans. DTRs were difficult to elicit patient difficult relaxing. 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 Hortencia Pilar  for evaliation of  BP diabetes mellitus rheumatoid arthritis and general medical  condition. Also discuss gout arthritis pancreatitis, colonoscopy, endoscopy, and lesion of fatty liver as we previously mentioned to you  F/U Dr.Soles for evaluation of diabetes mellitus as discussed   F/U surgical evaluation. Follow-up as planned with Dr. Mack Guise  F/U Dr. Vira Agar regarding GI studies . Patient with evidence of esophagitis will follow-up with Dr. Vira Agar as planned regarding status of esophagitis as well as further evaluation of  pancreas liver: and  general GI evaluation   Rheumatological follow-up evaluation with Dr. Teodoro Spray as discussed and continue medication as prescribed by Dr. Jefm Bryant  F/U neurological evaluation. May consider PNCV/EMG studies and other studies pending follow-up evaluations  May consider radiofrequency rhizolysis or intraspinal procedures pending response to present treatment and F/U evaluation   Patient to call [facility] should patient have concerns prior to scheduled return appointment.

## 2015-11-05 DIAGNOSIS — M0579 Rheumatoid arthritis with rheumatoid factor of multiple sites without organ or systems involvement: Secondary | ICD-10-CM | POA: Diagnosis not present

## 2015-11-08 ENCOUNTER — Other Ambulatory Visit: Payer: Self-pay | Admitting: Family Medicine

## 2015-11-08 NOTE — Telephone Encounter (Signed)
Patient requesting refill. 

## 2015-11-10 DIAGNOSIS — M79641 Pain in right hand: Secondary | ICD-10-CM | POA: Diagnosis not present

## 2015-11-10 DIAGNOSIS — M0579 Rheumatoid arthritis with rheumatoid factor of multiple sites without organ or systems involvement: Secondary | ICD-10-CM | POA: Diagnosis not present

## 2015-11-10 DIAGNOSIS — M65331 Trigger finger, right middle finger: Secondary | ICD-10-CM | POA: Diagnosis not present

## 2015-11-11 ENCOUNTER — Ambulatory Visit: Payer: Medicare Other | Attending: Pain Medicine | Admitting: Pain Medicine

## 2015-11-11 ENCOUNTER — Encounter: Payer: Self-pay | Admitting: Pain Medicine

## 2015-11-11 VITALS — BP 158/91 | HR 76 | Temp 98.0°F | Resp 16 | Ht 66.0 in | Wt 232.0 lb

## 2015-11-11 DIAGNOSIS — M546 Pain in thoracic spine: Secondary | ICD-10-CM | POA: Diagnosis present

## 2015-11-11 DIAGNOSIS — M19019 Primary osteoarthritis, unspecified shoulder: Secondary | ICD-10-CM | POA: Insufficient documentation

## 2015-11-11 DIAGNOSIS — M5126 Other intervertebral disc displacement, lumbar region: Secondary | ICD-10-CM | POA: Insufficient documentation

## 2015-11-11 DIAGNOSIS — M19011 Primary osteoarthritis, right shoulder: Secondary | ICD-10-CM

## 2015-11-11 DIAGNOSIS — M069 Rheumatoid arthritis, unspecified: Secondary | ICD-10-CM | POA: Insufficient documentation

## 2015-11-11 DIAGNOSIS — M19012 Primary osteoarthritis, left shoulder: Secondary | ICD-10-CM

## 2015-11-11 DIAGNOSIS — M792 Neuralgia and neuritis, unspecified: Secondary | ICD-10-CM | POA: Diagnosis not present

## 2015-11-11 DIAGNOSIS — M5416 Radiculopathy, lumbar region: Secondary | ICD-10-CM | POA: Diagnosis not present

## 2015-11-11 DIAGNOSIS — M5137 Other intervertebral disc degeneration, lumbosacral region: Secondary | ICD-10-CM

## 2015-11-11 DIAGNOSIS — M4806 Spinal stenosis, lumbar region: Secondary | ICD-10-CM | POA: Diagnosis not present

## 2015-11-11 DIAGNOSIS — M160 Bilateral primary osteoarthritis of hip: Secondary | ICD-10-CM

## 2015-11-11 DIAGNOSIS — M5030 Other cervical disc degeneration, unspecified cervical region: Secondary | ICD-10-CM | POA: Diagnosis not present

## 2015-11-11 DIAGNOSIS — M159 Polyosteoarthritis, unspecified: Secondary | ICD-10-CM

## 2015-11-11 DIAGNOSIS — E1140 Type 2 diabetes mellitus with diabetic neuropathy, unspecified: Secondary | ICD-10-CM | POA: Insufficient documentation

## 2015-11-11 DIAGNOSIS — M150 Primary generalized (osteo)arthritis: Secondary | ICD-10-CM

## 2015-11-11 DIAGNOSIS — E1340 Other specified diabetes mellitus with diabetic neuropathy, unspecified: Secondary | ICD-10-CM | POA: Diagnosis not present

## 2015-11-11 DIAGNOSIS — M5136 Other intervertebral disc degeneration, lumbar region: Secondary | ICD-10-CM | POA: Insufficient documentation

## 2015-11-11 DIAGNOSIS — M461 Sacroiliitis, not elsewhere classified: Secondary | ICD-10-CM | POA: Diagnosis not present

## 2015-11-11 DIAGNOSIS — M25511 Pain in right shoulder: Secondary | ICD-10-CM | POA: Diagnosis present

## 2015-11-11 DIAGNOSIS — M25512 Pain in left shoulder: Secondary | ICD-10-CM | POA: Diagnosis present

## 2015-11-11 MED ORDER — HYDROCODONE-ACETAMINOPHEN 5-325 MG PO TABS: ORAL_TABLET | ORAL | Status: DC

## 2015-11-11 NOTE — Patient Instructions (Signed)
PLAN   Continue present medications Neurontin and hydrocodone acetaminophen  F/U PCP Dr. Hortencia Pilar  for evaliation of  BP diabetes mellitus rheumatoid arthritis and general medical  condition. Continue to address gout, arthritis, pancreatitis, colonoscopy, endoscopy, and lesion of fatty liver as we previously mentioned to you  F/U Dr.Soles for evaluation of diabetes mellitus   F/U surgical evaluation. Follow-up as planned with Dr. Mack Guise for evaluation of shoulder and for general orthopedic evaluation  F/U Dr. Vira Agar regarding GI studies . Patient with evidence of esophagitis will follow-up with Dr. Vira Agar as planned regarding status of esophagitis as well as further evaluation of pancreas liver: and general GI evaluation   Rheumatological follow-up evaluation with Dr. Teodoro Spray as discussed and continue medication as prescribed by Dr. Jefm Bryant  F/U neurological evaluation. May consider PNCV/EMG studies and other studies pending follow-up evaluations  May consider radiofrequency rhizolysis or intraspinal procedures pending response to present treatment and F/U evaluation   Patient to call [facility] should patient have concerns prior to scheduled return appointment.

## 2015-11-11 NOTE — Progress Notes (Signed)
Subjective:    Patient ID: Charlotte [name], female    DOB: Oct 08, 1955, 60 y.o.   MRN: [id number]  HPI  The patient is a 60 year old female who returns to pain management for further evaluation and treatment of pain involving the upper back shoulders mid back lower back and lower extremity region. The patient also has diagnoses of diabetes mellitus and rheumatoid arthritis. The patient stated that she recently experienced significant swelling of the right hand. The patient was prescribed steroid by Dr. Merita Norton . The patient states that she continues to have significant pain involving the region of the hand. We discussed patient's condition and we will continue to observe patient's response to present treatment regimen. The patient will also follow up with Dr. Mack Guise regarding pain of the left shoulder with plans for surgical intervention of the left shoulder. We discussed patient's condition and present time we will continue Neurontin and hydrocodone and we will remain available to consider modifications of treatment regimen pending response to treatment and follow-up evaluation. All agreed to suggested treatment plan  Review of Systems     Objective:   Physical Exam   There was tenderness to palpation of the paraspinal musculature region cervical region cervical facet region a moderate degree with moderate tenderness of his splenius capitis and occipitalis regions as well palpation of the acromioclavicular and glenohumeral joint region reproduced moderate to moderately severe discomfort on the left compared to the right. Patient the patient had difficulty performing drop test. Patient was with decreased grip strength with tenderness to palpation of the right hand especially without a definite increased warmth or erythema of the right hand noted on today's evaluation. There was tenderness over the lumbar facet and thoracic facet thoracic paraspinal musculature regions with no crepitus of the  thoracic region noted. Straight leg raise was tolerates approximately 30 without increased pain with dorsiflexion noted. DTRs appeared to be trace at the knees. Palpation of the PSIS and PII S region reproduces moderate discomfort with mild to moderate tenderness of the greater trochanteric region iliotibial band region. Abdomen was nontender with no costovertebral angle tenderness.     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 Hortencia Pilar  for evaliation of  BP diabetes mellitus rheumatoid arthritis and general medical  condition. Continue to address gout, arthritis, pancreatitis, colonoscopy, endoscopy, and lesion of fatty liver as we previously mentioned to you  F/U Dr.Soles for evaluation of diabetes mellitus   F/U surgical evaluation. Follow-up as planned with Dr. Mack Guise for evaluation of  left shoulder and for general orthopedic evaluation  F/U Dr. Vira Agar regarding GI studies . Patient with evidence of esophagitis will follow-up with Dr. Vira Agar as planned regarding status of esophagitis as well as further evaluation of pancreas liver: and general GI evaluation   Rheumatological follow-up evaluation with Dr. Teodoro Spray as discussed and continue medication as prescribed by Dr. Jefm Bryant  F/U neurological evaluation. May consider PNCV/EMG studies and other studies pending follow-up evaluations  May consider radiofrequency rhizolysis or intraspinal procedures pending response to present treatment and F/U evaluation   Patient to call  [facility] should patient have concerns prior to scheduled return appointment.

## 2015-11-30 ENCOUNTER — Other Ambulatory Visit: Payer: Self-pay | Admitting: Family Medicine

## 2015-11-30 DIAGNOSIS — Z1231 Encounter for screening mammogram for malignant neoplasm of breast: Secondary | ICD-10-CM

## 2015-12-06 ENCOUNTER — Ambulatory Visit: Payer: Medicare Other | Attending: Pain Medicine | Admitting: Pain Medicine

## 2015-12-06 ENCOUNTER — Encounter: Payer: Self-pay | Admitting: Pain Medicine

## 2015-12-06 VITALS — BP 143/75 | HR 74 | Temp 98.1°F | Resp 16 | Ht 66.0 in | Wt 235.0 lb

## 2015-12-06 DIAGNOSIS — M150 Primary generalized (osteo)arthritis: Secondary | ICD-10-CM

## 2015-12-06 DIAGNOSIS — M19019 Primary osteoarthritis, unspecified shoulder: Secondary | ICD-10-CM | POA: Insufficient documentation

## 2015-12-06 DIAGNOSIS — M461 Sacroiliitis, not elsewhere classified: Secondary | ICD-10-CM | POA: Diagnosis not present

## 2015-12-06 DIAGNOSIS — M5136 Other intervertebral disc degeneration, lumbar region: Secondary | ICD-10-CM | POA: Insufficient documentation

## 2015-12-06 DIAGNOSIS — M4806 Spinal stenosis, lumbar region: Secondary | ICD-10-CM | POA: Insufficient documentation

## 2015-12-06 DIAGNOSIS — M5137 Other intervertebral disc degeneration, lumbosacral region: Secondary | ICD-10-CM

## 2015-12-06 DIAGNOSIS — M5416 Radiculopathy, lumbar region: Secondary | ICD-10-CM | POA: Diagnosis not present

## 2015-12-06 DIAGNOSIS — M19011 Primary osteoarthritis, right shoulder: Secondary | ICD-10-CM

## 2015-12-06 DIAGNOSIS — M069 Rheumatoid arthritis, unspecified: Secondary | ICD-10-CM | POA: Diagnosis not present

## 2015-12-06 DIAGNOSIS — M159 Polyosteoarthritis, unspecified: Secondary | ICD-10-CM

## 2015-12-06 DIAGNOSIS — M19012 Primary osteoarthritis, left shoulder: Secondary | ICD-10-CM

## 2015-12-06 DIAGNOSIS — M792 Neuralgia and neuritis, unspecified: Secondary | ICD-10-CM | POA: Diagnosis not present

## 2015-12-06 DIAGNOSIS — M5030 Other cervical disc degeneration, unspecified cervical region: Secondary | ICD-10-CM | POA: Diagnosis not present

## 2015-12-06 DIAGNOSIS — E1140 Type 2 diabetes mellitus with diabetic neuropathy, unspecified: Secondary | ICD-10-CM | POA: Diagnosis not present

## 2015-12-06 DIAGNOSIS — M160 Bilateral primary osteoarthritis of hip: Secondary | ICD-10-CM

## 2015-12-06 DIAGNOSIS — M542 Cervicalgia: Secondary | ICD-10-CM | POA: Diagnosis present

## 2015-12-06 DIAGNOSIS — M5126 Other intervertebral disc displacement, lumbar region: Secondary | ICD-10-CM | POA: Insufficient documentation

## 2015-12-06 DIAGNOSIS — E1340 Other specified diabetes mellitus with diabetic neuropathy, unspecified: Secondary | ICD-10-CM

## 2015-12-06 DIAGNOSIS — M51379 Other intervertebral disc degeneration, lumbosacral region without mention of lumbar back pain or lower extremity pain: Secondary | ICD-10-CM

## 2015-12-06 MED ORDER — HYDROCODONE-ACETAMINOPHEN 5-325 MG PO TABS: ORAL_TABLET | ORAL | 0 refills | Status: DC

## 2015-12-06 NOTE — Progress Notes (Signed)
The patient is a 60 year old female who returns to pain management for further evaluation and treatment of pain involving the neck upper back mid back lower back upper and lower extremity regions. The patient states that the pain involving the region of the shoulder continues to interfere with activities of daily living to significant degree. The patient is considering surgical intervention of the shoulder and will follow-up with Dr. Mack Guise in this regard. The patient also has significant lower back lower extremity pain which appears to be fairly well-controlled at this time.. The patient denies any trauma change in events of daily living the call significant change in symptomatology. We will continue presently prescribed medications and patient will call pain management should there be significant change in condition or have other concerns regarding condition prior to scheduled return appointment. The patient will also follow up with Dr. Vira Agar and Dr. Merita Norton  as well as primary care physician Dr. Hortencia Pilar as well as discussed. All agreed to suggested treatment plan   Physical examination  The patient was with tenderness to palpation of the paraspinal musculature region of the cervical region of moderate degree with palpation of the splenius capitis and occipitalis regions reproducing pain of moderate discomfort. The patient was with tenderness of the acromioclavicular and glenohumeral joint regions of moderately severe degree with limited range of motion of the shoulder. The patient was with moderate difficulty attempting to perform drop test. The patient appeared to be with unremarkable Spurling's maneuver. Tinel and Phalen's maneuver were without increase of pain of significant degree. Palpation over the thoracic region was with tenderness to palpation without crepitus of the thoracic region noted. Palpation over the lumbar region was attends to palpation with lateral bending rotation  extension and palpation of the lumbar facets reproducing moderate discomfort. There was moderate tenderness of the PSIS and PII S region as well as the gluteal and piriformis musculature regions. Straight leg raise was limited to approximately 20 without increased pain with dorsiflexion noted. EHL strength appeared to be slightly decreased with no sensory deficit of dermatomal dystrophy detected. There was negative clonus negative Homans. 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 Hortencia Pilar  for evaliation of  BP diabetes mellitus GI condition rheumatoid arthritis and general medical  condition. Continue to address with your PCP the conditions of gout, arthritis, pancreatitis, colonoscopy, endoscopy, and lesion of fatty liver as we previously mentioned to you  F/U Dr.Soles for evaluation of diabetes mellitus   F/U surgical evaluation. Follow-up as planned with Dr. Mack Guise for evaluation of shoulder and for general orthopedic evaluation  F/U Dr. Vira Agar regarding GI studies . Patient with evidence of esophagitis will follow-up with Dr. Vira Agar as planned regarding status of esophagitis as well as further evaluation of pancreas liver: and general GI evaluation   Rheumatological follow-up evaluation with Dr. Dayton Martes as discussed and continue medication as prescribed by Dr. Jefm Bryant  F/U neurological evaluation. May consider PNCV/EMG studies and other studies pending  follow-up evaluations  May consider radiofrequency rhizolysis or intraspinal  procedures pending response to present treatment and F/U evaluation   Patient to call [facility] should patient have concerns prior to scheduled return appointment.

## 2015-12-06 NOTE — Patient Instructions (Addendum)
PLAN   Continue present medications Neurontin and hydrocodone acetaminophen  F/U PCP Dr. Hortencia Pilar  for evaliation of  BP diabetes mellitus rheumatoid arthritis and general medical  condition. Continue to address gout, arthritis, pancreatitis, colonoscopy, endoscopy, and lesion of fatty liver as we previously mentioned to you  F/U Dr.Soles for evaluation of diabetes mellitus   F/U surgical evaluation. Follow-up as planned with Dr. Mack Guise for evaluation of shoulder and for general orthopedic evaluation  F/U Dr. Vira Agar regarding GI studies . Patient with evidence of esophagitis will follow-up with Dr. Vira Agar as planned regarding status of esophagitis as well as further evaluation of pancreas liver: and general GI evaluation   Rheumatological follow-up evaluation with Dr. Dayton Martes as discussed and continue medication as prescribed by Dr. Jefm Bryant  F/U neurological evaluation. May consider PNCV/EMG studies and other studies pending follow-up evaluations  May consider radiofrequency rhizolysis or intraspinal procedures pending response to present treatment and F/U evaluation   Patient to call [facility] should patient have concerns prior to scheduled return appointment.

## 2015-12-14 DIAGNOSIS — Z794 Long term (current) use of insulin: Secondary | ICD-10-CM | POA: Diagnosis not present

## 2015-12-14 DIAGNOSIS — E1165 Type 2 diabetes mellitus with hyperglycemia: Secondary | ICD-10-CM | POA: Diagnosis not present

## 2015-12-14 DIAGNOSIS — E1129 Type 2 diabetes mellitus with other diabetic kidney complication: Secondary | ICD-10-CM | POA: Diagnosis not present

## 2015-12-14 DIAGNOSIS — R809 Proteinuria, unspecified: Secondary | ICD-10-CM | POA: Diagnosis not present

## 2015-12-14 DIAGNOSIS — E1142 Type 2 diabetes mellitus with diabetic polyneuropathy: Secondary | ICD-10-CM | POA: Diagnosis not present

## 2015-12-15 ENCOUNTER — Ambulatory Visit
Admission: RE | Admit: 2015-12-15 | Discharge: 2015-12-15 | Disposition: A | Discharge status: Home / Self Care | Payer: Medicare Other | Source: Ambulatory Visit | Attending: Family Medicine | Admitting: Family Medicine

## 2015-12-15 ENCOUNTER — Other Ambulatory Visit: Payer: Self-pay | Admitting: Family Medicine

## 2015-12-15 DIAGNOSIS — Z1231 Encounter for screening mammogram for malignant neoplasm of breast: Secondary | ICD-10-CM | POA: Diagnosis not present

## 2015-12-23 DIAGNOSIS — Z794 Long term (current) use of insulin: Secondary | ICD-10-CM | POA: Diagnosis not present

## 2015-12-23 DIAGNOSIS — E1142 Type 2 diabetes mellitus with diabetic polyneuropathy: Secondary | ICD-10-CM | POA: Diagnosis not present

## 2015-12-23 DIAGNOSIS — E1129 Type 2 diabetes mellitus with other diabetic kidney complication: Secondary | ICD-10-CM | POA: Diagnosis not present

## 2015-12-23 DIAGNOSIS — E1165 Type 2 diabetes mellitus with hyperglycemia: Secondary | ICD-10-CM | POA: Diagnosis not present

## 2015-12-23 DIAGNOSIS — E559 Vitamin D deficiency, unspecified: Secondary | ICD-10-CM | POA: Diagnosis not present

## 2015-12-23 DIAGNOSIS — R809 Proteinuria, unspecified: Secondary | ICD-10-CM | POA: Diagnosis not present

## 2016-01-03 ENCOUNTER — Encounter: Payer: Self-pay | Admitting: Pain Medicine

## 2016-01-03 ENCOUNTER — Ambulatory Visit: Payer: Medicare Other | Attending: Pain Medicine | Admitting: Pain Medicine

## 2016-01-03 VITALS — BP 157/96 | HR 74 | Temp 96.5°F | Resp 15 | Ht 66.0 in | Wt 235.0 lb

## 2016-01-03 DIAGNOSIS — M150 Primary generalized (osteo)arthritis: Secondary | ICD-10-CM

## 2016-01-03 DIAGNOSIS — E1340 Other specified diabetes mellitus with diabetic neuropathy, unspecified: Secondary | ICD-10-CM

## 2016-01-03 DIAGNOSIS — M5126 Other intervertebral disc displacement, lumbar region: Secondary | ICD-10-CM | POA: Insufficient documentation

## 2016-01-03 DIAGNOSIS — M792 Neuralgia and neuritis, unspecified: Secondary | ICD-10-CM | POA: Diagnosis not present

## 2016-01-03 DIAGNOSIS — M19011 Primary osteoarthritis, right shoulder: Secondary | ICD-10-CM | POA: Diagnosis not present

## 2016-01-03 DIAGNOSIS — E1140 Type 2 diabetes mellitus with diabetic neuropathy, unspecified: Secondary | ICD-10-CM | POA: Diagnosis not present

## 2016-01-03 DIAGNOSIS — M5030 Other cervical disc degeneration, unspecified cervical region: Secondary | ICD-10-CM | POA: Diagnosis not present

## 2016-01-03 DIAGNOSIS — M25512 Pain in left shoulder: Secondary | ICD-10-CM | POA: Diagnosis present

## 2016-01-03 DIAGNOSIS — M5136 Other intervertebral disc degeneration, lumbar region: Secondary | ICD-10-CM | POA: Diagnosis not present

## 2016-01-03 DIAGNOSIS — M069 Rheumatoid arthritis, unspecified: Secondary | ICD-10-CM | POA: Insufficient documentation

## 2016-01-03 DIAGNOSIS — M5137 Other intervertebral disc degeneration, lumbosacral region: Secondary | ICD-10-CM

## 2016-01-03 DIAGNOSIS — M19012 Primary osteoarthritis, left shoulder: Secondary | ICD-10-CM | POA: Insufficient documentation

## 2016-01-03 DIAGNOSIS — M5416 Radiculopathy, lumbar region: Secondary | ICD-10-CM | POA: Diagnosis not present

## 2016-01-03 DIAGNOSIS — M159 Polyosteoarthritis, unspecified: Secondary | ICD-10-CM

## 2016-01-03 DIAGNOSIS — M461 Sacroiliitis, not elsewhere classified: Secondary | ICD-10-CM | POA: Diagnosis not present

## 2016-01-03 DIAGNOSIS — M4806 Spinal stenosis, lumbar region: Secondary | ICD-10-CM | POA: Diagnosis not present

## 2016-01-03 DIAGNOSIS — M542 Cervicalgia: Secondary | ICD-10-CM | POA: Diagnosis present

## 2016-01-03 DIAGNOSIS — M25511 Pain in right shoulder: Secondary | ICD-10-CM | POA: Diagnosis present

## 2016-01-03 DIAGNOSIS — M51379 Other intervertebral disc degeneration, lumbosacral region without mention of lumbar back pain or lower extremity pain: Secondary | ICD-10-CM

## 2016-01-03 MED ORDER — GABAPENTIN 300 MG PO CAPS: ORAL_CAPSULE | ORAL | 2 refills | Status: DC

## 2016-01-03 MED ORDER — HYDROCODONE-ACETAMINOPHEN 5-325 MG PO TABS: ORAL_TABLET | ORAL | 0 refills | Status: DC

## 2016-01-03 NOTE — Progress Notes (Signed)
Safety precautions to be maintained throughout the outpatient stay will include: orient to surroundings, keep bed in low position, maintain call bell within reach at all times, provide assistance with transfer out of bed and ambulation.  

## 2016-01-03 NOTE — Patient Instructions (Addendum)
PLAN   Continue present medications Neurontin and hydrocodone acetaminophen  Shoulder injection to be performed at time of return appointment  F/U PCP Dr. Hortencia Pilar  for evaliation of  BP diabetes mellitus rheumatoid arthritis and general medical  condition. Continue to address gout, arthritis, pancreatitis, colonoscopy, endoscopy, and lesion of fatty liver as we previously mentioned to you  F/U Dr.Soles for evaluation of diabetes mellitus   F/U surgical evaluation. Follow-up as planned with Dr. Mack Guise for evaluation of shoulder and for general orthopedic evaluation  F/U Dr. Vira Agar regarding GI studies . Patient with evidence of esophagitis will  continueto follow-up with Dr. Vira Agar regarding status of esophagitis as well as further evaluation of pancreas liver: and general GI evaluation   Rheumatological follow-up evaluation with Dr. Cindie Laroche Leeanne Mannan as discussed and continue medication as prescribed by Dr. Jefm Bryant  F/U neurological evaluation. May consider PNCV/EMG studies and other studies pending follow-up evaluations  May consider radiofrequency rhizolysis or intraspinal procedures pending response to present treatment and F/U evaluation   Patient to call [facility] should patient have concerns prior to scheduled return appointment. Trigger Point Injection Trigger points are areas where you have muscle pain. A trigger point injection is a shot given in the trigger point to relieve that pain. A trigger point might feel like a knot in your muscle. It hurts to press on a trigger point. Sometimes the pain spreads out (radiates) to other parts of the body. For example, pressing on a trigger point in your shoulder might cause pain in your arm or neck. You might have one trigger point. Or, you might have more than one. People often have trigger points in their upper back and lower back. They also occur often in the neck and shoulders. Pain from a trigger point lasts for a  long time. It can make it hard to keep moving. You might not be able to do the exercise or physical therapy that could help you deal with the pain. A trigger point injection may help. It does not work for everyone. But, it may relieve your pain for a few days or a few months. A trigger point injection does not cure long-lasting (chronic) pain. LET YOUR CAREGIVER KNOW ABOUT:  Any allergies (especially to latex, lidocaine, or steroids).  Blood-thinning medicines that you take. These drugs can lead to bleeding or bruising after an injection. They include:  Aspirin.  Ibuprofen.  Clopidogrel.  Warfarin.  Other medicines you take. This includes all vitamins, herbs, eyedrops, over-the-counter medicines, and creams.  Use of steroids.  Recent infections.  Past problems with numbing medicines.  Bleeding problems.  Surgeries you have had.  Other health problems. RISKS AND COMPLICATIONS A trigger point injection is a safe treatment. However, problems may develop, such as:  Minor side effects usually go away in 1 to 2 days. These may include:  Soreness.  Bruising.  Stiffness.  More serious problems are rare. But, they may include:  Bleeding under the skin (hematoma).  Skin infection.  Breaking off of the needle under your skin.  Lung puncture.  The trigger point injection may not work for you. BEFORE THE PROCEDURE You may need to stop taking any medicine that thins your blood. This is to prevent bleeding and bruising. Usually these medicines are stopped several days before the injection. No other preparation is needed. PROCEDURE  A trigger point injection can be given in your caregiver's office or in a clinic. Each injection takes 2 minutes or less.  Your caregiver will feel for trigger points. The caregiver may use a marker to circle the area for the injection.  The skin over the trigger point will be washed with a germ-killing (antiseptic) solution.  The caregiver  pinches the spot for the injection.  Then, a very thin needle is used for the shot. You may feel pain or a twitching feeling when the needle enters the trigger point.  A numbing solution may be injected into the trigger point. Sometimes a drug to keep down swelling, redness, and warmth (inflammation) is also injected.  Your caregiver moves the needle around the trigger zone until the tightness and twitching goes away.  After the injection, your caregiver may put gentle pressure over the injection site.  Then it is covered with a bandage. AFTER THE PROCEDURE  You can go right home after the injection.  The bandage can be taken off after a few hours.  You may feel sore and stiff for 1 to 2 days.  Go back to your regular activities slowly. Your caregiver may ask you to stretch your muscles. Do not do anything that takes extra energy for a few days.  Follow your caregiver's instructions to manage and treat other pain.   This information is not intended to replace advice given to you by your health care provider. Make sure you discuss any questions you have with your health care provider.   Document Released: 04/20/2011 Document Revised: 08/26/2012 Document Reviewed: 04/20/2011 Elsevier Interactive Patient Education 2016 [place]  What are the risk, side effects and possible complications? Generally speaking, most procedures are safe.  However, with any procedure there are risks, side effects, and the possibility of complications.  The risks and complications are dependent upon the sites that are lesioned, or the type of nerve block to be performed.  The closer the procedure is to the spine, the more serious the risks are.  Great care is taken when placing the radio frequency needles, block needles or lesioning probes, but sometimes complications can occur. 1. Infection: Any time there is an injection through the skin, there is a risk of infection.   This is why sterile conditions are used for these blocks.  There are four possible types of infection. 1. Localized skin infection. 2. Central Nervous System Infection-This can be in the form of Meningitis, which can be deadly. 3. Epidural Infections-This can be in the form of an epidural abscess, which can cause pressure inside of the spine, causing compression of the spinal cord with subsequent paralysis. This would require an emergency surgery to decompress, and there are no guarantees that the patient would recover from the paralysis. 4. Discitis-This is an infection of the intervertebral discs.  It occurs in about 1% of discography procedures.  It is difficult to treat and it may lead to surgery.        2. Pain: the needles have to go through skin and soft tissues, will cause soreness.       3. Damage to internal structures:  The nerves to be lesioned may be near blood vessels or    other nerves which can be potentially damaged.       4. Bleeding: Bleeding is more common if the patient is taking blood thinners such as  aspirin, Coumadin, Ticiid, Plavix, etc., or if he/she have some genetic predisposition  such as hemophilia. Bleeding into the spinal canal can cause compression of the spinal  cord with subsequent paralysis.  This  would require an emergency surgery to  decompress and there are no guarantees that the patient would recover from the  paralysis.       5. Pneumothorax:  Puncturing of a lung is a possibility, every time a needle is introduced in  the area of the chest or upper back.  Pneumothorax refers to free air around the  collapsed lung(s), inside of the thoracic cavity (chest cavity).  Another two possible  complications related to a similar event would include: Hemothorax and Chylothorax.   These are variations of the Pneumothorax, where instead of air around the collapsed  lung(s), you may have blood or chyle, respectively.       6. Spinal headaches: They may occur with any  procedures in the area of the spine.       7. Persistent CSF (Cerebro-Spinal Fluid) leakage: This is a rare problem, but may occur  with prolonged intrathecal or epidural catheters either due to the formation of a fistulous  track or a dural tear.       8. Nerve damage: By working so close to the spinal cord, there is always a possibility of  nerve damage, which could be as serious as a permanent spinal cord injury with  paralysis.       9. Death:  Although rare, severe deadly allergic reactions known as "Anaphylactic  reaction" can occur to any of the medications used.      10. Worsening of the symptoms:  We can always make thing worse.  What are the chances of something like this happening? Chances of any of this occuring are extremely low.  By statistics, you have more of a chance of getting killed in a motor vehicle accident: while driving to the hospital than any of the above occurring .  Nevertheless, you should be aware that they are possibilities.  In general, it is similar to taking a shower.  Everybody knows that you can slip, hit your head and get killed.  Does that mean that you should not shower again?  Nevertheless always keep in mind that statistics do not mean anything if you happen to be on the wrong side of them.  Even if a procedure has a 1 (one) in a 1,000,000 (million) chance of going wrong, it you happen to be that one..Also, keep in mind that by statistics, you have more of a chance of having something go wrong when taking medications.  Who should not have this procedure? If you are on a blood thinning medication (e.g. Coumadin, Plavix, see list of "Blood Thinners"), or if you have an active infection going on, you should not have the procedure.  If you are taking any blood thinners, please inform your physician.  How should I prepare for this procedure?  Do not eat or drink anything at least six hours prior to the procedure.  Bring a driver with you .  It cannot be a  taxi.  Come accompanied by an adult that can drive you back, and that is strong enough to help you if your legs get weak or numb from the local anesthetic.  Take all of your medicines the morning of the procedure with just enough water to swallow them.  If you have diabetes, make sure that you are scheduled to have your procedure done first thing in the morning, whenever possible.  If you have diabetes, take only half of your insulin dose and notify our nurse that you have done so as soon as  you arrive at the clinic.  If you are diabetic, but only take blood sugar pills (oral hypoglycemic), then do not take them on the morning of your procedure.  You may take them after you have had the procedure.  Do not take aspirin or any aspirin-containing medications, at least eleven (11) days prior to the procedure.  They may prolong bleeding.  Wear loose fitting clothing that may be easy to take off and that you would not mind if it got stained with Betadine or blood.  Do not wear any jewelry or perfume  Remove any nail coloring.  It will interfere with some of our monitoring equipment.  NOTE: Remember that this is not meant to be interpreted as a complete list of all possible complications.  Unforeseen problems may occur.  BLOOD THINNERS The following drugs contain aspirin or other products, which can cause increased bleeding during surgery and should not be taken for 2 weeks prior to and 1 week after surgery.  If you should need take something for relief of minor pain, you may take acetaminophen which is found in Tylenol,m Datril, Anacin-3 and Panadol. It is not blood thinner. The products listed below are.  Do not take any of the products listed below in addition to any listed on your instruction sheet.  A.P.C or A.P.C with Codeine Codeine Phosphate Capsules #3 Ibuprofen Ridaura  ABC compound Congesprin Imuran rimadil  Advil Cope Indocin Robaxisal  Alka-Seltzer Effervescent Pain Reliever and  Antacid Coricidin or Coricidin-D  Indomethacin Rufen  Alka-Seltzer plus Cold Medicine Cosprin Ketoprofen S-A-C Tablets  Anacin Analgesic Tablets or Capsules Coumadin Korlgesic Salflex  Anacin Extra Strength Analgesic tablets or capsules CP-2 Tablets Lanoril Salicylate  Anaprox Cuprimine Capsules Levenox Salocol  Anexsia-D Dalteparin Magan Salsalate  Anodynos Darvon compound Magnesium Salicylate Sine-off  Ansaid Dasin Capsules Magsal Sodium Salicylate  Anturane Depen Capsules Marnal Soma  APF Arthritis pain formula Dewitt's Pills Measurin Stanback  Argesic Dia-Gesic Meclofenamic Sulfinpyrazone  Arthritis Bayer Timed Release Aspirin Diclofenac Meclomen Sulindac  Arthritis pain formula Anacin Dicumarol Medipren Supac  Analgesic (Safety coated) Arthralgen Diffunasal Mefanamic Suprofen  Arthritis Strength Bufferin Dihydrocodeine Mepro Compound Suprol  Arthropan liquid Dopirydamole Methcarbomol with Aspirin Synalgos  ASA tablets/Enseals Disalcid Micrainin Tagament  Ascriptin Doan's Midol Talwin  Ascriptin A/D Dolene Mobidin Tanderil  Ascriptin Extra Strength Dolobid Moblgesic Ticlid  Ascriptin with Codeine Doloprin or Doloprin with Codeine Momentum Tolectin  Asperbuf Duoprin Mono-gesic Trendar  Aspergum Duradyne Motrin or Motrin IB Triminicin  Aspirin plain, buffered or enteric coated Durasal Myochrisine Trigesic  Aspirin Suppositories Easprin Nalfon Trillsate  Aspirin with Codeine Ecotrin Regular or Extra Strength Naprosyn Uracel  Atromid-S Efficin Naproxen Ursinus  Auranofin Capsules Elmiron Neocylate Vanquish  Axotal Emagrin Norgesic Verin  Azathioprine Empirin or Empirin with Codeine Normiflo Vitamin E  Azolid Emprazil Nuprin Voltaren  Bayer Aspirin plain, buffered or children's or timed BC Tablets or powders Encaprin Orgaran Warfarin Sodium  Buff-a-Comp Enoxaparin Orudis Zorpin  Buff-a-Comp with Codeine Equegesic Os-Cal-Gesic   Buffaprin Excedrin plain, buffered or Extra Strength  Oxalid   Bufferin Arthritis Strength Feldene Oxphenbutazone   Bufferin plain or Extra Strength Feldene Capsules Oxycodone with Aspirin   Bufferin with Codeine Fenoprofen Fenoprofen Pabalate or Pabalate-SF   Buffets II Flogesic Panagesic   Buffinol plain or Extra Strength Florinal or Florinal with Codeine Panwarfarin   Buf-Tabs Flurbiprofen Penicillamine   Butalbital Compound Four-way cold tablets Penicillin   Butazolidin Fragmin Pepto-Bismol   Carbenicillin Geminisyn Percodan   Carna Arthritis Reliever Geopen Persantine  Carprofen Gold's salt Persistin   Chloramphenicol Goody's Phenylbutazone   Chloromycetin Haltrain Piroxlcam   Clmetidine heparin Plaquenil   Cllnoril Hyco-pap Ponstel   Clofibrate Hydroxy chloroquine Propoxyphen         Before stopping any of these medications, be sure to consult the physician who ordered them.  Some, such as Coumadin (Warfarin) are ordered to prevent or treat serious conditions such as "deep thrombosis", "pumonary embolisms", and other heart problems.  The amount of time that you may need off of the medication may also vary with the medication and the reason for which you were taking it.  If you are taking any of these medications, please make sure you notify your pain physician before you undergo any procedures.

## 2016-01-03 NOTE — Progress Notes (Signed)
The patient is a 60 year old female who returns to pain management for further evaluation and treatment of pain involving the neck shoulders and have back upper and lower extremity regions. The patient is to undergo surgical intervention of the shoulder. The patient has been with pain involving the region of the left shoulder of rather severe degree. The pain is interfering with activities of daily living to severe degree. The patient is without plans to proceed with surgery of the shoulder at this time and wishes to proceed with injection of shoulder at time of return appointment. We have discussed patient's diabetes mellitus and general medical condition as well as patient's arthritis and we will proceed with interventional treatment consisting of injection of the shoulder at time of return appointment. The patient will continue Neurontin and hydrocodone acetaminophen as prescribed at this time as well. All agreed to suggested treatment plan. The patient denies any trauma change in events of daily living the call significant change in symptomatology.    Physical examination  There was tenderness to palpation of paraspinal musculature region cervical region cervical facet region a mild to moderate degree with mild to moderate tenderness of the splenius capitis and occipitalis region. Palpation of the acromioclavicular and glenohumeral joint regions reproduce severe pain with limited range of motion of the shoulder and with inability to perform drop test without significant difficulty. The patient appeared to be with slightly decreased grip strength with Tinel and Phalen's maneuver reproducing pain of mild degree. Palpation over the thoracic region was with tenderness to palpation with crepitus of the thoracic region noted. Palpation of the lumbar region was with tenderness to palpation of mild to moderate degree with lateral bending rotation extension and palpation over the lumbar facets reproducing  mild to moderate discomfort with mild to moderate tenderness along the PSIS and PII S region as well as the gluteal and piriformis muscles regions. Straight leg raise was tolerates approximately 30 without increased pain with dorsiflexion noted. There was negative clonus negative Homans. Knees were with crepitus of the knees with negative anterior and posterior drawer signs without ballottement of the patella. No sensory deficit of dermatomal distribution detected. There was negative clonus negative Homans. Abdomen nontender with no costovertebral angle tenderness noted.    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 Hortencia Pilar  for evaliation of  BP diabetes mellitus rheumatoid arthritis and general medical  condition. Continue to address gout, arthritis, pancreatitis, colonoscopy, endoscopy, and lesion of fatty liver as we previously mentioned to you  F/U Dr.Soles for evaluation of diabetes mellitus   F/U surgical evaluation. Follow-up as planned with Dr. Mack Guise for evaluation of shoulder and for general orthopedic evaluation  F/U Dr. Vira Agar regarding GI studies . Patient with evidence of esophagitis will  continueto follow-up with Dr. Vira Agar regarding status of esophagitis as well as further evaluation of pancreas liver: and general GI evaluation   Rheumatological follow-up evaluation with Dr. Cindie Laroche Leeanne Mannan as discussed and continue medication as prescribed by Dr.  Jefm Bryant  F/U neurological evaluation. May consider PNCV/EMG  studies and other studies pending follow-up evaluations  May consider radiofrequency rhizolysis or intraspinal procedures pending response to present treatment and F/U evaluation   Patient to call [facility] Management Center should patient have concerns prior to scheduled return appointment.

## 2016-01-10 DIAGNOSIS — M0579 Rheumatoid arthritis with rheumatoid factor of multiple sites without organ or systems involvement: Secondary | ICD-10-CM | POA: Diagnosis not present

## 2016-01-10 DIAGNOSIS — M25512 Pain in left shoulder: Secondary | ICD-10-CM | POA: Diagnosis not present

## 2016-01-10 DIAGNOSIS — M65322 Trigger finger, left index finger: Secondary | ICD-10-CM | POA: Diagnosis not present

## 2016-01-10 DIAGNOSIS — Z79899 Other long term (current) drug therapy: Secondary | ICD-10-CM | POA: Diagnosis not present

## 2016-01-10 DIAGNOSIS — G8929 Other chronic pain: Secondary | ICD-10-CM | POA: Diagnosis not present

## 2016-01-18 ENCOUNTER — Telehealth: Payer: Self-pay | Admitting: *Deleted

## 2016-01-30 ENCOUNTER — Other Ambulatory Visit: Payer: Self-pay | Admitting: Pain Medicine

## 2016-01-31 ENCOUNTER — Other Ambulatory Visit: Payer: Self-pay | Admitting: Pain Medicine

## 2016-01-31 ENCOUNTER — Ambulatory Visit: Payer: Medicare Other | Admitting: Pain Medicine

## 2016-02-01 DIAGNOSIS — M0579 Rheumatoid arthritis with rheumatoid factor of multiple sites without organ or systems involvement: Secondary | ICD-10-CM | POA: Diagnosis not present

## 2016-02-01 DIAGNOSIS — Z79899 Other long term (current) drug therapy: Secondary | ICD-10-CM | POA: Diagnosis not present

## 2016-02-07 DIAGNOSIS — M65322 Trigger finger, left index finger: Secondary | ICD-10-CM | POA: Diagnosis not present

## 2016-02-07 DIAGNOSIS — M0579 Rheumatoid arthritis with rheumatoid factor of multiple sites without organ or systems involvement: Secondary | ICD-10-CM | POA: Diagnosis not present

## 2016-02-07 DIAGNOSIS — M79641 Pain in right hand: Secondary | ICD-10-CM | POA: Diagnosis not present

## 2016-02-28 DIAGNOSIS — M0579 Rheumatoid arthritis with rheumatoid factor of multiple sites without organ or systems involvement: Secondary | ICD-10-CM | POA: Diagnosis not present

## 2016-03-02 DIAGNOSIS — I10 Essential (primary) hypertension: Secondary | ICD-10-CM | POA: Diagnosis not present

## 2016-03-02 DIAGNOSIS — E1165 Type 2 diabetes mellitus with hyperglycemia: Secondary | ICD-10-CM | POA: Diagnosis not present

## 2016-03-02 DIAGNOSIS — Z794 Long term (current) use of insulin: Secondary | ICD-10-CM | POA: Diagnosis not present

## 2016-03-02 DIAGNOSIS — E1129 Type 2 diabetes mellitus with other diabetic kidney complication: Secondary | ICD-10-CM | POA: Diagnosis not present

## 2016-03-02 DIAGNOSIS — R809 Proteinuria, unspecified: Secondary | ICD-10-CM | POA: Diagnosis not present

## 2016-03-02 DIAGNOSIS — Z23 Encounter for immunization: Secondary | ICD-10-CM | POA: Diagnosis not present

## 2016-03-02 DIAGNOSIS — M0609 Rheumatoid arthritis without rheumatoid factor, multiple sites: Secondary | ICD-10-CM | POA: Diagnosis not present

## 2016-03-07 DIAGNOSIS — M792 Neuralgia and neuritis, unspecified: Secondary | ICD-10-CM | POA: Diagnosis not present

## 2016-03-07 DIAGNOSIS — E1340 Other specified diabetes mellitus with diabetic neuropathy, unspecified: Secondary | ICD-10-CM | POA: Diagnosis not present

## 2016-03-07 DIAGNOSIS — M461 Sacroiliitis, not elsewhere classified: Secondary | ICD-10-CM | POA: Diagnosis not present

## 2016-03-07 DIAGNOSIS — M5416 Radiculopathy, lumbar region: Secondary | ICD-10-CM | POA: Diagnosis not present

## 2016-03-08 DIAGNOSIS — M65332 Trigger finger, left middle finger: Secondary | ICD-10-CM | POA: Diagnosis not present

## 2016-03-08 DIAGNOSIS — R110 Nausea: Secondary | ICD-10-CM | POA: Diagnosis not present

## 2016-03-08 DIAGNOSIS — M0579 Rheumatoid arthritis with rheumatoid factor of multiple sites without organ or systems involvement: Secondary | ICD-10-CM | POA: Diagnosis not present

## 2016-03-14 ENCOUNTER — Emergency Department
Admission: EM | Admit: 2016-03-14 | Discharge: 2016-03-14 | Disposition: A | Discharge status: Home / Self Care | Payer: Medicare Other | Attending: Emergency Medicine | Admitting: Emergency Medicine

## 2016-03-14 ENCOUNTER — Emergency Department: Payer: Medicare Other

## 2016-03-14 DIAGNOSIS — I10 Essential (primary) hypertension: Secondary | ICD-10-CM | POA: Insufficient documentation

## 2016-03-14 DIAGNOSIS — Z7982 Long term (current) use of aspirin: Secondary | ICD-10-CM | POA: Insufficient documentation

## 2016-03-14 DIAGNOSIS — M542 Cervicalgia: Secondary | ICD-10-CM | POA: Diagnosis not present

## 2016-03-14 DIAGNOSIS — H538 Other visual disturbances: Secondary | ICD-10-CM | POA: Insufficient documentation

## 2016-03-14 DIAGNOSIS — Z79899 Other long term (current) drug therapy: Secondary | ICD-10-CM | POA: Diagnosis not present

## 2016-03-14 DIAGNOSIS — Z87891 Personal history of nicotine dependence: Secondary | ICD-10-CM | POA: Diagnosis not present

## 2016-03-14 DIAGNOSIS — E119 Type 2 diabetes mellitus without complications: Secondary | ICD-10-CM | POA: Diagnosis not present

## 2016-03-14 DIAGNOSIS — Z794 Long term (current) use of insulin: Secondary | ICD-10-CM | POA: Insufficient documentation

## 2016-03-14 DIAGNOSIS — R51 Headache: Secondary | ICD-10-CM | POA: Diagnosis not present

## 2016-03-14 LAB — COMPREHENSIVE METABOLIC PANEL
ALBUMIN: 4 g/dL (ref 3.5–5.0)
ALT: 14 U/L (ref 14–54)
ANION GAP: 7 (ref 5–15)
AST: 17 U/L (ref 15–41)
Alkaline Phosphatase: 82 U/L (ref 38–126)
BILIRUBIN TOTAL: 0.1 mg/dL — AB (ref 0.3–1.2)
BUN: 13 mg/dL (ref 6–20)
CHLORIDE: 105 mmol/L (ref 101–111)
CO2: 27 mmol/L (ref 22–32)
Calcium: 9.4 mg/dL (ref 8.9–10.3)
Creatinine, Ser: 0.92 mg/dL (ref 0.44–1.00)
GFR calc Af Amer: >60 mL/min (ref >60)
GFR calc non Af Amer: >60 mL/min (ref >60)
GLUCOSE: 176 mg/dL — AB (ref 65–99)
POTASSIUM: 3.5 mmol/L (ref 3.5–5.1)
Sodium: 139 mmol/L (ref 135–145)
TOTAL PROTEIN: 7.5 g/dL (ref 6.5–8.1)

## 2016-03-14 LAB — URINALYSIS COMPLETE WITH MICROSCOPIC (ARMC ONLY)
BILIRUBIN URINE: NEGATIVE
Bacteria, UA: NONE SEEN
GLUCOSE, UA: NEGATIVE mg/dL
HGB URINE DIPSTICK: NEGATIVE
Ketones, ur: NEGATIVE mg/dL
LEUKOCYTES UA: NEGATIVE
Nitrite: NEGATIVE
PH: 5 (ref 5.0–8.0)
Protein, ur: NEGATIVE mg/dL
SPECIFIC GRAVITY, URINE: 1.014 (ref 1.005–1.030)

## 2016-03-14 LAB — SEDIMENTATION RATE: Sed Rate: 45 mm/hr — ABNORMAL HIGH (ref 0–30)

## 2016-03-14 LAB — CBC WITH DIFFERENTIAL/PLATELET
Basophils Absolute: 0.1 10*3/uL (ref 0–0.1)
Basophils Relative: 1 %
EOS ABS: 0.1 10*3/uL (ref 0–0.7)
EOS PCT: 1 %
HCT: 34.4 % — ABNORMAL LOW (ref 35.0–47.0)
Hemoglobin: 11.8 g/dL — ABNORMAL LOW (ref 12.0–16.0)
LYMPHS ABS: 3.6 10*3/uL (ref 1.0–3.6)
Lymphocytes Relative: 35 %
MCH: 27.7 pg (ref 26.0–34.0)
MCHC: 34.3 g/dL (ref 32.0–36.0)
MCV: 80.7 fL (ref 80.0–100.0)
MONO ABS: 0.7 10*3/uL (ref 0.2–0.9)
MONOS PCT: 7 %
Neutro Abs: 5.7 10*3/uL (ref 1.4–6.5)
Neutrophils Relative %: 56 %
PLATELETS: 378 10*3/uL (ref 150–440)
RBC: 4.27 MIL/uL (ref 3.80–5.20)
RDW: 16.1 % — AB (ref 11.5–14.5)
WBC: 10.2 10*3/uL (ref 3.6–11.0)

## 2016-03-14 LAB — C-REACTIVE PROTEIN: CRP: 1.1 mg/dL — ABNORMAL HIGH (ref <1.0)

## 2016-03-14 LAB — GLUCOSE, CAPILLARY: Glucose-Capillary: 178 mg/dL — ABNORMAL HIGH (ref 65–99)

## 2016-03-14 MED ORDER — HYDROCHLOROTHIAZIDE 25 MG PO TABS
25.0000 mg | ORAL_TABLET | Freq: Every day | ORAL | Status: DC
  Administered 2016-03-14: 25 mg via ORAL
  Filled 2016-03-14: qty 1

## 2016-03-14 MED ORDER — TELMISARTAN-HCTZ 80-25 MG PO TABS: 1.0000 | ORAL_TABLET | Freq: Every day | ORAL | Status: DC

## 2016-03-14 MED ORDER — IRBESARTAN 150 MG PO TABS
300.0000 mg | ORAL_TABLET | Freq: Every day | ORAL | Status: DC
  Administered 2016-03-14: 300 mg via ORAL
  Filled 2016-03-14: qty 2

## 2016-03-14 NOTE — ED Notes (Signed)
Patient transported to CT 

## 2016-03-14 NOTE — Discharge Instructions (Signed)
You have been seen in the Emergency Department (ED) for a headache.   Called Dr. Scharlene Gloss clinic today to set up a follow-up visit as soon as possible and inform him in the change in symptoms.  Call your doctor or return to the ED if you have a worsening headache, sudden and severe headache, confusion, slurred speech, facial droop, weakness or numbness in any arm or leg, extreme fatigue, vision problems, or other symptoms that concern you.

## 2016-03-14 NOTE — ED Provider Notes (Signed)
[facility] Emergency Department Provider Note   ____________________________________________   First MD Initiated Contact with Patient 03/14/16 0701     (approximate)  I have reviewed the triage vital signs and the nursing notes.   HISTORY  Chief Complaint Blurred Vision and Headache    HPI Teren Franckowiak Dalziel is a 60 y.o. female reports that about one week ago she began noticing that her vision seemed like he had a film over it in both eyes and slightly fuzzy. This has improved, but she also has been experiencing an aching pain that comes from the back of the right side of the scalp and radiates forward out over her forehead. She denies eye pain, she has not lost any vision  She is alsoa small "spot" on her right neck that is sore over the last week as well. No fevers chills. No headache or neck stiffness except for her chronic headaches associated with 2 previous cervical surgeries.  Her blood sugars have been ranging from about 80-160s in the past  week  She has also had achy hands and was placed on methotrexate roughly 2 weeks ago by rheumatology. She reports the dose was increased about one week ago. This has improved the achiness and swelling in her ankles and hands.  No chest pain or trouble breathing  No numbness or weakness in the arms face or legs. No trouble walking. Denies any trouble speaking  Past Medical History:  Diagnosis Date  . DDD (degenerative disc disease), lumbar   . Depression   . Diabetes mellitus without complication ([place])   . Diverticulitis   . Fatty liver   . Fibroid tumor   . GERD (gastroesophageal reflux disease)   . Gout   . H pylori ulcer   . Hemorrhoids   . Hypercholesteremia   . Hypertension   . IBS (irritable bowel syndrome)   . Spinal stenosis     Patient Active Problem List   Diagnosis Date Noted  . Elevated uric acid in blood 12/02/2014  . B12 deficiency 11/05/2014  . Benign essential HTN 11/05/2014  .  Cramps of lower extremity 11/05/2014  . Chronic LBP 11/05/2014  . Chronic bronchitis ([place]) 11/05/2014  . Cystitis 11/05/2014  . Diverticulosis of colon 11/05/2014  . Dyslipidemia 11/05/2014  . Elevated erythrocyte sedimentation rate 11/05/2014  . Gastro-esophageal reflux disease without esophagitis 11/05/2014  . Bleeding internal hemorrhoids 11/05/2014  . Disease of spleen 11/05/2014  . Perennial allergic rhinitis 11/05/2014  . Obesity (BMI 30-39.9) 11/05/2014  . Hemorrhage of rectum and anus 11/05/2014  . Rotator cuff syndrome 11/05/2014  . Leg varices 11/05/2014  . Vitamin D deficiency 11/05/2014  . DJD of shoulder 10/16/2014  . DDD (degenerative disc disease), lumbosacral 09/18/2014  . DDD (degenerative disc disease), cervical 09/18/2014  . DJD (degenerative joint disease) 09/18/2014  . Neuropathy due to secondary diabetes ([place]) 09/18/2014  . Diabetes mellitus type 2, uncontrolled ([place]) 07/14/2014  . Long term current use of insulin ([place]) 07/14/2014  . Arthritis, degenerative 09/30/2013  . Arthritis or polyarthritis, rheumatoid ([place]) 09/30/2013    Past Surgical History:  Procedure Laterality Date  . ABDOMINAL HYSTERECTOMY     partial  . BACK SURGERY    . CHOLECYSTECTOMY    . COLONOSCOPY WITH PROPOFOL N/A 08/18/2015   Procedure: COLONOSCOPY WITH PROPOFOL;  Surgeon: Manya Silvas, MD;  Location: [facility] ENDOSCOPY;  Service: Endoscopy;  Laterality: N/A;  . ECTOPIC PREGNANCY SURGERY    . ESOPHAGOGASTRODUODENOSCOPY (EGD) WITH PROPOFOL N/A 08/18/2015  Procedure: ESOPHAGOGASTRODUODENOSCOPY (EGD) WITH PROPOFOL;  Surgeon: Manya Silvas, MD;  Location: [facility] ENDOSCOPY;  Service: Endoscopy;  Laterality: N/A;  . FOOT SURGERY    . NECK SURGERY    . SHOULDER SURGERY Right     Prior to Admission medications   Medication Sig Start Date End Date Taking? Authorizing Provider  allopurinol (ZYLOPRIM) 100 MG tablet Take 1 tablet (100 mg total) by mouth daily. Patient not taking: Reported  on 11/11/2015 12/02/14   Steele Sizer, MD  amLODipine (NORVASC) 10 MG tablet TAKE 1 TABLET(10 MG TOTAL) BY MOUTH DAILY. 11/08/15   Steele Sizer, MD  aspirin 81 MG tablet Take 81 mg by mouth daily.    Historical Provider, MD  B-12, METHYLCOBALAMIN, SL Place 5,000 mcg under the tongue daily.    Historical Provider, MD  BD INSULIN SYRINGE ULTRAFINE 31G X 15/64" 1 ML MISC  09/24/14   Historical Provider, MD  dicyclomine (BENTYL) 20 MG tablet Take 1 tablet (20 mg total) by mouth 3 (three) times daily as needed. Patient not taking: Reported on 01/03/2016 07/19/15   Orbie Pyo, MD  ergocalciferol (VITAMIN D2) 50000 UNITS capsule Take 1 capsule (50,000 Units total) by mouth once a week. Patient not taking: Reported on 11/11/2015 12/02/14   Steele Sizer, MD  ergocalciferol (VITAMIN D2) 50000 UNITS capsule Take 50,000 Units by mouth once a week. Reported on 11/11/2015    Historical Provider, MD  fluticasone (FLONASE) 50 MCG/ACT nasal spray Place 2 sprays into both nostrils as needed for allergies. 11/06/14   Steele Sizer, MD  gabapentin (NEURONTIN) 300 MG capsule Limit 1-2 tabs by mouth 2-3 times per day if tolerated 01/03/16   Mohammed Kindle, MD  glipiZIDE (GLUCOTROL) 10 MG tablet Take 5 mg by mouth 2 (two) times daily before a meal.     Historical Provider, MD  GLUCOSE BLOOD VI  04/21/11   Historical Provider, MD  HYDROcodone-acetaminophen (NORCO/VICODIN) 5-325 MG tablet Limit 1 tab by mouth per day or twice a day if tolerated 01/03/16   Mohammed Kindle, MD  hydroxychloroquine (PLAQUENIL) 200 MG tablet Take 200 mg by mouth 2 (two) times daily.     Historical Provider, MD  insulin lispro (HUMALOG) 100 UNIT/ML injection Inject 65 Units into the skin 2 (two) times daily.    Historical Provider, MD  insulin NPH Human (HUMULIN N,NOVOLIN N) 100 UNIT/ML injection Inject 85 Units into the skin 2 (two) times daily before a meal. 9 am and 9 pm    Historical Provider, MD  metFORMIN (GLUCOPHAGE) 500 MG tablet Take  500 mg by mouth daily.    Historical Provider, MD  metoprolol succinate (TOPROL-XL) 50 MG 24 hr tablet Take 1 tablet (50 mg total) by mouth daily. Take with or immediately following a meal. 11/06/14   Steele Sizer, MD  omeprazole (PRILOSEC) 20 MG capsule Take 20 mg by mouth daily. Reported on 11/11/2015    Historical Provider, MD  pantoprazole (PROTONIX) 20 MG tablet Take 20 mg by mouth 2 (two) times daily.    Historical Provider, MD  pravastatin (PRAVACHOL) 40 MG tablet TAKE 1 TABLET BY MOUTH EVERY EVENING FOR CHOLESTEROL IN PLACE OF LIPITOR 02/05/15   Steele Sizer, MD  predniSONE (STERAPRED UNI-PAK 48 TAB) 5 MG (48) TBPK tablet Take 5 mg by mouth daily. Taper dose pack    Historical Provider, MD  ranitidine (ZANTAC) 150 MG tablet Take 1 tablet (150 mg total) by mouth 2 (two) times daily. Patient not taking: Reported on 01/03/2016 11/20/14  Steele Sizer, MD  sertraline (ZOLOFT) 50 MG tablet Take 50 mg by mouth daily.    Historical Provider, MD  sucralfate (CARAFATE) 1 G tablet Take 1 tablet by mouth 4 (four) times daily. Reported on 11/11/2015 11/26/14   Historical Provider, MD  telmisartan-hydrochlorothiazide (MICARDIS HCT) 80-25 MG per tablet Take 1 tablet by mouth daily. 11/06/14   Steele Sizer, MD    Allergies Ace inhibitors and Sulfa antibiotics  Family History  Problem Relation Age of Onset  . Diabetes Mother   . Diabetes Father   . Kidney disease Father   . Diabetes Sister   . Kidney disease Brother     Dialysis  . Stroke Brother   . Breast cancer Neg Hx     Social History Social History  Substance Use Topics  . Smoking status: Former Smoker    Packs/day: 0.50    Years: 32.00    Types: Cigarettes    Start date: 05/15/1972    Quit date: 09/20/2004  . Smokeless tobacco: Never Used  . Alcohol use No    Review of Systems Constitutional: No fever/chills Eyes: No visual changes. ENT: No sore throat. Cardiovascular: Denies chest pain. Respiratory: Denies shortness of  breath. Gastrointestinal: No abdominal pain.  No nausea, no vomiting.  No diarrhea.  No constipation. Genitourinary: Negative for dysuria. Musculoskeletal: Negative for back pain. Skin: Negative for rash. Neurological: Negative for headaches, focal weakness or numbness.  10-point ROS otherwise negative.  ____________________________________________   PHYSICAL EXAM:  VITAL SIGNS: ED Triage Vitals  Enc Vitals Group     BP 03/14/16 0553 (!) 184/100     Pulse Rate 03/14/16 0553 83     Resp 03/14/16 0553 20     Temp 03/14/16 0553 98.5 F (36.9 C)     Temp Source 03/14/16 0553 Oral     SpO2 03/14/16 0553 100 %     Weight 03/14/16 0554 228 lb (103.4 kg)     Height 03/14/16 0554 '5\' 6"'$  (1.676 m)     Head Circumference --      Peak Flow --      Pain Score 03/14/16 0555 8     Pain Loc --      Pain Edu? --      Excl. in [place]? --     Constitutional: Alert and oriented. Well appearing and in no acute distress. Eyes: Conjunctivae are normal. PERRL. EOMI. Head: Atraumatic.There is mild tenderness over the paraspinous muscles involving the upper cervical spine. The patient here radiates headache type pain over right side of the face. Patient reports she has had this type of pain for some time. No tenderness over the temporal artery bilateral. Normal temporal artery pulsations bilateral. Nose: No congestion/rhinnorhea. Mouth/Throat: Mucous membranes are moist.  Oropharynx non-erythematous. Neck: No stridor.  No cervical tenderness. Cardiovascular: Normal rate, regular rhythm. Grossly normal heart sounds.  Good peripheral circulation. Respiratory: Normal respiratory effort.  No retractions. Lungs CTAB. Gastrointestinal: Soft and nontender. No distention. No abdominal bruits. No CVA tenderness. Musculoskeletal: No lower extremity tenderness nor edema.  No joint effusions. Neurologic:  Normal speech and language. No gross focal neurologic deficits are appreciated. No gait instability. Normal  cranial nerve exam. Skin:  Skin is warm, dry and intact. No rash noted. Psychiatric: Mood and affect are normal. Speech and behavior are normal.  ____________________________________________   LABS (all labs ordered are listed, but only abnormal results are displayed)  Labs Reviewed  CBC WITH DIFFERENTIAL/PLATELET - Abnormal; Notable for the following:  Result Value   Hemoglobin 11.8 (*)    HCT 34.4 (*)    RDW 16.1 (*)    All other components within normal limits  COMPREHENSIVE METABOLIC PANEL - Abnormal; Notable for the following:    Glucose, Bld 176 (*)    Total Bilirubin 0.1 (*)    All other components within normal limits  URINALYSIS COMPLETEWITH MICROSCOPIC ([facility] ONLY) - Abnormal; Notable for the following:    Color, Urine YELLOW (*)    APPearance CLEAR (*)    Squamous Epithelial / LPF 0-5 (*)    All other components within normal limits  GLUCOSE, CAPILLARY - Abnormal; Notable for the following:    Glucose-Capillary 178 (*)    All other components within normal limits  SEDIMENTATION RATE - Abnormal; Notable for the following:    Sed Rate 45 (*)    All other components within normal limits  C-REACTIVE PROTEIN   ____________________________________________  EKG  Reviewed and to revive me at 7:30 AM Rate 75 QRS 100 QTc 440 Normal sinus rhythm, no evidence of ischemia. Mild baseline wander. ____________________________________________  RADIOLOGY  Ct Head Wo Contrast  Result Date: 03/14/2016 CLINICAL DATA:  Blurred vision and headache. EXAM: CT HEAD WITHOUT CONTRAST TECHNIQUE: Contiguous axial images were obtained from the base of the skull through the vertex without intravenous contrast. COMPARISON:  04/05/2006 FINDINGS: Brain: No evidence for acute hemorrhage, mass lesion, midline shift, hydrocephalus or large new infarct. There is subtle low-density in the subcortical white matter suggesting chronic changes. White matter changes most prominent in left  parietal lobe region. There is a focal low-density near the left external capsule which appears chronic and may be related to the adjacent sulcus. Vascular: No hyperdense vessel or unexpected calcification. Skull: Normal. Negative for fracture or focal lesion. Sinuses/Orbits: No acute finding. Other: None. IMPRESSION: No acute intracranial abnormality. Patchy white matter changes. This could represent chronic small vessel ischemic disease. Electronically Signed   By: Markus Daft M.D.   On: 03/14/2016 07:29    ____________________________________________   PROCEDURES  Procedure(s) performed: None  Procedures  Critical Care performed: No  ____________________________________________   INITIAL IMPRESSION / ASSESSMENT AND PLAN / ED COURSE  Pertinent labs & imaging results that were available during my care of the patient were reviewed by me and considered in my medical decision making (see chart for details).    Patient presents for right occipital headache over the last week, also reported vision changes though of blurriness earlier in the week but reports this is improved. Presently neurologically intact. No evidence of temporal arteritis by examination, ESR pending. Patient denies eye pain. Normal extraocular movements and normal cranial nerve exam. No overt evidence of stroke, head CT performed to evaluate for any evidence of small stroke although this is felt to be unlikely    Clinical Course  Comment By Time  Arrived to my shift. Going to see patient now. Delman Kitten, MD 10/31 0701    ----------------------------------------- 9:37 AM on 03/14/2016 -----------------------------------------  Patient resting comfortably. Reviewed test results, and discussed with patient I suggest she call Dr. Scharlene Gloss clinic and set up close follow-up, and also discussed symptoms with his clinic staff today. Patient is agreeable. Her ESR is elevated, possibly due to her known rheumatoid flare  without suggestion of acute giant cell arteritis by clinical exam at this time. I will refer her back to Dr. Jefm Bryant, and provided careful return precautions. The patient is very agreeable. ____________________________________________   FINAL CLINICAL IMPRESSION(S) / ED DIAGNOSES  Final diagnoses:  Cervicalgia of occipito-atlanto-axial region      NEW MEDICATIONS STARTED DURING THIS VISIT:  New Prescriptions   No medications on file     Note:  This document was prepared using Dragon voice recognition software and may include unintentional dictation errors.     Delman Kitten, MD 03/14/16 [phone]

## 2016-03-14 NOTE — ED Notes (Signed)
EDP at bedside  

## 2016-03-14 NOTE — ED Notes (Signed)
Pt resting in bed, husband at bedside, pt awake and alert in no acute distress 

## 2016-03-14 NOTE — ED Triage Notes (Addendum)
Pt presents to ED with blurred vision for the past week with headache and swelling to her fingers. Pt states her MD thinks symptom is a results of her diabetes  it is due to her diabetes. Pt states she also noticed a bump on the right side of her neck that radiates around to her right eye. Area is tender to the touch.  +nausea

## 2016-03-14 NOTE — ED Notes (Signed)
Pharmacy called for Micardis, states they will verify and send medication, pt resting in bed, husband at bedside, pt in no acute distress

## 2016-03-15 DIAGNOSIS — Z79899 Other long term (current) drug therapy: Secondary | ICD-10-CM | POA: Diagnosis not present

## 2016-03-15 DIAGNOSIS — E113291 Type 2 diabetes mellitus with mild nonproliferative diabetic retinopathy without macular edema, right eye: Secondary | ICD-10-CM | POA: Diagnosis not present

## 2016-03-15 DIAGNOSIS — M069 Rheumatoid arthritis, unspecified: Secondary | ICD-10-CM | POA: Diagnosis not present

## 2016-03-16 ENCOUNTER — Telehealth: Payer: Self-pay | Admitting: Emergency Medicine

## 2016-03-16 NOTE — Telephone Encounter (Signed)
Called patient to tell her creactive prot. Test was back and is elevated.  I advised her to call her rheumatologist or her pcp and have them review her tests from here.  She agrees.

## 2016-03-20 DIAGNOSIS — M549 Dorsalgia, unspecified: Secondary | ICD-10-CM | POA: Diagnosis not present

## 2016-03-20 DIAGNOSIS — K219 Gastro-esophageal reflux disease without esophagitis: Secondary | ICD-10-CM | POA: Diagnosis not present

## 2016-03-20 DIAGNOSIS — K760 Fatty (change of) liver, not elsewhere classified: Secondary | ICD-10-CM | POA: Diagnosis not present

## 2016-03-21 DIAGNOSIS — E7800 Pure hypercholesterolemia, unspecified: Secondary | ICD-10-CM | POA: Diagnosis not present

## 2016-03-21 DIAGNOSIS — R7301 Impaired fasting glucose: Secondary | ICD-10-CM | POA: Diagnosis not present

## 2016-03-21 DIAGNOSIS — E559 Vitamin D deficiency, unspecified: Secondary | ICD-10-CM | POA: Diagnosis not present

## 2016-03-21 DIAGNOSIS — Z23 Encounter for immunization: Secondary | ICD-10-CM | POA: Diagnosis not present

## 2016-03-22 DIAGNOSIS — E1129 Type 2 diabetes mellitus with other diabetic kidney complication: Secondary | ICD-10-CM | POA: Diagnosis not present

## 2016-03-22 DIAGNOSIS — Z794 Long term (current) use of insulin: Secondary | ICD-10-CM | POA: Diagnosis not present

## 2016-03-22 DIAGNOSIS — R809 Proteinuria, unspecified: Secondary | ICD-10-CM | POA: Diagnosis not present

## 2016-03-22 DIAGNOSIS — E1165 Type 2 diabetes mellitus with hyperglycemia: Secondary | ICD-10-CM | POA: Diagnosis not present

## 2016-03-22 DIAGNOSIS — E1142 Type 2 diabetes mellitus with diabetic polyneuropathy: Secondary | ICD-10-CM | POA: Diagnosis not present

## 2016-03-27 DIAGNOSIS — M461 Sacroiliitis, not elsewhere classified: Secondary | ICD-10-CM | POA: Diagnosis not present

## 2016-03-27 DIAGNOSIS — E1340 Other specified diabetes mellitus with diabetic neuropathy, unspecified: Secondary | ICD-10-CM | POA: Diagnosis not present

## 2016-03-27 DIAGNOSIS — M5416 Radiculopathy, lumbar region: Secondary | ICD-10-CM | POA: Diagnosis not present

## 2016-03-27 DIAGNOSIS — M792 Neuralgia and neuritis, unspecified: Secondary | ICD-10-CM | POA: Diagnosis not present

## 2016-04-18 DIAGNOSIS — E1142 Type 2 diabetes mellitus with diabetic polyneuropathy: Secondary | ICD-10-CM | POA: Diagnosis not present

## 2016-04-18 DIAGNOSIS — Z794 Long term (current) use of insulin: Secondary | ICD-10-CM | POA: Diagnosis not present

## 2016-04-18 DIAGNOSIS — E1129 Type 2 diabetes mellitus with other diabetic kidney complication: Secondary | ICD-10-CM | POA: Diagnosis not present

## 2016-04-18 DIAGNOSIS — R809 Proteinuria, unspecified: Secondary | ICD-10-CM | POA: Diagnosis not present

## 2016-04-18 DIAGNOSIS — E1165 Type 2 diabetes mellitus with hyperglycemia: Secondary | ICD-10-CM | POA: Diagnosis not present

## 2016-04-25 DIAGNOSIS — E1340 Other specified diabetes mellitus with diabetic neuropathy, unspecified: Secondary | ICD-10-CM | POA: Diagnosis not present

## 2016-04-25 DIAGNOSIS — M5416 Radiculopathy, lumbar region: Secondary | ICD-10-CM | POA: Diagnosis not present

## 2016-04-25 DIAGNOSIS — M461 Sacroiliitis, not elsewhere classified: Secondary | ICD-10-CM | POA: Diagnosis not present

## 2016-04-25 DIAGNOSIS — M792 Neuralgia and neuritis, unspecified: Secondary | ICD-10-CM | POA: Diagnosis not present

## 2016-05-04 DIAGNOSIS — M0579 Rheumatoid arthritis with rheumatoid factor of multiple sites without organ or systems involvement: Secondary | ICD-10-CM | POA: Diagnosis not present

## 2016-05-11 DIAGNOSIS — M65332 Trigger finger, left middle finger: Secondary | ICD-10-CM | POA: Diagnosis not present

## 2016-05-11 DIAGNOSIS — G8929 Other chronic pain: Secondary | ICD-10-CM | POA: Diagnosis not present

## 2016-05-11 DIAGNOSIS — R110 Nausea: Secondary | ICD-10-CM | POA: Diagnosis not present

## 2016-05-11 DIAGNOSIS — M545 Low back pain: Secondary | ICD-10-CM | POA: Diagnosis not present

## 2016-05-11 DIAGNOSIS — M0579 Rheumatoid arthritis with rheumatoid factor of multiple sites without organ or systems involvement: Secondary | ICD-10-CM | POA: Diagnosis not present

## 2016-05-15 DIAGNOSIS — M7542 Impingement syndrome of left shoulder: Secondary | ICD-10-CM

## 2016-05-15 HISTORY — DX: Impingement syndrome of left shoulder: M75.42

## 2016-05-29 ENCOUNTER — Other Ambulatory Visit: Payer: Self-pay | Admitting: Pain Medicine

## 2016-07-18 DIAGNOSIS — M7542 Impingement syndrome of left shoulder: Secondary | ICD-10-CM | POA: Insufficient documentation

## 2016-10-17 ENCOUNTER — Other Ambulatory Visit: Payer: Self-pay | Admitting: Student

## 2016-10-17 DIAGNOSIS — R1013 Epigastric pain: Secondary | ICD-10-CM

## 2016-10-17 DIAGNOSIS — K760 Fatty (change of) liver, not elsewhere classified: Secondary | ICD-10-CM

## 2016-10-20 ENCOUNTER — Ambulatory Visit
Admission: RE | Admit: 2016-10-20 | Discharge: 2016-10-20 | Disposition: A | Discharge status: Home / Self Care | Payer: Medicare Other | Source: Ambulatory Visit | Attending: Student | Admitting: Student

## 2016-10-20 DIAGNOSIS — K760 Fatty (change of) liver, not elsewhere classified: Secondary | ICD-10-CM | POA: Insufficient documentation

## 2016-10-20 DIAGNOSIS — R1013 Epigastric pain: Secondary | ICD-10-CM | POA: Diagnosis not present

## 2016-10-29 ENCOUNTER — Observation Stay
Admission: EM | Admit: 2016-10-29 | Discharge: 2016-10-30 | Disposition: A | Discharge status: Home / Self Care | Payer: Medicare Other | Attending: Internal Medicine | Admitting: Internal Medicine

## 2016-10-29 ENCOUNTER — Emergency Department: Payer: Medicare Other

## 2016-10-29 ENCOUNTER — Encounter: Payer: Self-pay | Admitting: *Deleted

## 2016-10-29 DIAGNOSIS — K219 Gastro-esophageal reflux disease without esophagitis: Secondary | ICD-10-CM | POA: Insufficient documentation

## 2016-10-29 DIAGNOSIS — E1165 Type 2 diabetes mellitus with hyperglycemia: Secondary | ICD-10-CM | POA: Insufficient documentation

## 2016-10-29 DIAGNOSIS — E1140 Type 2 diabetes mellitus with diabetic neuropathy, unspecified: Secondary | ICD-10-CM | POA: Diagnosis not present

## 2016-10-29 DIAGNOSIS — E559 Vitamin D deficiency, unspecified: Secondary | ICD-10-CM | POA: Diagnosis not present

## 2016-10-29 DIAGNOSIS — E7800 Pure hypercholesterolemia, unspecified: Secondary | ICD-10-CM | POA: Insufficient documentation

## 2016-10-29 DIAGNOSIS — K2980 Duodenitis without bleeding: Principal | ICD-10-CM | POA: Insufficient documentation

## 2016-10-29 DIAGNOSIS — F329 Major depressive disorder, single episode, unspecified: Secondary | ICD-10-CM | POA: Diagnosis not present

## 2016-10-29 DIAGNOSIS — E538 Deficiency of other specified B group vitamins: Secondary | ICD-10-CM | POA: Insufficient documentation

## 2016-10-29 DIAGNOSIS — IMO0002 Reserved for concepts with insufficient information to code with codable children: Secondary | ICD-10-CM | POA: Diagnosis present

## 2016-10-29 DIAGNOSIS — Z7982 Long term (current) use of aspirin: Secondary | ICD-10-CM | POA: Diagnosis not present

## 2016-10-29 DIAGNOSIS — I10 Essential (primary) hypertension: Secondary | ICD-10-CM | POA: Diagnosis not present

## 2016-10-29 DIAGNOSIS — Z79899 Other long term (current) drug therapy: Secondary | ICD-10-CM | POA: Insufficient documentation

## 2016-10-29 DIAGNOSIS — E785 Hyperlipidemia, unspecified: Secondary | ICD-10-CM | POA: Insufficient documentation

## 2016-10-29 DIAGNOSIS — Z87891 Personal history of nicotine dependence: Secondary | ICD-10-CM | POA: Diagnosis not present

## 2016-10-29 DIAGNOSIS — Z794 Long term (current) use of insulin: Secondary | ICD-10-CM | POA: Diagnosis not present

## 2016-10-29 DIAGNOSIS — R778 Other specified abnormalities of plasma proteins: Secondary | ICD-10-CM | POA: Diagnosis present

## 2016-10-29 DIAGNOSIS — R109 Unspecified abdominal pain: Secondary | ICD-10-CM | POA: Diagnosis present

## 2016-10-29 DIAGNOSIS — R7989 Other specified abnormal findings of blood chemistry: Secondary | ICD-10-CM | POA: Diagnosis present

## 2016-10-29 DIAGNOSIS — K589 Irritable bowel syndrome without diarrhea: Secondary | ICD-10-CM | POA: Diagnosis not present

## 2016-10-29 LAB — URINALYSIS, COMPLETE (UACMP) WITH MICROSCOPIC
Bacteria, UA: NONE SEEN
Bilirubin Urine: NEGATIVE
GLUCOSE, UA: NEGATIVE mg/dL
HGB URINE DIPSTICK: NEGATIVE
Ketones, ur: NEGATIVE mg/dL
Leukocytes, UA: NEGATIVE
Nitrite: NEGATIVE
PROTEIN: NEGATIVE mg/dL
Specific Gravity, Urine: 1.015 (ref 1.005–1.030)
pH: 7 (ref 5.0–8.0)

## 2016-10-29 LAB — COMPREHENSIVE METABOLIC PANEL
ALBUMIN: 4.1 g/dL (ref 3.5–5.0)
ALK PHOS: 73 U/L (ref 38–126)
ALT: 15 U/L (ref 14–54)
AST: 20 U/L (ref 15–41)
Anion gap: 7 (ref 5–15)
BUN: 12 mg/dL (ref 6–20)
CHLORIDE: 101 mmol/L (ref 101–111)
CO2: 29 mmol/L (ref 22–32)
CREATININE: 0.91 mg/dL (ref 0.44–1.00)
Calcium: 9.7 mg/dL (ref 8.9–10.3)
GFR calc Af Amer: >60 mL/min (ref >60)
GFR calc non Af Amer: >60 mL/min (ref >60)
GLUCOSE: 179 mg/dL — AB (ref 65–99)
Potassium: 3.7 mmol/L (ref 3.5–5.1)
SODIUM: 137 mmol/L (ref 135–145)
Total Bilirubin: 0.5 mg/dL (ref 0.3–1.2)
Total Protein: 8.1 g/dL (ref 6.5–8.1)

## 2016-10-29 LAB — CBC
HCT: 37.9 % (ref 35.0–47.0)
Hemoglobin: 12.6 g/dL (ref 12.0–16.0)
MCH: 27.1 pg (ref 26.0–34.0)
MCHC: 33.3 g/dL (ref 32.0–36.0)
MCV: 81.5 fL (ref 80.0–100.0)
PLATELETS: 392 10*3/uL (ref 150–440)
RBC: 4.65 MIL/uL (ref 3.80–5.20)
RDW: 14.9 % — ABNORMAL HIGH (ref 11.5–14.5)
WBC: 7.9 10*3/uL (ref 3.6–11.0)

## 2016-10-29 LAB — TROPONIN I
TROPONIN I: 0.03 ng/mL — AB (ref <0.03)
Troponin I: 0.03 ng/mL (ref <0.03)

## 2016-10-29 LAB — LIPASE, BLOOD: Lipase: 44 U/L (ref 11–51)

## 2016-10-29 MED ORDER — IOPAMIDOL (ISOVUE-300) INJECTION 61%
30.0000 mL | Freq: Once | INTRAVENOUS | Status: AC | PRN
  Administered 2016-10-29: 30 mL via ORAL

## 2016-10-29 MED ORDER — SODIUM CHLORIDE 0.9 % IV BOLUS (SEPSIS)
1000.0000 mL | Freq: Once | INTRAVENOUS | Status: AC
Start: 2016-10-29 — End: 2016-10-30
  Administered 2016-10-29: 1000 mL via INTRAVENOUS

## 2016-10-29 MED ORDER — ONDANSETRON HCL 4 MG/2ML IJ SOLN
4.0000 mg | Freq: Once | INTRAMUSCULAR | Status: AC
  Administered 2016-10-29: 4 mg via INTRAVENOUS
  Filled 2016-10-29: qty 2

## 2016-10-29 MED ORDER — MORPHINE SULFATE (PF) 4 MG/ML IV SOLN
4.0000 mg | Freq: Once | INTRAVENOUS | Status: AC
Start: 2016-10-29 — End: 2016-10-29
  Administered 2016-10-29: 4 mg via INTRAVENOUS
  Filled 2016-10-29: qty 1

## 2016-10-29 MED ORDER — IOPAMIDOL (ISOVUE-300) INJECTION 61%
100.0000 mL | Freq: Once | INTRAVENOUS | Status: AC | PRN
  Administered 2016-10-29: 100 mL via INTRAVENOUS

## 2016-10-29 MED ORDER — MORPHINE SULFATE (PF) 4 MG/ML IV SOLN
4.0000 mg | Freq: Once | INTRAVENOUS | Status: AC
  Administered 2016-10-29: 4 mg via INTRAVENOUS
  Filled 2016-10-29: qty 1

## 2016-10-29 MED ORDER — PANTOPRAZOLE SODIUM 40 MG IV SOLR
40.0000 mg | Freq: Once | INTRAVENOUS | Status: AC
  Administered 2016-10-29: 40 mg via INTRAVENOUS
  Filled 2016-10-29: qty 40

## 2016-10-29 MED ORDER — SENNOSIDES-DOCUSATE SODIUM 8.6-50 MG PO TABS
1.0000 | ORAL_TABLET | Freq: Two times a day (BID) | ORAL | Status: DC
  Administered 2016-10-30 (×2): 1 via ORAL
  Filled 2016-10-29 (×2): qty 1

## 2016-10-29 NOTE — ED Triage Notes (Signed)
  PT to ED reporting upper abd pain x 2 weeks. Pt reports having been seen at the gastric clinic on 6/5 and was told after having an ultrasound that her diverticulitis was acting up. PT reports having now changes in symptoms after taken prescribed medications. PT continues to reports bloating and constipation with nausea and vomiting yellow bile throughout last week.  Pt also reports having had a fever two weeks ago but is afebrile today.

## 2016-10-29 NOTE — ED Notes (Signed)
NAD noted at this time. Pt resting in bed at this time with husband at bedside, states pain is decreased to a 7/10. Pt is alert and and oriented. Will continue to monitor for further patient needs at this time.

## 2016-10-29 NOTE — ED Notes (Signed)
Carlynn Purl, to bedside at this time to place patient on the monitor and introduce self to patient

## 2016-10-29 NOTE — ED Notes (Signed)
Pt placed back on monitor at this time. States pain originally eased off and now has returned.

## 2016-10-29 NOTE — ED Notes (Signed)
Pt taken to CT at this time.

## 2016-10-29 NOTE — ED Notes (Signed)
Date and time results received: 10/29/16 1915   Test: Trop Critical Value: 0.03  Name of Provider Notified: Dr. Clearnce Hasten  Orders Received? Or Actions Taken?: Acknowledged, no new orders received.

## 2016-10-29 NOTE — H&P (Signed)
[facility] [place] NAME: Charlotte [name]    MR#:  [id number]  DATE OF BIRTH:  11-15-1955  DATE OF ADMISSION:  10/29/2016  PRIMARY CARE PHYSICIAN: Hortencia Pilar, MD   REQUESTING/REFERRING PHYSICIAN: Clearnce Hasten, MD  CHIEF COMPLAINT:   Chief Complaint  Patient presents with  . Abdominal Pain    HISTORY OF PRESENT ILLNESS:  Charlotte [name]  is a 61 y.o. female who presents with Significant epigastric abdominal pain. Patient states that she's had this pain for about 2 weeks now. She went to see her outpatient gastroenterologist, who placed her on PPI and sucralfate. Her pain got significantly worse and she came to the ED tonight for evaluation. CT scan here shows duodenitis versus pancreatitis. Given her lipase was normal, duodenitis as the favored differential.  PAST MEDICAL HISTORY:   Past Medical History:  Diagnosis Date  . DDD (degenerative disc disease), lumbar   . Depression   . Diabetes mellitus without complication ([place])   . Diverticulitis   . Fatty liver   . Fibroid tumor   . GERD (gastroesophageal reflux disease)   . Gout   . H pylori ulcer   . Hemorrhoids   . Hypercholesteremia   . Hypertension   . IBS (irritable bowel syndrome)   . Spinal stenosis     PAST SURGICAL HISTORY:   Past Surgical History:  Procedure Laterality Date  . ABDOMINAL HYSTERECTOMY     partial  . BACK SURGERY    . CHOLECYSTECTOMY    . COLONOSCOPY WITH PROPOFOL N/A 08/18/2015   Procedure: COLONOSCOPY WITH PROPOFOL;  Surgeon: Manya Silvas, MD;  Location[facility] ENDOSCOPY;  Service: Endoscopy;  Laterality: N/A;  . ECTOPIC PREGNANCY SURGERY    . ESOPHAGOGASTRODUODENOSCOPY (EGD) WITH PROPOFOL N/A 08/18/2015   Procedure: ESOPHAGOGASTRODUODENOSCOPY (EGD) WITH PROPOFOL;  Surgeon: Manya Silvas, MD;  Location: [facility] ENDOSCOPY;  Service: Endoscopy;  Laterality: N/A;  . FOOT SURGERY    . NECK SURGERY    . SHOULDER SURGERY Right     SOCIAL HISTORY:    Social History  Substance Use Topics  . Smoking status: Former Smoker    Packs/day: 0.50    Years: 32.00    Types: Cigarettes    Start date: 05/15/1972    Quit date: 09/20/2004  . Smokeless tobacco: Never Used  . Alcohol use No    FAMILY HISTORY:   Family History  Problem Relation Age of Onset  . Diabetes Mother   . Diabetes Father   . Kidney disease Father   . Diabetes Sister   . Kidney disease Brother        Dialysis  . Stroke Brother   . Breast cancer Neg Hx     DRUG ALLERGIES:   Allergies  Allergen Reactions  . Ace Inhibitors     Other reaction(s): Unknown Pt can't remember what her reaction is and she can't remember what the name of the BP med   . Sulfa Antibiotics Rash    MEDICATIONS AT HOME:   Prior to Admission medications   Medication Sig Start Date End Date Taking? Authorizing Provider  amLODipine (NORVASC) 10 MG tablet TAKE 1 TABLET(10 MG TOTAL) BY MOUTH DAILY. 11/08/15  Yes Steele Sizer, MD  aspirin 81 MG tablet Take 81 mg by mouth daily.   Yes [provider]  fluticasone (FLONASE) 50 MCG/ACT nasal spray Place 2 sprays into both nostrils as needed for allergies. 11/06/14  Yes Steele Sizer, MD  gabapentin (NEURONTIN) 300  MG capsule Limit 1-2 tabs by mouth 2-3 times per day if tolerated 01/03/16  Yes Mohammed Kindle, MD  glipiZIDE (GLUCOTROL) 10 MG tablet Take 10 mg by mouth daily before breakfast.    Yes [provider]  hydroxychloroquine (PLAQUENIL) 200 MG tablet Take 200 mg by mouth 2 (two) times daily.    Yes [provider]  insulin lispro (HUMALOG) 100 UNIT/ML injection Inject 65 Units into the skin daily. In middle of day   Yes [provider]  insulin NPH Human (HUMULIN N,NOVOLIN N) 100 UNIT/ML injection Inject 85 Units into the skin 2 (two) times daily before a meal. 9 am and 9 pm   Yes [provider]  losartan-hydrochlorothiazide (HYZAAR) 100-25 MG tablet Take 1 tablet by mouth daily. 10/14/16  Yes  [provider]  metFORMIN (GLUCOPHAGE-XR) 500 MG 24 hr tablet Take 500 mg by mouth daily. 09/05/16  Yes [provider]  metoprolol succinate (TOPROL-XL) 50 MG 24 hr tablet Take 1 tablet (50 mg total) by mouth daily. Take with or immediately following a meal. 11/06/14  Yes Sowles, Drue Stager, MD  pantoprazole (PROTONIX) 40 MG tablet Take 40 mg by mouth 2 (two) times daily. 10/17/16  Yes [provider]  pravastatin (PRAVACHOL) 40 MG tablet TAKE 1 TABLET BY MOUTH EVERY EVENING FOR CHOLESTEROL IN PLACE OF LIPITOR 02/05/15  Yes Sowles, Drue Stager, MD  sertraline (ZOLOFT) 50 MG tablet Take 50 mg by mouth daily.   Yes [provider]  sucralfate (CARAFATE) 1 G tablet Take 1 tablet by mouth 4 (four) times daily. Reported on 11/11/2015 11/26/14  Yes [provider]  allopurinol (ZYLOPRIM) 100 MG tablet Take 1 tablet (100 mg total) by mouth daily. Patient not taking: Reported on 11/11/2015 12/02/14   Steele Sizer, MD  dicyclomine (BENTYL) 20 MG tablet Take 1 tablet (20 mg total) by mouth 3 (three) times daily as needed. Patient not taking: Reported on 01/03/2016 07/19/15   Orbie Pyo, MD  ergocalciferol (VITAMIN D2) 50000 UNITS capsule Take 1 capsule (50,000 Units total) by mouth once a week. Patient not taking: Reported on 11/11/2015 12/02/14   Steele Sizer, MD  HYDROcodone-acetaminophen (NORCO/VICODIN) 5-325 MG tablet Limit 1 tab by mouth per day or twice a day if tolerated Patient not taking: Reported on 10/29/2016 01/03/16   Mohammed Kindle, MD  ranitidine (ZANTAC) 150 MG tablet Take 1 tablet (150 mg total) by mouth 2 (two) times daily. Patient not taking: Reported on 01/03/2016 11/20/14   Steele Sizer, MD  telmisartan-hydrochlorothiazide (MICARDIS HCT) 80-25 MG per tablet Take 1 tablet by mouth daily. Patient not taking: Reported on 10/29/2016 11/06/14   Steele Sizer, MD    REVIEW OF SYSTEMS:  Review of Systems  Constitutional: Negative for chills,  fever, malaise/fatigue and weight loss.  HENT: Negative for ear pain, hearing loss and tinnitus.   Eyes: Negative for blurred vision, double vision, pain and redness.  Respiratory: Negative for cough, hemoptysis and shortness of breath.   Cardiovascular: Negative for chest pain, palpitations, orthopnea and leg swelling.  Gastrointestinal: Positive for abdominal pain and constipation. Negative for diarrhea, nausea and vomiting.  Genitourinary: Negative for dysuria, frequency and hematuria.  Musculoskeletal: Negative for back pain, joint pain and neck pain.  Skin:       No acne, rash, or lesions  Neurological: Negative for dizziness, tremors, focal weakness and weakness.  Endo/Heme/Allergies: Negative for polydipsia. Does not bruise/bleed easily.  Psychiatric/Behavioral: Negative for depression. The patient is not nervous/anxious and does not have insomnia.  VITAL SIGNS:   Vitals:   10/29/16 1930 10/29/16 2100 10/29/16 2200 10/29/16 2300  BP: (!) 169/100 (!) 151/95 (!) 161/88 (!) 164/99  Pulse: 81 81 78 81  Resp: 19 19 (!) 22 (!) 23  Temp:      TempSrc:      SpO2: 93% 97% 94% 97%  Weight:      Height:       Wt Readings from Last 3 Encounters:  10/29/16 104.3 kg (230 lb)  03/14/16 103.4 kg (228 lb)  01/03/16 106.6 kg (235 lb)    PHYSICAL EXAMINATION:  Physical Exam  LABORATORY PANEL:   CBC  Recent Labs Lab 10/29/16 1556  WBC 7.9  HGB 12.6  HCT 37.9  PLT 392   ------------------------------------------------------------------------------------------------------------------  Chemistries   Recent Labs Lab 10/29/16 1556  NA 137  K 3.7  CL 101  CO2 29  GLUCOSE 179*  BUN 12  CREATININE 0.91  CALCIUM 9.7  AST 20  ALT 15  ALKPHOS 73  BILITOT 0.5   ------------------------------------------------------------------------------------------------------------------  Cardiac Enzymes  Recent Labs Lab 10/29/16 2109  TROPONINI 0.03*    ------------------------------------------------------------------------------------------------------------------  RADIOLOGY:  Ct Abdomen Pelvis W Contrast  Result Date: 10/29/2016 CLINICAL DATA:  61 y/o  F; abdominal pain with nausea and vomiting. EXAM: CT ABDOMEN AND PELVIS WITH CONTRAST TECHNIQUE: Multidetector CT imaging of the abdomen and pelvis was performed using the standard protocol following bolus administration of intravenous contrast. CONTRAST:  118mL ISOVUE-300 IOPAMIDOL (ISOVUE-300) INJECTION 61% COMPARISON:  07/25/2016 CT of the abdomen and pelvis. FINDINGS: Lower chest: No acute abnormality. Hepatobiliary: Hepatic steatosis and mild hepatomegaly. Status post cholecystectomy. Prominence of the common bile duct is likely compensatory post cholecystectomy. Pancreas: There is mild edema surrounding the head of pancreas and along the course of the second and third portions of the duodenum. No acute peripancreatic collection or evidence for pancreatic necrosis. Spleen: Normal in size without focal abnormality. Adrenals/Urinary Tract: Adrenal glands are unremarkable. Kidneys are normal, without renal calculi, focal lesion, or hydronephrosis. Bladder is unremarkable. Stomach/Bowel: Stomach is within normal limits. Appendix appears normal. No evidence of bowel wall thickening, distention, or inflammatory changes. Extensive sigmoid diverticulosis without evidence for acute diverticulitis. Vascular/Lymphatic: Aortic atherosclerosis with minimal calcification. No enlarged abdominal or pelvic lymph nodes. Reproductive: Status post hysterectomy. No adnexal masses. Other: No abdominal wall hernia or abnormality. No abdominopelvic ascites. Musculoskeletal: No acute or significant osseous findings. Advanced lumbar degenerative changes greatest at the L4-5 and L5-S1 levels where there is multifactorial at least moderate canal stenosis. Status post L4-5 laminectomy. IMPRESSION: 1. Edema surrounding the head  of pancreas, along course of second and third portions of duodenum, and duodenum wall thickening. Differential includes acute duodenitis or pancreatitis. No discrete abscess or evidence for bowel perforation. 2. Stable hepatic steatosis and hepatomegaly. 3. Stable extensive sigmoid diverticulosis without evidence for acute diverticulitis. 4. Stable lumbar degenerative changes greatest at L3-4 and L4-5 where there is multifactorial at least moderate canal stenosis. Electronically Signed   By: Kristine Garbe M.D.   On: 10/29/2016 21:16    EKG:   Orders placed or performed during the hospital encounter of 10/29/16  . ED EKG  . ED EKG  . EKG 12-Lead  . EKG 12-Lead    IMPRESSION AND PLAN:  Principal Problem:   Duodenitis - PPI, sucralfate, when necessary analgesia, gastroenterology consult Active Problems:   Elevated troponin - trend her enzymes, 2 enzymes so far only barely positive at 0.03, do not suspect ACS   Diabetes mellitus  type 2, uncontrolled ([place]) - sliding scale insulin with corresponding glucose checks   Benign essential HTN - continue home meds   Dyslipidemia - continue home medications  All the records are reviewed and case discussed with ED provider. Management plans discussed with the patient and/or family.  DVT PROPHYLAXIS: SubQ lovenox  GI PROPHYLAXIS: PPI  ADMISSION STATUS: Observation  CODE STATUS: Full Code Status History    This patient does not have a recorded code status. Please follow your organizational policy for patients in this situation.    Advance Directive Documentation     Most Recent Value  Type of Advance Directive  Living will  Pre-existing out of facility DNR order (yellow form or pink MOST form)  -  "MOST" Form in Place?  -      TOTAL TIME TAKING CARE OF THIS PATIENT: 40 minutes.   Akshara Blumenthal [place] 10/29/2016, 11:21 PM  [facility]  Office  [phone]  CC: Primary care physician; Hortencia Pilar,  MD  Note:  This document was prepared using Dragon voice recognition software and may include unintentional dictation errors.

## 2016-10-29 NOTE — ED Notes (Signed)
Pt up to use the bathroom at this time.

## 2016-10-29 NOTE — ED Provider Notes (Signed)
[facility] Emergency Department Provider Note  ____________________________________________   First MD Initiated Contact with Patient 10/29/16 1714     (approximate)  I have reviewed the triage vital signs and the nursing notes.   HISTORY  Chief Complaint Abdominal Pain   HPI Charlotte [name] is a 61 y.o. female with a history of H. pylori and ulceration was presenting to the emergency department with epigastric pain over the past 2 weeks. She was seen as an outpatient and started on Protonix twice a day as well as sucralfate and Mylanta without relief. Says the pain is sharp and a 15 out of 10 at this time. Says that it intermittently radiates to her back. She has had 2 episodes of vomiting, the last one being yesterday. She denies any diarrhea and says that she has been having difficulty passing stool over the past 2 weeks and usually, under normal circumstances, has 3 bowel movements per day.     Past Medical History:  Diagnosis Date  . DDD (degenerative disc disease), lumbar   . Depression   . Diabetes mellitus without complication ([place])   . Diverticulitis   . Fatty liver   . Fibroid tumor   . GERD (gastroesophageal reflux disease)   . Gout   . H pylori ulcer   . Hemorrhoids   . Hypercholesteremia   . Hypertension   . IBS (irritable bowel syndrome)   . Spinal stenosis     Patient Active Problem List   Diagnosis Date Noted  . Elevated uric acid in blood 12/02/2014  . B12 deficiency 11/05/2014  . Benign essential HTN 11/05/2014  . Cramps of lower extremity 11/05/2014  . Chronic LBP 11/05/2014  . Chronic bronchitis ([place]) 11/05/2014  . Cystitis 11/05/2014  . Diverticulosis of colon 11/05/2014  . Dyslipidemia 11/05/2014  . Elevated erythrocyte sedimentation rate 11/05/2014  . Gastro-esophageal reflux disease without esophagitis 11/05/2014  . Bleeding internal hemorrhoids 11/05/2014  . Disease of spleen 11/05/2014  . Perennial allergic  rhinitis 11/05/2014  . Obesity (BMI 30-39.9) 11/05/2014  . Hemorrhage of rectum and anus 11/05/2014  . Rotator cuff syndrome 11/05/2014  . Leg varices 11/05/2014  . Vitamin D deficiency 11/05/2014  . DJD of shoulder 10/16/2014  . DDD (degenerative disc disease), lumbosacral 09/18/2014  . DDD (degenerative disc disease), cervical 09/18/2014  . DJD (degenerative joint disease) 09/18/2014  . Neuropathy due to secondary diabetes ([place]) 09/18/2014  . Diabetes mellitus type 2, uncontrolled ([place]) 07/14/2014  . Long term current use of insulin ([place]) 07/14/2014  . Arthritis, degenerative 09/30/2013  . Arthritis or polyarthritis, rheumatoid ([place]) 09/30/2013    Past Surgical History:  Procedure Laterality Date  . ABDOMINAL HYSTERECTOMY     partial  . BACK SURGERY    . CHOLECYSTECTOMY    . COLONOSCOPY WITH PROPOFOL N/A 08/18/2015   Procedure: COLONOSCOPY WITH PROPOFOL;  Surgeon: Manya Silvas, MD;  Location: [facility] ENDOSCOPY;  Service: Endoscopy;  Laterality: N/A;  . ECTOPIC PREGNANCY SURGERY    . ESOPHAGOGASTRODUODENOSCOPY (EGD) WITH PROPOFOL N/A 08/18/2015   Procedure: ESOPHAGOGASTRODUODENOSCOPY (EGD) WITH PROPOFOL;  Surgeon: Manya Silvas, MD;  Location: [facility] ENDOSCOPY;  Service: Endoscopy;  Laterality: N/A;  . FOOT SURGERY    . NECK SURGERY    . SHOULDER SURGERY Right     Prior to Admission medications   Medication Sig Start Date End Date Taking? Authorizing Provider  allopurinol (ZYLOPRIM) 100 MG tablet Take 1 tablet (100 mg total) by mouth daily. Patient not taking: Reported on  11/11/2015 12/02/14   Steele Sizer, MD  amLODipine (NORVASC) 10 MG tablet TAKE 1 TABLET(10 MG TOTAL) BY MOUTH DAILY. 11/08/15   Steele Sizer, MD  aspirin 81 MG tablet Take 81 mg by mouth daily.    [provider]  B-12, METHYLCOBALAMIN, SL Place 5,000 mcg under the tongue daily.    [provider]  BD INSULIN SYRINGE ULTRAFINE 31G X 15/64" 1 ML MISC  09/24/14   [provider]    dicyclomine (BENTYL) 20 MG tablet Take 1 tablet (20 mg total) by mouth 3 (three) times daily as needed. Patient not taking: Reported on 01/03/2016 07/19/15   Orbie Pyo, MD  ergocalciferol (VITAMIN D2) 50000 UNITS capsule Take 1 capsule (50,000 Units total) by mouth once a week. Patient not taking: Reported on 11/11/2015 12/02/14   Steele Sizer, MD  ergocalciferol (VITAMIN D2) 50000 UNITS capsule Take 50,000 Units by mouth once a week. Reported on 11/11/2015    [provider]  fluticasone (FLONASE) 50 MCG/ACT nasal spray Place 2 sprays into both nostrils as needed for allergies. 11/06/14   Steele Sizer, MD  gabapentin (NEURONTIN) 300 MG capsule Limit 1-2 tabs by mouth 2-3 times per day if tolerated 01/03/16   Mohammed Kindle, MD  glipiZIDE (GLUCOTROL) 10 MG tablet Take 5 mg by mouth 2 (two) times daily before a meal.     [provider]  GLUCOSE BLOOD VI  04/21/11   [provider]  HYDROcodone-acetaminophen (NORCO/VICODIN) 5-325 MG tablet Limit 1 tab by mouth per day or twice a day if tolerated 01/03/16   Mohammed Kindle, MD  hydroxychloroquine (PLAQUENIL) 200 MG tablet Take 200 mg by mouth 2 (two) times daily.     [provider]  insulin lispro (HUMALOG) 100 UNIT/ML injection Inject 65 Units into the skin 2 (two) times daily.    [provider]  insulin NPH Human (HUMULIN N,NOVOLIN N) 100 UNIT/ML injection Inject 85 Units into the skin 2 (two) times daily before a meal. 9 am and 9 pm    [provider]  metFORMIN (GLUCOPHAGE) 500 MG tablet Take 500 mg by mouth daily.    [provider]  metoprolol succinate (TOPROL-XL) 50 MG 24 hr tablet Take 1 tablet (50 mg total) by mouth daily. Take with or immediately following a meal. 11/06/14   Sowles, Drue Stager, MD  omeprazole (PRILOSEC) 20 MG capsule Take 20 mg by mouth daily. Reported on 11/11/2015    [provider]  pantoprazole (PROTONIX) 20 MG tablet Take 20 mg by mouth 2  (two) times daily.    [provider]  pravastatin (PRAVACHOL) 40 MG tablet TAKE 1 TABLET BY MOUTH EVERY EVENING FOR CHOLESTEROL IN PLACE OF LIPITOR 02/05/15   Sowles, Drue Stager, MD  predniSONE (STERAPRED UNI-PAK 48 TAB) 5 MG (48) TBPK tablet Take 5 mg by mouth daily. Taper dose pack    [provider]  ranitidine (ZANTAC) 150 MG tablet Take 1 tablet (150 mg total) by mouth 2 (two) times daily. Patient not taking: Reported on 01/03/2016 11/20/14   Steele Sizer, MD  sertraline (ZOLOFT) 50 MG tablet Take 50 mg by mouth daily.    [provider]  sucralfate (CARAFATE) 1 G tablet Take 1 tablet by mouth 4 (four) times daily. Reported on 11/11/2015 11/26/14   [provider]  telmisartan-hydrochlorothiazide (MICARDIS HCT) 80-25 MG per tablet Take 1 tablet by mouth daily. 11/06/14   Steele Sizer, MD    Allergies Ace inhibitors and Sulfa antibiotics  Family  History  Problem Relation Age of Onset  . Diabetes Mother   . Diabetes Father   . Kidney disease Father   . Diabetes Sister   . Kidney disease Brother        Dialysis  . Stroke Brother   . Breast cancer Neg Hx     Social History Social History  Substance Use Topics  . Smoking status: Former Smoker    Packs/day: 0.50    Years: 32.00    Types: Cigarettes    Start date: 05/15/1972    Quit date: 09/20/2004  . Smokeless tobacco: Never Used  . Alcohol use No    Review of Systems  Constitutional: No fever/chills Eyes: No visual changes. ENT: No sore throat. Cardiovascular: Denies chest pain. Respiratory: Denies shortness of breath. Gastrointestinal:  No diarrhea.   Genitourinary: Negative for dysuria. Musculoskeletal: Negative for back pain. Skin: Negative for rash. Neurological: Negative for headaches, focal weakness or numbness.   ____________________________________________   PHYSICAL EXAM:  VITAL SIGNS: ED Triage Vitals  Enc Vitals Group     BP --      Pulse Rate 10/29/16 1555 93      Resp 10/29/16 1555 18     Temp 10/29/16 1555 98.3 F (36.8 C)     Temp Source 10/29/16 1555 Oral     SpO2 10/29/16 1555 99 %     Weight 10/29/16 1556 230 lb (104.3 kg)     Height 10/29/16 1556 5\' 6"  (1.676 m)     Head Circumference --      Peak Flow --      Pain Score 10/29/16 1555 10     Pain Loc --      Pain Edu? --      Excl. in [place]? --     Constitutional: Alert and oriented. Patient appears in mild distress with obvious discomfort, holding her upper abdomen. Eyes: Conjunctivae are normal.  Head: Atraumatic. Nose: No congestion/rhinnorhea. Mouth/Throat: Mucous membranes are moist.  Neck: No stridor.   Cardiovascular: Normal rate, regular rhythm. Grossly normal heart sounds.   Respiratory: Normal respiratory effort.  No retractions. Lungs CTAB. Gastrointestinal: Soft with moderate to severe epigastric tenderness to palpation. Moderate right upper quadrant as well as right lower quadrant tenderness to palpation. No left lower quadrant tenderness palpation. No distention. No rigidity. No distention.  Musculoskeletal: No lower extremity tenderness nor edema.  No joint effusions. Neurologic:  Normal speech and language. No gross focal neurologic deficits are appreciated. Skin:  Skin is warm, dry and intact. No rash noted. Psychiatric: Mood and affect are normal. Speech and behavior are normal.  ____________________________________________   LABS (all labs ordered are listed, but only abnormal results are displayed)  Labs Reviewed  COMPREHENSIVE METABOLIC PANEL - Abnormal; Notable for the following:       Result Value   Glucose, Bld 179 (*)    All other components within normal limits  CBC - Abnormal; Notable for the following:    RDW 14.9 (*)    All other components within normal limits  URINALYSIS, COMPLETE (UACMP) WITH MICROSCOPIC - Abnormal; Notable for the following:    Color, Urine YELLOW (*)    APPearance CLEAR (*)    Squamous Epithelial / LPF 0-5 (*)    All other  components within normal limits  TROPONIN I - Abnormal; Notable for the following:    Troponin I 0.03 (*)    All other components within normal limits  LIPASE, BLOOD   ____________________________________________  EKG  ED ECG REPORT I, Doran Stabler, the attending physician, personally viewed and interpreted this ECG.   Date: 10/29/2016  EKG Time: 1909  Rate: 86  Rhythm: normal sinus rhythm  Axis: normal  Intervals:prolonged pr interval int  ST&T Change: no st segment elevation or depression.  No abn t wave inversion.    ____________________________________________  RADIOLOGY  Pancreatitis first duodenitis on CAT scan. ____________________________________________   PROCEDURES  Procedure(s) performed:   Procedures  Critical Care performed:   ____________________________________________   INITIAL IMPRESSION / ASSESSMENT AND PLAN / ED COURSE  Pertinent labs & imaging results that were available during my care of the patient were reviewed by me and considered in my medical decision making (see chart for details).  ----------------------------------------- 1:03 AM on 10/30/2016 -----------------------------------------  Upon reevaluation after 2 doses of morphine the patient is still moderate to severely tender in the upper abdomen. She is appearing uncomfortable and holding her upper abdomen. She will be admitted to the hospital. Signed out to Dr. Jannifer Franklin. She is understanding of this plan and willing to comply. Favor duodenitis in the context of the normal lipase.      ____________________________________________   FINAL CLINICAL IMPRESSION(S) / ED DIAGNOSES  Duodenitis.    NEW MEDICATIONS STARTED DURING THIS VISIT:  New Prescriptions   No medications on file     Note:  This document was prepared using Dragon voice recognition software and may include unintentional dictation errors.     Orbie Pyo, MD 10/30/16 [phone]

## 2016-10-30 LAB — BASIC METABOLIC PANEL
ANION GAP: 6 (ref 5–15)
BUN: 8 mg/dL (ref 6–20)
CHLORIDE: 101 mmol/L (ref 101–111)
CO2: 29 mmol/L (ref 22–32)
Calcium: 9 mg/dL (ref 8.9–10.3)
Creatinine, Ser: 0.82 mg/dL (ref 0.44–1.00)
GFR calc non Af Amer: >60 mL/min (ref >60)
Glucose, Bld: 195 mg/dL — ABNORMAL HIGH (ref 65–99)
POTASSIUM: 3.6 mmol/L (ref 3.5–5.1)
SODIUM: 136 mmol/L (ref 135–145)

## 2016-10-30 LAB — CBC
HEMATOCRIT: 36.4 % (ref 35.0–47.0)
HEMOGLOBIN: 12.1 g/dL (ref 12.0–16.0)
MCH: 27 pg (ref 26.0–34.0)
MCHC: 33.3 g/dL (ref 32.0–36.0)
MCV: 81.2 fL (ref 80.0–100.0)
PLATELETS: 375 10*3/uL (ref 150–440)
RBC: 4.49 MIL/uL (ref 3.80–5.20)
RDW: 14.9 % — ABNORMAL HIGH (ref 11.5–14.5)
WBC: 9.2 10*3/uL (ref 3.6–11.0)

## 2016-10-30 LAB — GLUCOSE, CAPILLARY
Glucose-Capillary: 189 mg/dL — ABNORMAL HIGH (ref 65–99)
Glucose-Capillary: 177 mg/dL — ABNORMAL HIGH (ref 65–99)

## 2016-10-30 MED ORDER — HYDROCHLOROTHIAZIDE 25 MG PO TABS
25.0000 mg | ORAL_TABLET | Freq: Every day | ORAL | Status: DC
  Administered 2016-10-30: 25 mg via ORAL
  Filled 2016-10-30: qty 1

## 2016-10-30 MED ORDER — MORPHINE SULFATE (PF) 2 MG/ML IV SOLN
2.0000 mg | INTRAVENOUS | Status: DC | PRN
  Administered 2016-10-30: 2 mg via INTRAVENOUS
  Filled 2016-10-30: qty 1

## 2016-10-30 MED ORDER — ONDANSETRON HCL 4 MG/2ML IJ SOLN: 4.0000 mg | Freq: Four times a day (QID) | INTRAMUSCULAR | Status: DC | PRN

## 2016-10-30 MED ORDER — AMLODIPINE BESYLATE 10 MG PO TABS
10.0000 mg | ORAL_TABLET | Freq: Every day | ORAL | Status: DC
  Administered 2016-10-30: 10 mg via ORAL
  Filled 2016-10-30: qty 1

## 2016-10-30 MED ORDER — SENNOSIDES-DOCUSATE SODIUM 8.6-50 MG PO TABS: 1.0000 | ORAL_TABLET | Freq: Two times a day (BID) | ORAL | 0 refills | Status: AC

## 2016-10-30 MED ORDER — HYDROXYCHLOROQUINE SULFATE 200 MG PO TABS
200.0000 mg | ORAL_TABLET | Freq: Two times a day (BID) | ORAL | Status: DC
  Administered 2016-10-30 (×2): 200 mg via ORAL
  Filled 2016-10-30 (×2): qty 1

## 2016-10-30 MED ORDER — PRAVASTATIN SODIUM 40 MG PO TABS
40.0000 mg | ORAL_TABLET | Freq: Every day | ORAL | Status: DC
  Administered 2016-10-30: 40 mg via ORAL
  Filled 2016-10-30: qty 2

## 2016-10-30 MED ORDER — INSULIN ASPART 100 UNIT/ML ~~LOC~~ SOLN
0.0000 [IU] | Freq: Three times a day (TID) | SUBCUTANEOUS | Status: DC
  Administered 2016-10-30: 2 [IU] via SUBCUTANEOUS
  Filled 2016-10-30: qty 1

## 2016-10-30 MED ORDER — LOSARTAN POTASSIUM-HCTZ 100-25 MG PO TABS: 1.0000 | ORAL_TABLET | Freq: Every day | ORAL | Status: DC

## 2016-10-30 MED ORDER — METOPROLOL SUCCINATE ER 50 MG PO TB24
50.0000 mg | ORAL_TABLET | Freq: Every day | ORAL | Status: DC
  Filled 2016-10-30: qty 1

## 2016-10-30 MED ORDER — ACETAMINOPHEN 325 MG PO TABS: 650.0000 mg | ORAL_TABLET | Freq: Four times a day (QID) | ORAL | Status: DC | PRN

## 2016-10-30 MED ORDER — ONDANSETRON HCL 4 MG PO TABS: 4.0000 mg | ORAL_TABLET | Freq: Four times a day (QID) | ORAL | Status: DC | PRN

## 2016-10-30 MED ORDER — PANTOPRAZOLE SODIUM 40 MG IV SOLR
40.0000 mg | Freq: Two times a day (BID) | INTRAVENOUS | Status: DC
  Administered 2016-10-30 (×2): 40 mg via INTRAVENOUS
  Filled 2016-10-30 (×2): qty 40

## 2016-10-30 MED ORDER — ACETAMINOPHEN 650 MG RE SUPP: 650.0000 mg | Freq: Four times a day (QID) | RECTAL | Status: DC | PRN

## 2016-10-30 MED ORDER — HYDROCODONE-ACETAMINOPHEN 5-325 MG PO TABS: ORAL_TABLET | ORAL | 0 refills | Status: DC

## 2016-10-30 MED ORDER — SODIUM CHLORIDE 0.9 % IV SOLN
INTRAVENOUS | Status: AC
  Administered 2016-10-30: 02:00:00 via INTRAVENOUS

## 2016-10-30 MED ORDER — POLYETHYLENE GLYCOL 3350 17 G PO PACK: 17.0000 g | PACK | Freq: Every day | ORAL | 0 refills | Status: AC

## 2016-10-30 MED ORDER — ASPIRIN 81 MG PO CHEW
81.0000 mg | CHEWABLE_TABLET | Freq: Every day | ORAL | Status: DC
  Administered 2016-10-30: 81 mg via ORAL
  Filled 2016-10-30: qty 1

## 2016-10-30 MED ORDER — ENOXAPARIN SODIUM 40 MG/0.4ML ~~LOC~~ SOLN: 40.0000 mg | SUBCUTANEOUS | Status: DC

## 2016-10-30 MED ORDER — SUCRALFATE 1 G PO TABS
1.0000 g | ORAL_TABLET | Freq: Four times a day (QID) | ORAL | Status: DC
  Administered 2016-10-30: 1 g via ORAL
  Filled 2016-10-30: qty 1

## 2016-10-30 MED ORDER — LOSARTAN POTASSIUM 50 MG PO TABS
100.0000 mg | ORAL_TABLET | Freq: Every day | ORAL | Status: DC
  Administered 2016-10-30: 100 mg via ORAL
  Filled 2016-10-30: qty 2

## 2016-10-30 MED ORDER — INSULIN ASPART 100 UNIT/ML ~~LOC~~ SOLN: 0.0000 [IU] | Freq: Every day | SUBCUTANEOUS | Status: DC

## 2016-10-30 MED ORDER — SERTRALINE HCL 50 MG PO TABS
50.0000 mg | ORAL_TABLET | Freq: Every day | ORAL | Status: DC
  Administered 2016-10-30: 50 mg via ORAL
  Filled 2016-10-30: qty 1

## 2016-10-30 NOTE — Progress Notes (Signed)
Discharge instructions reviewed with patient. Patient educated on medication regimen and follow up appointments. Patient has left message for Dr. Percell [place] office to follow up. IV removed without complications.

## 2016-10-30 NOTE — Discharge Instructions (Signed)
Duodenitis °Duodenitis is inflammation of the lining of the first part of the small intestine (duodenum). It is commonly caused by a bacterial infection, which may also lead to open sores (ulcers) in the intestine. °Duodenitis may develop suddenly and last for a short time (acute) or it may develop gradually and last for months or years (chronic). °What are the causes? °The most common cause of duodenitis is an infection from H. pylori bacteria. Other causes of this condition include: °· Long-term use of NSAIDs. °· Excessive use of alcohol. °· An infection of the small intestine (giardiasis). °· Crohn’s disease. °· Certain diseases of the immune system. °· Certain treatments for cancer. ° °What increases the risk? ° °The following factors may make you more likely to develop duodenitis: °· Smoking cigarettes. °· Drinking alcohol. °· Having a family history of duodenitis. °· Taking NSAIDs. °· Eating a high-fat diet. ° °What are the signs or symptoms? °Symptoms of this condition may include: °· Gnawing or burning pain in the upper center of the abdomen (epigastric pain). This may get worse when the stomach is empty and may get better after eating. °· Abdominal cramps. °· Nausea and vomiting. °· Bloody vomit. °· Stools that are bloody, dark, or look like tar. °· Diarrhea. °· Weight loss. °· Fatigue. ° °How is this diagnosed? °This condition may be diagnosed based on your medical history and a physical exam. You may also have tests, such as: °· Blood tests. °· Stool tests. °· A test that checks the gases in your breath. °· An X-ray that is done after you swallow a liquid (barium) that makes your digestive tract easier to see. °· Endoscopy. This is an exam of the duodenum that is done by putting a thin tube with a tiny camera on the end down your throat (endoscopy). A sample of tissue from your duodenum (biopsy) may be removed with the endoscope and examined under a microscope for signs of inflammation and  infection. ° °How is this treated? °Treatment depends on the cause of your condition. Treatment may include: °· Antibiotic medicine to treat H. pylori infection. °· Stopping your intake of NSAIDs. °· Medicine to reduce stomach acids. °· Medicine to treat Crohn’s disease. °· Surgery to treat severe inflammation that causes scarring or severe bleeding. ° °Follow these instructions at home: °Medicines °· Take over-the-counter and prescription medicines only as told by your health care provider. °· If you were prescribed antibiotic medicine, take it as told by your health care provider. Do not stop taking the antibiotic even if you start to feel better. °Eating and drinking ° °· Eat small, frequent meals. °· Do not drink alcohol. °· Drink enough water to keep your urine clear or pale yellow. °· Follow instructions from your health care provider about eating or drinking restrictions. You may be asked to avoid: °? Caffeinated drinks. °? Chocolate. °? Peppermint or mint-flavored food or drinks. °? Garlic. °? Onions. °? Spicy foods. °? Citrus fruits. °? Tomato-based foods. °? Fatty foods. °? Fried foods. °Contact a health care provider if: °· You have a fever. °· Your symptoms come back, get worse, or do not get better with treatment. °Get help right away if: °· You vomit blood. °· You have severe abdominal pain. °· Your abdomen swells and is painful. °· You have a lot of blood in your stool. °· You feel dizzy or light-headed. °This information is not intended to replace advice given to you by your health care provider. Make sure you discuss   any questions you have with your health care provider. °Document Released: 08/26/2012 Document Revised: 10/07/2015 Document Reviewed: 03/04/2015 °Elsevier Interactive Patient Education © 2018 [employer] ° °

## 2016-10-30 NOTE — Care Management Obs Status (Signed)
[place] NOTIFICATION   Patient Details  Name: Charlotte [name] MRN: [id number] Date of Birth: [birth year]   Medicare Observation Status Notification Given:  Yes    Jolly Mango, RN 10/30/2016, 10:58 AM

## 2016-10-30 NOTE — Discharge Summary (Signed)
[facility] [place] [facility] [place], 61 y.o., DOB June 15, 1955, MRN [id number]. Admission date: 10/29/2016 Discharge Date 10/30/2016 Primary MD Hortencia Pilar, MD Admitting Physician Lance Coon, MD  Admission Diagnosis  Duodenitis [K29.80]  Discharge Diagnosis   Principal Problem:   Duodenitis    Diabetes mellitus type 2, uncontrolled ([place])   Benign essential HTN   Dyslipidemia  Irritable bowel syndrome        Hospital Course Charlotte [name]  is a 61 y.o. female who presents with Significant epigastric abdominal pain. Patient states that she's had this pain for about 2 weeks now. She went to see her outpatient gastroenterologist, who placed her on PPI and sucralfate. Her pain got significantly worse and she came to the ED tonight for evaluation. CT scan here shows duodenitis versus pancreatitis. Given her lipase was normal, duodenitis as the favored differential. Patient was admitted and treated with IV Protonix. GI consult was placed our patient states that she is feeling much better and since she is admitted under observation she would like to go home. She does follow up with [facility] and she will need to follow-up and likely will need a EGD.  Central continue Protonix twice a day as well as Carafate.             Consults  None  Significant Tests:  See full reports for all details     US Abdomen Complete  Result Date: 10/20/2016 CLINICAL DATA:  Nonalcoholic hepatic steatosis.  Epigastric pain EXAM: ABDOMEN ULTRASOUND COMPLETE COMPARISON:  09/23/2015 FINDINGS: Gallbladder: Prior cholecystectomy Common bile duct: Diameter: Normal caliber for post cholecystectomy state, 7 mm Liver: Increased echotexture compatible with fatty infiltration. No focal abnormality or biliary ductal dilatation. IVC: No abnormality visualized. Pancreas: Visualized portion unremarkable. Spleen: Size and appearance within normal limits. Right Kidney: Length: 11.8 cm.  Echogenicity within normal limits. No mass or hydronephrosis visualized. Left Kidney: Length: 11.0 cm. Echogenicity within normal limits. No mass or hydronephrosis visualized. Abdominal aorta: No aneurysm visualized. Other findings: None. IMPRESSION: Fatty infiltration of the liver. No acute findings. Electronically Signed   By: Rolm Baptise M.D.   On: 10/20/2016 08:51   Ct Abdomen Pelvis W Contrast  Result Date: 10/29/2016 CLINICAL DATA:  61 y/o  F; abdominal pain with nausea and vomiting. EXAM: CT ABDOMEN AND PELVIS WITH CONTRAST TECHNIQUE: Multidetector CT imaging of the abdomen and pelvis was performed using the standard protocol following bolus administration of intravenous contrast. CONTRAST:  118mL ISOVUE-300 IOPAMIDOL (ISOVUE-300) INJECTION 61% COMPARISON:  07/25/2016 CT of the abdomen and pelvis. FINDINGS: Lower chest: No acute abnormality. Hepatobiliary: Hepatic steatosis and mild hepatomegaly. Status post cholecystectomy. Prominence of the common bile duct is likely compensatory post cholecystectomy. Pancreas: There is mild edema surrounding the head of pancreas and along the course of the second and third portions of the duodenum. No acute peripancreatic collection or evidence for pancreatic necrosis. Spleen: Normal in size without focal abnormality. Adrenals/Urinary Tract: Adrenal glands are unremarkable. Kidneys are normal, without renal calculi, focal lesion, or hydronephrosis. Bladder is unremarkable. Stomach/Bowel: Stomach is within normal limits. Appendix appears normal. No evidence of bowel wall thickening, distention, or inflammatory changes. Extensive sigmoid diverticulosis without evidence for acute diverticulitis. Vascular/Lymphatic: Aortic atherosclerosis with minimal calcification. No enlarged abdominal or pelvic lymph nodes. Reproductive: Status post hysterectomy. No adnexal masses. Other: No abdominal wall hernia or abnormality. No abdominopelvic ascites. Musculoskeletal: No acute or  significant osseous findings. Advanced lumbar degenerative changes greatest at the L4-5  and L5-S1 levels where there is multifactorial at least moderate canal stenosis. Status post L4-5 laminectomy. IMPRESSION: 1. Edema surrounding the head of pancreas, along course of second and third portions of duodenum, and duodenum wall thickening. Differential includes acute duodenitis or pancreatitis. No discrete abscess or evidence for bowel perforation. 2. Stable hepatic steatosis and hepatomegaly. 3. Stable extensive sigmoid diverticulosis without evidence for acute diverticulitis. 4. Stable lumbar degenerative changes greatest at L3-4 and L4-5 where there is multifactorial at least moderate canal stenosis. Electronically Signed   By: Kristine Garbe M.D.   On: 10/29/2016 21:16       Today   Subjective:   Charlotte [name]  feels much better abdominal pain now improved Objective:   Blood pressure (!) 161/68, pulse 82, temperature 98.3 F (36.8 C), temperature source Oral, resp. rate 18, height 5\' 6"  (1.676 m), weight 230 lb (104.3 kg), SpO2 100 %.  .  Intake/Output Summary (Last 24 hours) at 10/30/16 1334 Last data filed at 10/30/16 1023  Gross per 24 hour  Intake              780 ml  Output              300 ml  Net              480 ml    Exam VITAL SIGNS: Blood pressure (!) 161/68, pulse 82, temperature 98.3 F (36.8 C), temperature source Oral, resp. rate 18, height 5\' 6"  (1.676 m), weight 230 lb (104.3 kg), SpO2 100 %.  GENERAL:  61 y.o.-year-old patient lying in the bed with no acute distress.  EYES: Pupils equal, round, reactive to light and accommodation. No scleral icterus. Extraocular muscles intact.  HEENT: Head atraumatic, normocephalic. Oropharynx and nasopharynx clear.  NECK:  Supple, no jugular venous distention. No thyroid enlargement, no tenderness.  LUNGS: Normal breath sounds bilaterally, no wheezing, rales,rhonchi or crepitation. No use of accessory muscles of  respiration.  CARDIOVASCULAR: S1, S2 normal. No murmurs, rubs, or gallops.  ABDOMEN: Soft, nontender, nondistended. Bowel sounds present. No organomegaly or mass.  EXTREMITIES: No pedal edema, cyanosis, or clubbing.  NEUROLOGIC: Cranial nerves II through XII are intact. Muscle strength 5/5 in all extremities. Sensation intact. Gait not checked.  PSYCHIATRIC: The patient is alert and oriented x 3.  SKIN: No obvious rash, lesion, or ulcer.   Data Review     CBC w Diff: Lab Results  Component Value Date   WBC 9.2 10/30/2016   HGB 12.1 10/30/2016   HGB 11.7 11/20/2014   HCT 36.4 10/30/2016   HCT 36.3 11/20/2014   PLT 375 10/30/2016   PLT 399 (H) 11/20/2014   LYMPHOPCT 35 03/14/2016   LYMPHOPCT 46.0 10/13/2011   MONOPCT 7 03/14/2016   MONOPCT 8.0 10/13/2011   EOSPCT 1 03/14/2016   EOSPCT 1.0 10/13/2011   BASOPCT 1 03/14/2016   BASOPCT 0.4 10/13/2011   CMP: Lab Results  Component Value Date   NA 136 10/30/2016   NA 141 11/20/2014   NA 135 (L) 07/26/2012   K 3.6 10/30/2016   K 3.3 (L) 07/26/2012   CL 101 10/30/2016   CL 99 07/26/2012   CO2 29 10/30/2016   CO2 29 07/26/2012   BUN 8 10/30/2016   BUN 11 11/20/2014   BUN 11 07/26/2012   CREATININE 0.82 10/30/2016   CREATININE 1.05 07/26/2012   PROT 8.1 10/29/2016   PROT 7.3 11/20/2014   PROT 8.2 07/26/2012   ALBUMIN 4.1 10/29/2016   ALBUMIN  4.2 11/20/2014   ALBUMIN 3.8 07/26/2012   BILITOT 0.5 10/29/2016   BILITOT 0.3 11/20/2014   BILITOT 0.7 07/26/2012   ALKPHOS 73 10/29/2016   ALKPHOS 88 07/26/2012   AST 20 10/29/2016   AST 31 07/26/2012   ALT 15 10/29/2016   ALT 34 07/26/2012  .  Micro Results No results found for this or any previous visit (from the past 240 hour(s)).      Code Status Orders        Start     Ordered   10/30/16 0057  Full code  Continuous     10/30/16 0056    Code Status History    Date Active Date Inactive Code Status Order ID Comments User Context   This patient has a current  code status but no historical code status.    Advance Directive Documentation     Most Recent Value  Type of Advance Directive  Living will  Pre-existing out of facility DNR order (yellow form or pink MOST form)  -  "MOST" Form in Place?  -          Follow-up Information    Hortencia Pilar, MD On 11/06/2016.   Specialty:  Family Medicine Why:  AT [address] information: [place] [address] [phone]        Manya Silvas, MD Follow up in 2 day(s).   Specialty:  Gastroenterology Why:  1-2 days asap likely need egd Contact information: [place] [address] [phone]           Discharge Medications   Allergies as of 10/30/2016      Reactions   Ace Inhibitors    Other reaction(s): Unknown Pt can't remember what her reaction is and she can't remember what the name of the BP med    Sulfa Antibiotics Rash      Medication List    STOP taking these medications   aspirin 81 MG tablet     TAKE these medications   allopurinol 100 MG tablet Commonly known as:  ZYLOPRIM Take 1 tablet (100 mg total) by mouth daily.   amLODipine 10 MG tablet Commonly known as:  NORVASC TAKE 1 TABLET(10 MG TOTAL) BY MOUTH DAILY.   dicyclomine 20 MG tablet Commonly known as:  BENTYL Take 1 tablet (20 mg total) by mouth 3 (three) times daily as needed.   ergocalciferol 50000 units capsule Commonly known as:  VITAMIN D2 Take 1 capsule (50,000 Units total) by mouth once a week.   fluticasone 50 MCG/ACT nasal spray Commonly known as:  FLONASE Place 2 sprays into both nostrils as needed for allergies.   gabapentin 300 MG capsule Commonly known as:  NEURONTIN Limit 1-2 tabs by mouth 2-3 times per day if tolerated   glipiZIDE 10 MG tablet Commonly known as:  GLUCOTROL Take 10 mg by mouth daily before breakfast.   HYDROcodone-acetaminophen 5-325 MG tablet Commonly known as:  NORCO/VICODIN Limit 1 tab by mouth per day or twice a  day if tolerated   hydroxychloroquine 200 MG tablet Commonly known as:  PLAQUENIL Take 200 mg by mouth 2 (two) times daily.   insulin lispro 100 UNIT/ML injection Commonly known as:  HUMALOG Inject 65 Units into the skin daily. In middle of day   insulin NPH Human 100 UNIT/ML injection Commonly known as:  HUMULIN N,NOVOLIN N Inject 85 Units into the skin 2 (two) times daily before a meal. 9 am and 9 pm  losartan-hydrochlorothiazide 100-25 MG tablet Commonly known as:  HYZAAR Take 1 tablet by mouth daily.   metFORMIN 500 MG 24 hr tablet Commonly known as:  GLUCOPHAGE-XR Take 500 mg by mouth daily.   metoprolol succinate 50 MG 24 hr tablet Commonly known as:  TOPROL-XL Take 1 tablet (50 mg total) by mouth daily. Take with or immediately following a meal.   pantoprazole 40 MG tablet Commonly known as:  PROTONIX Take 40 mg by mouth 2 (two) times daily.   polyethylene glycol packet Commonly known as:  MIRALAX Take 17 g by mouth daily.   pravastatin 40 MG tablet Commonly known as:  PRAVACHOL TAKE 1 TABLET BY MOUTH EVERY EVENING FOR CHOLESTEROL IN PLACE OF LIPITOR   ranitidine 150 MG tablet Commonly known as:  ZANTAC Take 1 tablet (150 mg total) by mouth 2 (two) times daily.   senna-docusate 8.6-50 MG tablet Commonly known as:  Senokot-S Take 1 tablet by mouth 2 (two) times daily.   sertraline 50 MG tablet Commonly known as:  ZOLOFT Take 50 mg by mouth daily.   sucralfate 1 g tablet Commonly known as:  CARAFATE Take 1 tablet by mouth 4 (four) times daily. Reported on 11/11/2015   telmisartan-hydrochlorothiazide 80-25 MG tablet Commonly known as:  MICARDIS HCT Take 1 tablet by mouth daily.          Total Time in preparing paper work, data evaluation and todays exam - 35 minutes  Dustin Flock M.D on 10/30/2016 at 1:34 PM  [facility]   Office  [phone]

## 2016-10-31 LAB — HIV ANTIBODY (ROUTINE TESTING W REFLEX): HIV SCREEN 4TH GENERATION: NONREACTIVE

## 2016-11-14 ENCOUNTER — Other Ambulatory Visit: Payer: Self-pay | Admitting: Family Medicine

## 2016-11-14 DIAGNOSIS — Z1231 Encounter for screening mammogram for malignant neoplasm of breast: Secondary | ICD-10-CM

## 2016-12-18 ENCOUNTER — Ambulatory Visit
Admission: RE | Admit: 2016-12-18 | Discharge: 2016-12-18 | Disposition: A | Discharge status: Home / Self Care | Payer: Medicare Other | Source: Ambulatory Visit | Attending: Family Medicine | Admitting: Family Medicine

## 2016-12-18 DIAGNOSIS — Z1231 Encounter for screening mammogram for malignant neoplasm of breast: Secondary | ICD-10-CM | POA: Diagnosis present

## 2017-04-19 ENCOUNTER — Other Ambulatory Visit: Payer: Self-pay | Admitting: Student

## 2017-04-19 DIAGNOSIS — K760 Fatty (change of) liver, not elsewhere classified: Secondary | ICD-10-CM

## 2017-05-04 ENCOUNTER — Ambulatory Visit
Admission: RE | Admit: 2017-05-04 | Discharge: 2017-05-04 | Disposition: A | Discharge status: Home / Self Care | Payer: Medicare Other | Source: Ambulatory Visit | Attending: Student | Admitting: Student

## 2017-05-04 DIAGNOSIS — Z9049 Acquired absence of other specified parts of digestive tract: Secondary | ICD-10-CM | POA: Insufficient documentation

## 2017-05-04 DIAGNOSIS — K760 Fatty (change of) liver, not elsewhere classified: Secondary | ICD-10-CM | POA: Insufficient documentation

## 2017-10-29 ENCOUNTER — Other Ambulatory Visit
Admission: RE | Admit: 2017-10-29 | Discharge: 2017-10-29 | Disposition: A | Discharge status: Home / Self Care | Payer: Medicare Other | Source: Ambulatory Visit | Attending: Student | Admitting: Student

## 2017-10-29 DIAGNOSIS — R197 Diarrhea, unspecified: Secondary | ICD-10-CM | POA: Diagnosis present

## 2017-10-29 LAB — GASTROINTESTINAL PANEL BY PCR, STOOL (REPLACES STOOL CULTURE)

## 2017-10-29 LAB — C DIFFICILE QUICK SCREEN W PCR REFLEX
C Diff antigen: NEGATIVE
C Diff interpretation: NOT DETECTED
C Diff toxin: NEGATIVE

## 2017-11-02 ENCOUNTER — Emergency Department: Payer: Medicare Other

## 2017-11-02 ENCOUNTER — Emergency Department
Admission: EM | Admit: 2017-11-02 | Discharge: 2017-11-05 | Disposition: A | Discharge status: Home / Self Care | Payer: Medicare Other | Attending: Emergency Medicine | Admitting: Emergency Medicine

## 2017-11-02 ENCOUNTER — Encounter: Payer: Self-pay | Admitting: Emergency Medicine

## 2017-11-02 DIAGNOSIS — M25561 Pain in right knee: Secondary | ICD-10-CM

## 2017-11-02 DIAGNOSIS — Z87891 Personal history of nicotine dependence: Secondary | ICD-10-CM | POA: Diagnosis not present

## 2017-11-02 DIAGNOSIS — Z79899 Other long term (current) drug therapy: Secondary | ICD-10-CM | POA: Diagnosis not present

## 2017-11-02 DIAGNOSIS — R2241 Localized swelling, mass and lump, right lower limb: Secondary | ICD-10-CM | POA: Diagnosis not present

## 2017-11-02 DIAGNOSIS — E1140 Type 2 diabetes mellitus with diabetic neuropathy, unspecified: Secondary | ICD-10-CM | POA: Insufficient documentation

## 2017-11-02 DIAGNOSIS — I10 Essential (primary) hypertension: Secondary | ICD-10-CM | POA: Insufficient documentation

## 2017-11-02 DIAGNOSIS — Z794 Long term (current) use of insulin: Secondary | ICD-10-CM | POA: Insufficient documentation

## 2017-11-02 NOTE — ED Triage Notes (Signed)
Pt states she started bothering her 3 weeks ago but tonight she is unable to tolerate the pain.  She has been taking Aleve with no relief.  Pt states she is on pain management for old spinal surgeries she has chronic pain for.  She states it hurts when weight is on it, and feels like a "shocking pain" going up her leg.  She states she has sciatic pain from her old spinal surgery as well.

## 2017-11-03 ENCOUNTER — Emergency Department: Payer: Medicare Other

## 2017-11-03 NOTE — ED Notes (Signed)
ED Provider at bedside. 

## 2017-11-03 NOTE — ED Provider Notes (Signed)
[facility] Emergency Department Provider Note  ____________________________________________   First MD Initiated Contact with Patient 11/02/17 2359     (approximate)  I have reviewed the triage vital signs and the nursing notes.   HISTORY  Chief Complaint Knee Pain (Right Knee) and Leg Swelling (Right Leg)    HPI Charlotte [name] is a 62 y.o. female with medical history as listed below which notably includes prior spinal surgeries and some orthopedic issues with her joints as well as rheumatoid arthritis for which Dr. Jefm Bryant has her on Plaquenil.  She presents for gradually worsening pain in her right knee over the last 3 weeks.  She has had problems with that knee in the past but it has become very sharp and making it difficult for her to walk.  She had a fall and landed on the knee 3 to 4 months ago but no recent trauma.  As of today the acute issue is that she had some swelling in the leg from the behind the knee down including the calf.  She went to [facility] and was recommended she come to the emergency department to make sure she does not have a blood clot.  She has no history of blood clots in the legs of the lungs and has had no recent immobilizations or surgeries.  She denies fever/chills, chest pain, shortness of breath, nausea, vomiting, and abdominal pain.  She has some chronic back pain and goes to a pain clinic and her back is not bothering  Her anymore than usual.  She reports that the pain in her leg is severe, weightbearing and ambulation make it worse, nothing particular makes it better.   Past Medical History:  Diagnosis Date  . DDD (degenerative disc disease), lumbar   . Depression   . Diabetes mellitus without complication ([place])   . Diverticulitis   . Fatty liver   . Fibroid tumor   . GERD (gastroesophageal reflux disease)   . Gout   . H pylori ulcer   . Hemorrhoids   . Hypercholesteremia   . Hypertension   . IBS (irritable  bowel syndrome)   . Spinal stenosis     Patient Active Problem List   Diagnosis Date Noted  . Duodenitis 10/29/2016  . Elevated troponin 10/29/2016  . Elevated uric acid in blood 12/02/2014  . B12 deficiency 11/05/2014  . Benign essential HTN 11/05/2014  . Cramps of lower extremity 11/05/2014  . Chronic LBP 11/05/2014  . Chronic bronchitis ([place]) 11/05/2014  . Cystitis 11/05/2014  . Diverticulosis of colon 11/05/2014  . Dyslipidemia 11/05/2014  . Elevated erythrocyte sedimentation rate 11/05/2014  . Gastro-esophageal reflux disease without esophagitis 11/05/2014  . Bleeding internal hemorrhoids 11/05/2014  . Disease of spleen 11/05/2014  . Perennial allergic rhinitis 11/05/2014  . Obesity (BMI 30-39.9) 11/05/2014  . Hemorrhage of rectum and anus 11/05/2014  . Rotator cuff syndrome 11/05/2014  . Leg varices 11/05/2014  . Vitamin D deficiency 11/05/2014  . DJD of shoulder 10/16/2014  . DDD (degenerative disc disease), lumbosacral 09/18/2014  . DDD (degenerative disc disease), cervical 09/18/2014  . DJD (degenerative joint disease) 09/18/2014  . Neuropathy due to secondary diabetes ([place]) 09/18/2014  . Diabetes mellitus type 2, uncontrolled ([place]) 07/14/2014  . Long term current use of insulin ([place]) 07/14/2014  . Arthritis, degenerative 09/30/2013  . Arthritis or polyarthritis, rheumatoid ([place]) 09/30/2013    Past Surgical History:  Procedure Laterality Date  . ABDOMINAL HYSTERECTOMY     partial  .  BACK SURGERY    . CHOLECYSTECTOMY    . COLONOSCOPY WITH PROPOFOL N/A 08/18/2015   Procedure: COLONOSCOPY WITH PROPOFOL;  Surgeon: Manya Silvas, MD;  Location: [facility] ENDOSCOPY;  Service: Endoscopy;  Laterality: N/A;  . ECTOPIC PREGNANCY SURGERY    . ESOPHAGOGASTRODUODENOSCOPY (EGD) WITH PROPOFOL N/A 08/18/2015   Procedure: ESOPHAGOGASTRODUODENOSCOPY (EGD) WITH PROPOFOL;  Surgeon: Manya Silvas, MD;  Location: [facility] ENDOSCOPY;  Service: Endoscopy;  Laterality: N/A;  . FOOT SURGERY     . NECK SURGERY    . SHOULDER SURGERY Right     Prior to Admission medications   Medication Sig Start Date End Date Taking? Authorizing Provider  allopurinol (ZYLOPRIM) 100 MG tablet Take 1 tablet (100 mg total) by mouth daily. Patient not taking: Reported on 11/11/2015 12/02/14   Steele Sizer, MD  amLODipine (NORVASC) 10 MG tablet TAKE 1 TABLET(10 MG TOTAL) BY MOUTH DAILY. 11/08/15   Steele Sizer, MD  dicyclomine (BENTYL) 20 MG tablet Take 1 tablet (20 mg total) by mouth 3 (three) times daily as needed. Patient not taking: Reported on 01/03/2016 07/19/15   Orbie Pyo, MD  ergocalciferol (VITAMIN D2) 50000 UNITS capsule Take 1 capsule (50,000 Units total) by mouth once a week. Patient not taking: Reported on 11/11/2015 12/02/14   Steele Sizer, MD  fluticasone [facility]) 50 MCG/ACT nasal spray Place 2 sprays into both nostrils as needed for allergies. 11/06/14   Steele Sizer, MD  gabapentin (NEURONTIN) 300 MG capsule Limit 1-2 tabs by mouth 2-3 times per day if tolerated 01/03/16   Mohammed Kindle, MD  glipiZIDE (GLUCOTROL) 10 MG tablet Take 10 mg by mouth daily before breakfast.     [provider]  HYDROcodone-acetaminophen (NORCO/VICODIN) 5-325 MG tablet Limit 1 tab by mouth per day or twice a day if tolerated 10/30/16   Dustin Flock, MD  hydroxychloroquine (PLAQUENIL) 200 MG tablet Take 200 mg by mouth 2 (two) times daily.     [provider]  insulin lispro (HUMALOG) 100 UNIT/ML injection Inject 65 Units into the skin daily. In middle of day    [provider]  insulin NPH Human (HUMULIN N,NOVOLIN N) 100 UNIT/ML injection Inject 85 Units into the skin 2 (two) times daily before a meal. 9 am and 9 pm    [provider]  losartan-hydrochlorothiazide (HYZAAR) 100-25 MG tablet Take 1 tablet by mouth daily. 10/14/16   [provider]  metFORMIN (GLUCOPHAGE-XR) 500 MG 24 hr tablet Take 500 mg by mouth daily. 09/05/16   [provider]  metoprolol succinate (TOPROL-XL) 50 MG 24 hr tablet Take 1 tablet (50 mg total) by mouth daily. Take with or immediately following a meal. 11/06/14   Ancil Boozer, Drue Stager, MD  pantoprazole (PROTONIX) 40 MG tablet Take 40 mg by mouth 2 (two) times daily. 10/17/16   [provider]  polyethylene glycol (MIRALAX) packet Take 17 g by mouth daily. 10/30/16   Dustin Flock, MD  pravastatin (PRAVACHOL) 40 MG tablet TAKE 1 TABLET BY MOUTH EVERY EVENING FOR CHOLESTEROL IN PLACE OF LIPITOR 02/05/15   Steele Sizer, MD  ranitidine (ZANTAC) 150 MG tablet Take 1 tablet (150 mg total) by mouth 2 (two) times daily. Patient not taking: Reported on 01/03/2016 11/20/14   Steele Sizer, MD  senna-docusate (SENOKOT-S) 8.6-50 MG tablet Take 1 tablet by mouth 2 (two) times daily. 10/30/16   Dustin Flock, MD  sertraline (ZOLOFT) 50 MG tablet Take 50 mg by mouth daily.    [provider]  sucralfate ([place])  1 G tablet Take 1 tablet by mouth 4 (four) times daily. Reported on 11/11/2015 11/26/14   [provider]  telmisartan-hydrochlorothiazide (MICARDIS HCT) 80-25 MG per tablet Take 1 tablet by mouth daily. Patient not taking: Reported on 10/29/2016 11/06/14   Steele Sizer, MD    Allergies Ace inhibitors and Sulfa antibiotics  Family History  Problem Relation Age of Onset  . Diabetes Mother   . Diabetes Father   . Kidney disease Father   . Diabetes Sister   . Kidney disease Brother        Dialysis  . Stroke Brother   . Breast cancer Neg Hx     Social History Social History   Tobacco Use  . Smoking status: Former Smoker    Packs/day: 0.50    Years: 32.00    Pack years: 16.00    Types: Cigarettes    Start date: 05/15/1972    Last attempt to quit: 09/20/2004    Years since quitting: 13.1  . Smokeless tobacco: Never Used  Substance Use Topics  . Alcohol use: No    Alcohol/week: 0.0 oz  . Drug use: No    Review of Systems Constitutional: No fever/chills Eyes:  No visual changes. ENT: No sore throat. Cardiovascular: Denies chest pain. Respiratory: Denies shortness of breath. Gastrointestinal: No abdominal pain.  No nausea, no vomiting.  No diarrhea.  No constipation. Genitourinary: Negative for dysuria. Musculoskeletal: Pain in her right knee gradually worsening over about 3 weeks but acutely worse behind the knee with some swelling to the right lower extremity as described above Integumentary: Negative for rash. Neurological: Negative for headaches, focal weakness or numbness.   ____________________________________________   PHYSICAL EXAM:  VITAL SIGNS: ED Triage Vitals [11/02/17 2033]  Enc Vitals Group     BP (!) 158/80     Pulse Rate 97     Resp 18     Temp 97.9 F (36.6 C)     Temp Source Oral     SpO2 98 %     Weight 104.8 kg (231 lb)     Height 1.676 m (5\' 6" )     Head Circumference      Peak Flow      Pain Score 10     Pain Loc      Pain Edu?      Excl. in [place]?     Constitutional: Alert and oriented. Well appearing and in no acute distress. Eyes: Conjunctivae are normal.  Head: Atraumatic. Nose: No congestion/rhinnorhea. Mouth/Throat: Mucous membranes are moist. Neck: No stridor.  No meningeal signs.   Cardiovascular: Normal rate, regular rhythm. Good peripheral circulation. Grossly normal heart sounds. Respiratory: Normal respiratory effort.  No retractions. Lungs CTAB. Gastrointestinal: Obese.  Soft and nontender. No distention.  Musculoskeletal: No lower extremity tenderness nor edema. No gross deformities of extremities.  Mild tenderness palpation of and around the right knee but without any obvious effusion.  Some pain with flexion extension of the right knee.  Some mild tenderness to palpation of the right calf which remains soft and easily compressible.  No specific tenderness to palpation along the venous distribution.  No evidence of cellulitis. Neurologic:  Normal speech and language. No gross focal neurologic  deficits are appreciated.  Skin:  Skin is warm, dry and intact. No rash noted. Psychiatric: Mood and affect are normal. Speech and behavior are normal.  ____________________________________________   LABS (all labs ordered are listed, but only abnormal results are displayed)  Labs Reviewed - No  data to display ____________________________________________  EKG  None - EKG not ordered by ED physician ____________________________________________  RADIOLOGY I, Hinda Kehr, personally viewed and evaluated these images (plain radiographs) as part of my medical decision making, as well as reviewing the written report by the radiologist.  ED MD interpretation: No indication of DVT nor Baker's cyst  Official radiology report(s): US Venous Img Lower Unilateral Right  Result Date: 11/03/2017 CLINICAL DATA:  Right lower extremity pain and edema for 3 weeks EXAM: Right LOWER EXTREMITY VENOUS DOPPLER ULTRASOUND TECHNIQUE: Gray-scale sonography with graded compression, as well as color Doppler and duplex ultrasound were performed to evaluate the lower extremity deep venous systems from the level of the common femoral vein and including the common femoral, femoral, profunda femoral, popliteal and calf veins including the posterior tibial, peroneal and gastrocnemius veins when visible. The superficial great saphenous vein was also interrogated. Spectral Doppler was utilized to evaluate flow at rest and with distal augmentation maneuvers in the common femoral, femoral and popliteal veins. COMPARISON:  Radiograph 11/02/2017 FINDINGS: Contralateral Common Femoral Vein: Respiratory phasicity is normal and symmetric with the symptomatic side. No evidence of thrombus. Normal compressibility. Common Femoral Vein: No evidence of thrombus. Normal compressibility, respiratory phasicity and response to augmentation. Saphenofemoral Junction: No evidence of thrombus. Normal compressibility and flow on color Doppler  imaging. Profunda Femoral Vein: No evidence of thrombus. Normal compressibility and flow on color Doppler imaging. Femoral Vein: No evidence of thrombus. Normal compressibility, respiratory phasicity and response to augmentation. Popliteal Vein: No evidence of thrombus. Normal compressibility, respiratory phasicity and response to augmentation. Calf Veins: No evidence of thrombus. Normal compressibility and flow on color Doppler imaging. Superficial Great Saphenous Vein: No evidence of thrombus. Normal compressibility. Venous Reflux:  None. Other Findings:  None. IMPRESSION: No evidence of deep venous thrombosis. Electronically Signed   By: Donavan Foil M.D.   On: 11/03/2017 01:50   Dg Knee Complete 4 Views Right  Result Date: 11/03/2017 CLINICAL DATA:  Severe knee pain EXAM: RIGHT KNEE - COMPLETE 4+ VIEW COMPARISON:  None. FINDINGS: No fracture or malalignment. Mild degenerative change of the medial compartment. Trace knee effusion possible small loose body in the inter spinous region of the proximal tibia. Diffuse soft tissue swelling. IMPRESSION: Minimal degenerative changes with trace effusion. No acute osseous abnormality. Electronically Signed   By: Donavan Foil M.D.   On: 11/03/2017 00:58    ____________________________________________   PROCEDURES  Critical Care performed: No   Procedure(s) performed:   Procedures   ____________________________________________   INITIAL IMPRESSION / ASSESSMENT AND PLAN / ED COURSE  As part of my medical decision making, I reviewed the following data within the [place] notes reviewed and incorporated and Old chart reviewed    Differential diagnosis includes, but is not limited to, musculoskeletal strain including chronic joint damage, Baker's cyst, DVT, infectious process such as a septic joint, etc.  Fortunately there is no evidence of any infectious process going on and it sounds like most of the issues are  chronic.  Given her chronic joint issues and body habitus I suspect that she is having some normal wear and tear on the joint that is led to some severe pain by this point.  However given the description of swelling and pain behind the knee with some swelling of the calf I do think it is reasonable to obtain an ultrasound to make sure she does not have a DVT, and it may identify Baker's cyst as well.  I set expectations with the patient that is unlikely I will be able to specifically identify a cause of her symptoms but if we rule out the emergent conditions such as DVT then she can follow-up with orthopedics and/or with vascular surgery if this turns out to be more of a peripheral vascular disease issue.  She understands and agrees with this plan.  I also obtain radiographs and see no evidence of any acute issue on my own viewing of the films but I am awaiting the official radiology report.  Clinical Course as of Nov 04 1007  Sat Nov 03, 2017  0223 The patient's ultrasound was negative for any acute abnormalities including DVT and Baker's cyst.  While I was involved with CPR on another patient, the patient eloped after telling the nurse that she had to go because her husband had to go to work in a few hours.  I called her just now by phone to give her the reassuring results of the ultrasound and she will follow-up with her primary care provider, likely to be referred to orthopedics.  I gave my usual and customary return precautions.   [CF]    Clinical Course User Index [CF] Hinda Kehr, MD    ____________________________________________  FINAL CLINICAL IMPRESSION(S) / ED DIAGNOSES  Final diagnoses:  Right knee pain, unspecified chronicity     MEDICATIONS GIVEN DURING THIS VISIT:  Medications - No data to display   ED Discharge Orders    None       Note:  This document was prepared using Dragon voice recognition software and may include unintentional dictation errors.    Hinda Kehr, MD 11/03/17 1009
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
## 2017-11-03 NOTE — ED Notes (Signed)
Pt reports that husband needs to go to work in a few hours, wants to leave att, EDP and charge Modena Nunnery, RN in family room with

## 2018-01-07 ENCOUNTER — Other Ambulatory Visit: Payer: Self-pay | Admitting: Family Medicine

## 2018-01-07 DIAGNOSIS — Z1231 Encounter for screening mammogram for malignant neoplasm of breast: Secondary | ICD-10-CM

## 2018-01-24 ENCOUNTER — Ambulatory Visit
Admission: RE | Admit: 2018-01-24 | Discharge: 2018-01-24 | Disposition: A | Discharge status: Home / Self Care | Payer: Medicare Other | Source: Ambulatory Visit | Attending: Family Medicine | Admitting: Family Medicine

## 2018-01-24 DIAGNOSIS — Z1231 Encounter for screening mammogram for malignant neoplasm of breast: Secondary | ICD-10-CM | POA: Insufficient documentation

## 2018-05-16 ENCOUNTER — Other Ambulatory Visit: Payer: Self-pay | Admitting: Student

## 2018-05-16 DIAGNOSIS — K760 Fatty (change of) liver, not elsewhere classified: Secondary | ICD-10-CM

## 2018-05-24 ENCOUNTER — Ambulatory Visit
Admission: RE | Admit: 2018-05-24 | Discharge: 2018-05-24 | Disposition: A | Discharge status: Home / Self Care | Payer: Medicare Other | Source: Ambulatory Visit | Attending: Student | Admitting: Student

## 2018-05-24 DIAGNOSIS — K760 Fatty (change of) liver, not elsewhere classified: Secondary | ICD-10-CM | POA: Insufficient documentation

## 2018-07-13 IMAGING — CT CT ABD-PELV W/ CM
2 of 5 series · 15 of 46 positions shown, 17 images · IV contrast (APPLIED)
Comparison: 07/25/2016 CT of the abdomen and pelvis.

CLINICAL DATA: 61 y/o  F; abdominal pain with nausea and vomiting.

EXAM:
CT ABDOMEN AND PELVIS WITH CONTRAST
TECHNIQUE: Multidetector CT imaging of the abdomen and pelvis was performed
using the standard protocol following bolus administration of
intravenous contrast.
CONTRAST:  100mL 1QDY4Y-P00 IOPAMIDOL (1QDY4Y-P00) INJECTION 61%

[Series 2: routine abd/pel with · axial · 0.90mm/px · z∈[-508,-83]mm · 12 of 95 slices shown, 14 images]
[im 5/95  soft-tissue]
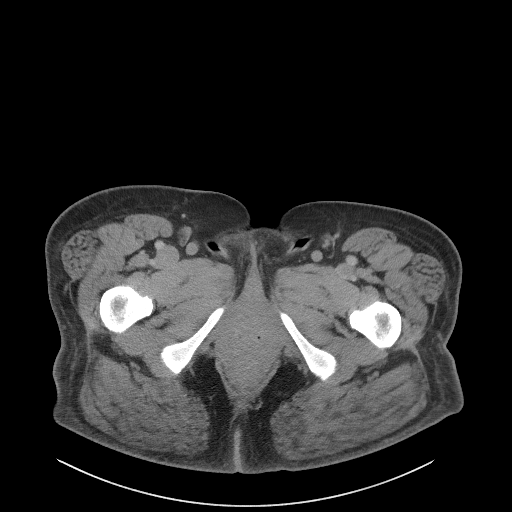
[im 5/95  bone]
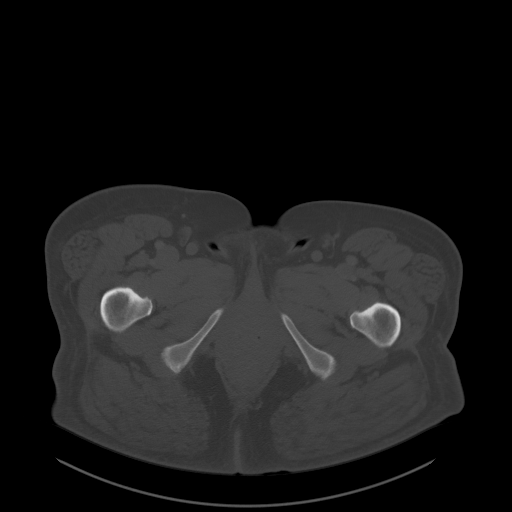
[im 15/95  soft-tissue]
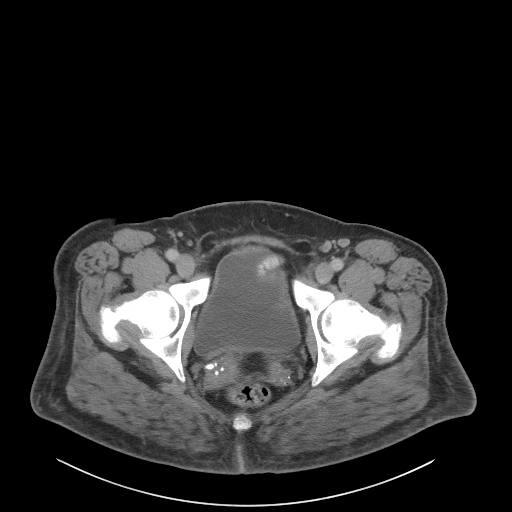
[im 20/95  soft-tissue]
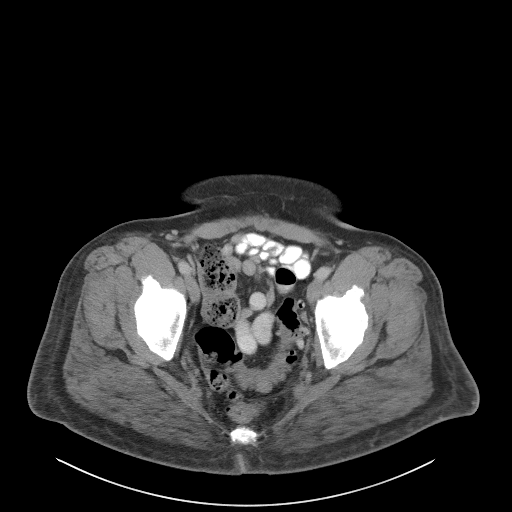
[im 30/95  soft-tissue]
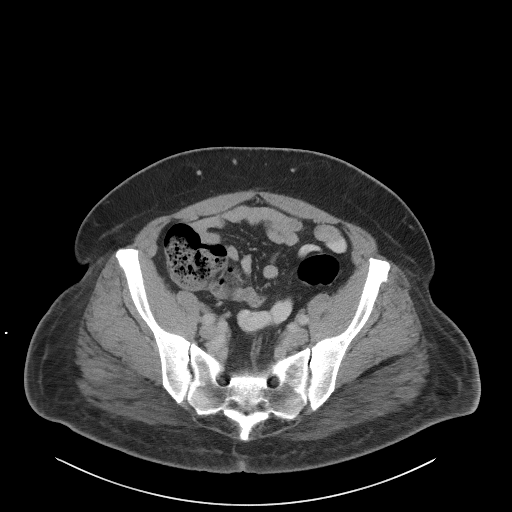
[im 35/95  soft-tissue]
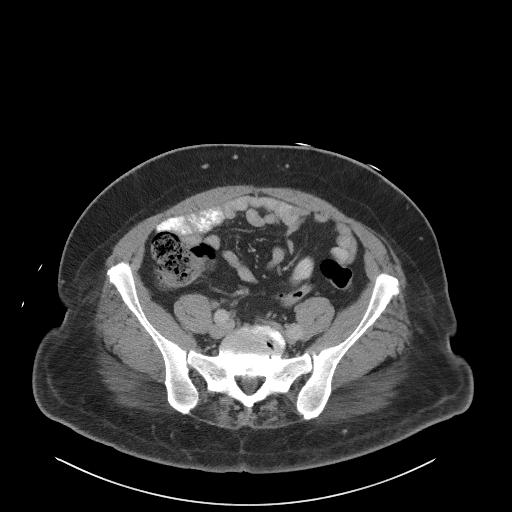
[im 45/95  soft-tissue]
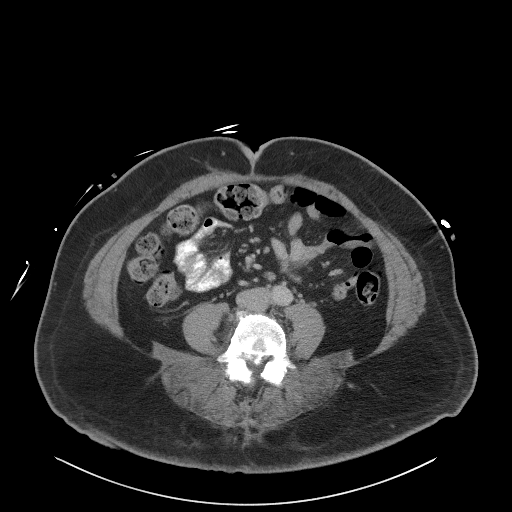
[im 50/95  soft-tissue]
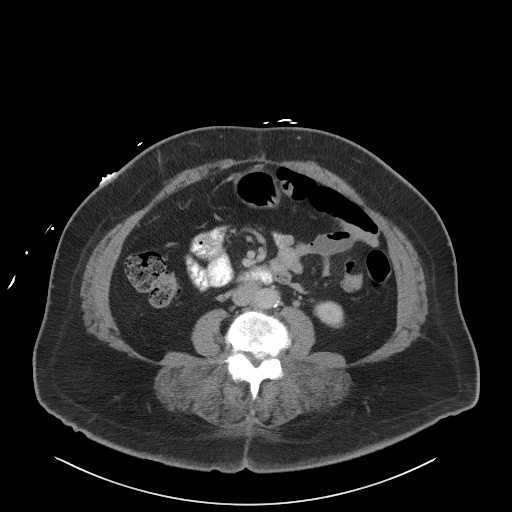
[im 60/95  soft-tissue]
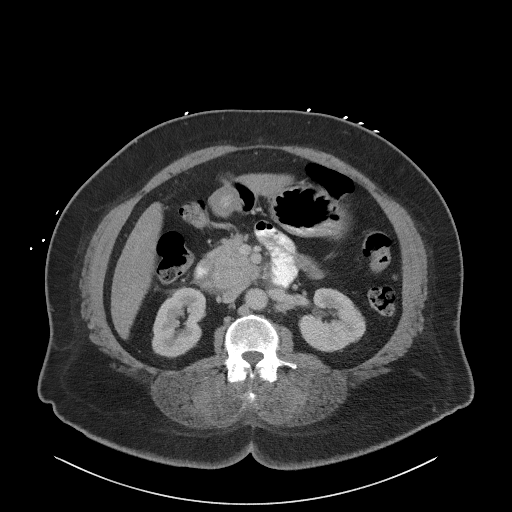
[im 65/95  soft-tissue]
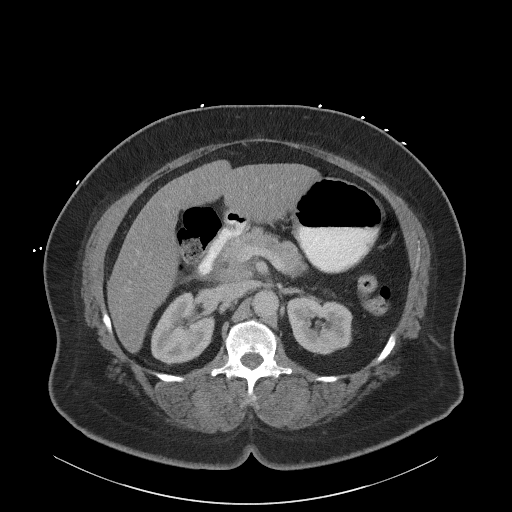
[im 65/95  bone]
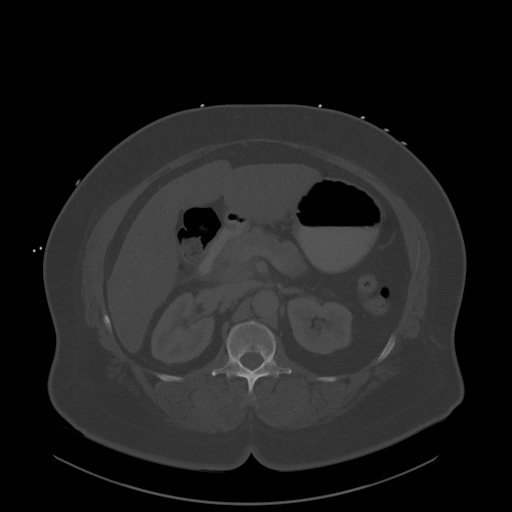
[im 75/95  soft-tissue]
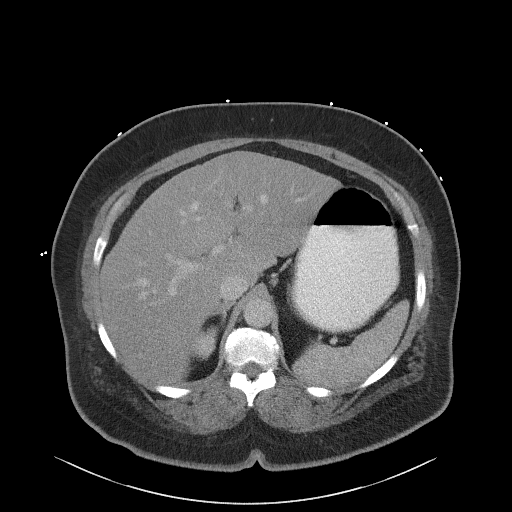
[im 80/95  soft-tissue]
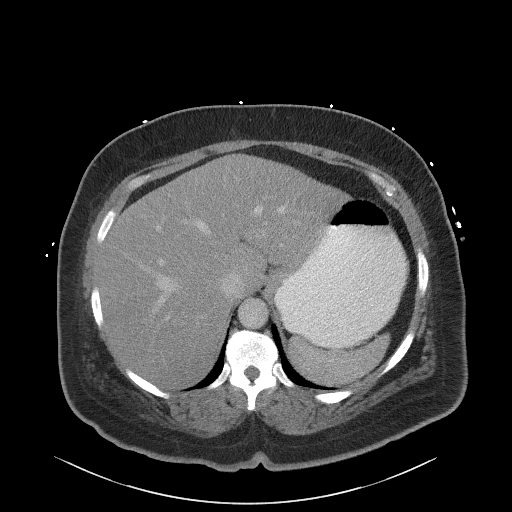
[im 90/95  soft-tissue]
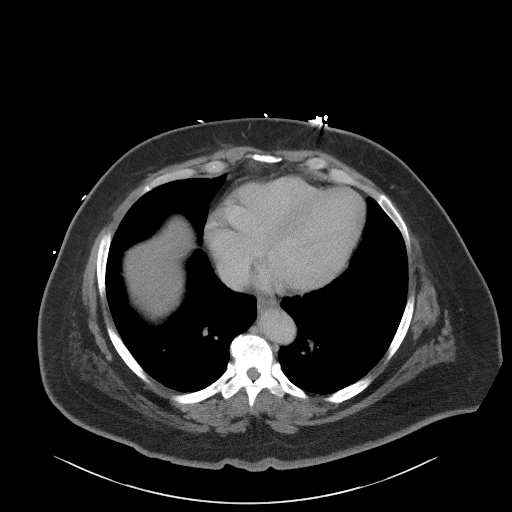

[Series 5: coronal st · coronal · 0.68mm/px · 3 of 106 slices shown]
[im 36/106  soft-tissue]
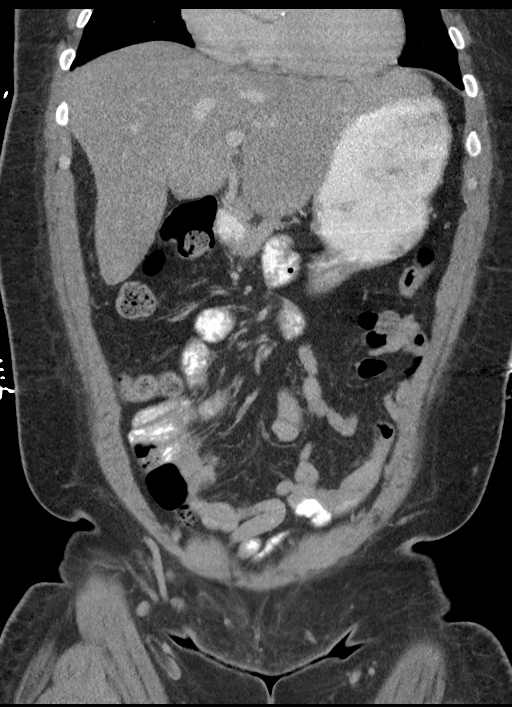
[im 47/106  soft-tissue]
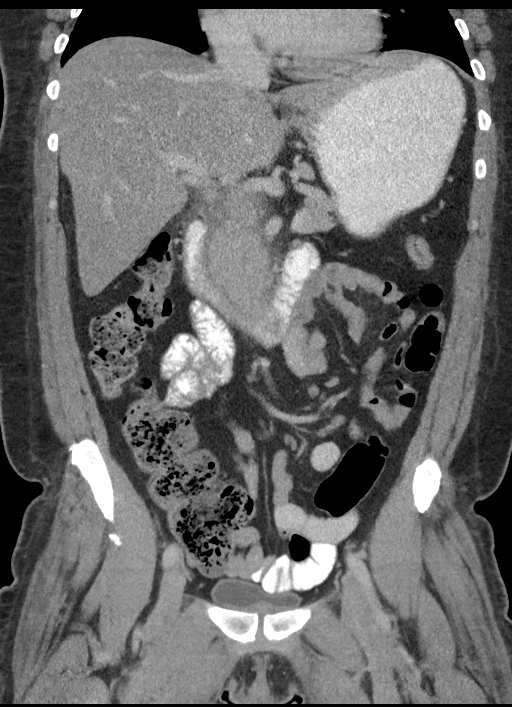
[im 59/106  soft-tissue]
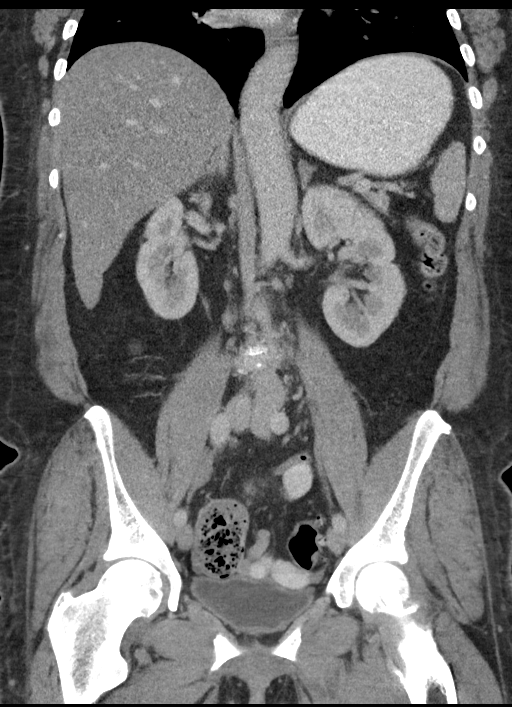

[15 of 46 positions shown; findings below may reference images not displayed]

FINDINGS: Lower chest: No acute abnormality.

Hepatobiliary: Hepatic steatosis and mild hepatomegaly. Status post
cholecystectomy. Prominence of the common bile duct is likely
compensatory post cholecystectomy.

Pancreas: There is mild edema surrounding the head of pancreas and
along the course of the second and third portions of the duodenum.
No acute peripancreatic collection or evidence for pancreatic
necrosis.

Spleen: Normal in size without focal abnormality.

Adrenals/Urinary Tract: Adrenal glands are unremarkable. Kidneys are
normal, without renal calculi, focal lesion, or hydronephrosis.
Bladder is unremarkable.

Stomach/Bowel: Stomach is within normal limits. Appendix appears
normal. No evidence of bowel wall thickening, distention, or
inflammatory changes. Extensive sigmoid diverticulosis without
evidence for acute diverticulitis.

Vascular/Lymphatic: Aortic atherosclerosis with minimal
calcification. No enlarged abdominal or pelvic lymph nodes.

Reproductive: Status post hysterectomy. No adnexal masses.

Other: No abdominal wall hernia or abnormality. No abdominopelvic
ascites.

Musculoskeletal: No acute or significant osseous findings. Advanced
lumbar degenerative changes greatest at the L4-5 and L5-S1 levels
where there is multifactorial at least moderate canal stenosis.
Status post L4-5 laminectomy.
IMPRESSION: 1. Edema surrounding the head of pancreas, along course of second
and third portions of duodenum, and duodenum wall thickening.
Differential includes acute duodenitis or pancreatitis. No discrete
abscess or evidence for bowel perforation.
2. Stable hepatic steatosis and hepatomegaly.
3. Stable extensive sigmoid diverticulosis without evidence for
acute diverticulitis.
4. Stable lumbar degenerative changes greatest at L3-4 and L4-5
where there is multifactorial at least moderate canal stenosis.

By: Hidetugu Tachi M.D.

## 2018-11-21 ENCOUNTER — Other Ambulatory Visit: Payer: Self-pay | Admitting: *Deleted

## 2018-11-21 DIAGNOSIS — Z20822 Contact with and (suspected) exposure to covid-19: Secondary | ICD-10-CM

## 2018-11-26 LAB — NOVEL CORONAVIRUS, NAA: SARS-CoV-2, NAA: NOT DETECTED

## 2019-01-13 ENCOUNTER — Other Ambulatory Visit: Payer: Self-pay

## 2019-01-13 ENCOUNTER — Emergency Department
Admission: EM | Admit: 2019-01-13 | Discharge: 2019-01-13 | Disposition: A | Discharge status: Home / Self Care | Payer: Medicare Other | Attending: Emergency Medicine | Admitting: Emergency Medicine

## 2019-01-13 DIAGNOSIS — Z87891 Personal history of nicotine dependence: Secondary | ICD-10-CM | POA: Insufficient documentation

## 2019-01-13 DIAGNOSIS — Z79899 Other long term (current) drug therapy: Secondary | ICD-10-CM | POA: Diagnosis not present

## 2019-01-13 DIAGNOSIS — Z794 Long term (current) use of insulin: Secondary | ICD-10-CM | POA: Insufficient documentation

## 2019-01-13 DIAGNOSIS — M545 Low back pain, unspecified: Secondary | ICD-10-CM

## 2019-01-13 DIAGNOSIS — H109 Unspecified conjunctivitis: Secondary | ICD-10-CM | POA: Insufficient documentation

## 2019-01-13 DIAGNOSIS — I10 Essential (primary) hypertension: Secondary | ICD-10-CM | POA: Insufficient documentation

## 2019-01-13 DIAGNOSIS — E119 Type 2 diabetes mellitus without complications: Secondary | ICD-10-CM | POA: Insufficient documentation

## 2019-01-13 DIAGNOSIS — H5789 Other specified disorders of eye and adnexa: Secondary | ICD-10-CM | POA: Diagnosis present

## 2019-01-13 DIAGNOSIS — Z7189 Other specified counseling: Secondary | ICD-10-CM

## 2019-01-13 DIAGNOSIS — B309 Viral conjunctivitis, unspecified: Secondary | ICD-10-CM

## 2019-01-13 LAB — COMPREHENSIVE METABOLIC PANEL
ALT: 25 U/L (ref 0–44)
AST: 19 U/L (ref 15–41)
Albumin: 3.8 g/dL (ref 3.5–5.0)
Alkaline Phosphatase: 60 U/L (ref 38–126)
Anion gap: 10 (ref 5–15)
BUN: 8 mg/dL (ref 8–23)
CO2: 28 mmol/L (ref 22–32)
Calcium: 9.4 mg/dL (ref 8.9–10.3)
Chloride: 104 mmol/L (ref 98–111)
Creatinine, Ser: 0.85 mg/dL (ref 0.44–1.00)
GFR calc Af Amer: >60 mL/min (ref >60)
GFR calc non Af Amer: >60 mL/min (ref >60)
Glucose, Bld: 138 mg/dL — ABNORMAL HIGH (ref 70–99)
Potassium: 3.5 mmol/L (ref 3.5–5.1)
Sodium: 142 mmol/L (ref 135–145)
Total Bilirubin: 0.6 mg/dL (ref 0.3–1.2)
Total Protein: 8.1 g/dL (ref 6.5–8.1)

## 2019-01-13 LAB — URINALYSIS, COMPLETE (UACMP) WITH MICROSCOPIC
Bacteria, UA: NONE SEEN
Bilirubin Urine: NEGATIVE
Glucose, UA: >=500 mg/dL — AB
Hgb urine dipstick: NEGATIVE
Ketones, ur: NEGATIVE mg/dL
Nitrite: NEGATIVE
Protein, ur: NEGATIVE mg/dL
Specific Gravity, Urine: 1.029 (ref 1.005–1.030)
pH: 6 (ref 5.0–8.0)

## 2019-01-13 LAB — LIPASE, BLOOD: Lipase: 21 U/L (ref 11–51)

## 2019-01-13 LAB — CBC
HCT: 37.5 % (ref 36.0–46.0)
Hemoglobin: 12 g/dL (ref 12.0–15.0)
MCH: 26.4 pg (ref 26.0–34.0)
MCHC: 32 g/dL (ref 30.0–36.0)
MCV: 82.6 fL (ref 80.0–100.0)
Platelets: 547 10*3/uL — ABNORMAL HIGH (ref 150–400)
RBC: 4.54 MIL/uL (ref 3.87–5.11)
RDW: 13.2 % (ref 11.5–15.5)
WBC: 7.3 10*3/uL (ref 4.0–10.5)
nRBC: 0 % (ref 0.0–0.2)

## 2019-01-13 MED ORDER — HYPROMELLOSE (GONIOSCOPIC) 2.5 % OP SOLN: 1.0000 [drp] | OPHTHALMIC | 12 refills | Status: AC | PRN

## 2019-01-13 MED ORDER — NEOMYCIN-POLYMYXIN-DEXAMETH 3.5-10000-0.1 OP SUSP: 2.0000 [drp] | Freq: Four times a day (QID) | OPHTHALMIC | 0 refills | Status: AC

## 2019-01-13 NOTE — ED Triage Notes (Signed)
Pt c/o right eye redness with drainage for the past 4 days, also c/o left back pain since last night.

## 2019-01-13 NOTE — ED Notes (Signed)
Pt signed printed d/c paperwork as was in hallway bed.
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
## 2019-01-13 NOTE — ED Provider Notes (Signed)
[facility] Emergency Department Provider Note  ____________________________________________  Time seen: Approximately 1:14 PM  I have reviewed the triage vital signs and the nursing notes.   HISTORY  Chief Complaint Eye Pain and Back Pain    HPI Alize Elcock Mura is a 63 y.o. female with a history of diabetes hypertension IBS who comes the ED complaining of right eye irritation and light sensitivity for the past 4 days.  She has some dull pain in the eye as well.  No fevers or chills or headaches.  No eye trauma.  Symptoms are waxing and waning without aggravating or alleviating factors.  She also complains of left lower back pain that started last night, gradual onset, constant, nonradiating, worse standing up and movement, no alleviating factors.  No abdominal pain vomiting diarrhea constipation.  She also notes that she and her husband were both diagnosed with COVID about 10 days ago.  She is still under quarantine for another 3 days at home.  Denies any chest pain or shortness of breath.  No body aches cough sore throat.      Past Medical History:  Diagnosis Date  . DDD (degenerative disc disease), lumbar   . Depression   . Diabetes mellitus without complication ([place])   . Diverticulitis   . Fatty liver   . Fibroid tumor   . GERD (gastroesophageal reflux disease)   . Gout   . H pylori ulcer   . Hemorrhoids   . Hypercholesteremia   . Hypertension   . IBS (irritable bowel syndrome)   . Spinal stenosis      Patient Active Problem List   Diagnosis Date Noted  . Duodenitis 10/29/2016  . Elevated troponin 10/29/2016  . Elevated uric acid in blood 12/02/2014  . B12 deficiency 11/05/2014  . Benign essential HTN 11/05/2014  . Cramps of lower extremity 11/05/2014  . Chronic LBP 11/05/2014  . Chronic bronchitis ([place]) 11/05/2014  . Cystitis 11/05/2014  . Diverticulosis of colon 11/05/2014  . Dyslipidemia 11/05/2014  . Elevated erythrocyte  sedimentation rate 11/05/2014  . Gastro-esophageal reflux disease without esophagitis 11/05/2014  . Bleeding internal hemorrhoids 11/05/2014  . Disease of spleen 11/05/2014  . Perennial allergic rhinitis 11/05/2014  . Obesity (BMI 30-39.9) 11/05/2014  . Hemorrhage of rectum and anus 11/05/2014  . Rotator cuff syndrome 11/05/2014  . Leg varices 11/05/2014  . Vitamin D deficiency 11/05/2014  . DJD of shoulder 10/16/2014  . DDD (degenerative disc disease), lumbosacral 09/18/2014  . DDD (degenerative disc disease), cervical 09/18/2014  . DJD (degenerative joint disease) 09/18/2014  . Neuropathy due to secondary diabetes ([place]) 09/18/2014  . Diabetes mellitus type 2, uncontrolled ([place]) 07/14/2014  . Long term current use of insulin ([place]) 07/14/2014  . Arthritis, degenerative 09/30/2013  . Arthritis or polyarthritis, rheumatoid ([place]) 09/30/2013     Past Surgical History:  Procedure Laterality Date  . ABDOMINAL HYSTERECTOMY     partial  . BACK SURGERY    . CHOLECYSTECTOMY    . COLONOSCOPY WITH PROPOFOL N/A 08/18/2015   Procedure: COLONOSCOPY WITH PROPOFOL;  Surgeon: Manya Silvas, MD;  Location: [facility] ENDOSCOPY;  Service: Endoscopy;  Laterality: N/A;  . ECTOPIC PREGNANCY SURGERY    . ESOPHAGOGASTRODUODENOSCOPY (EGD) WITH PROPOFOL N/A 08/18/2015   Procedure: ESOPHAGOGASTRODUODENOSCOPY (EGD) WITH PROPOFOL;  Surgeon: Manya Silvas, MD;  Location: [facility] ENDOSCOPY;  Service: Endoscopy;  Laterality: N/A;  . FOOT SURGERY    . NECK SURGERY    . SHOULDER SURGERY Right  Prior to Admission medications   Medication Sig Start Date End Date Taking? Authorizing Provider  allopurinol (ZYLOPRIM) 100 MG tablet Take 1 tablet (100 mg total) by mouth daily. Patient not taking: Reported on 11/11/2015 12/02/14   Steele Sizer, MD  amLODipine (NORVASC) 10 MG tablet TAKE 1 TABLET(10 MG TOTAL) BY MOUTH DAILY. 11/08/15   Steele Sizer, MD  dicyclomine (BENTYL) 20 MG tablet Take 1 tablet (20 mg total)  by mouth 3 (three) times daily as needed. Patient not taking: Reported on 01/03/2016 07/19/15   Orbie Pyo, MD  ergocalciferol (VITAMIN D2) 50000 UNITS capsule Take 1 capsule (50,000 Units total) by mouth once a week. Patient not taking: Reported on 11/11/2015 12/02/14   Steele Sizer, MD  fluticasone [facility]) 50 MCG/ACT nasal spray Place 2 sprays into both nostrils as needed for allergies. 11/06/14   Steele Sizer, MD  gabapentin (NEURONTIN) 300 MG capsule Limit 1-2 tabs by mouth 2-3 times per day if tolerated 01/03/16   Mohammed Kindle, MD  glipiZIDE (GLUCOTROL) 10 MG tablet Take 10 mg by mouth daily before breakfast.     [provider]  HYDROcodone-acetaminophen (NORCO/VICODIN) 5-325 MG tablet Limit 1 tab by mouth per day or twice a day if tolerated 10/30/16   Dustin Flock, MD  hydroxychloroquine (PLAQUENIL) 200 MG tablet Take 200 mg by mouth 2 (two) times daily.     [provider]  hydroxypropyl methylcellulose / hypromellose (ISOPTO TEARS / GONIOVISC) 2.5 % ophthalmic solution Place 1 drop into the right eye every hour as needed for dry eyes. 01/13/19   Carrie Mew, MD  insulin lispro (HUMALOG) 100 UNIT/ML injection Inject 65 Units into the skin daily. In middle of day    [provider]  insulin NPH Human (HUMULIN N,NOVOLIN N) 100 UNIT/ML injection Inject 85 Units into the skin 2 (two) times daily before a meal. 9 am and 9 pm    [provider]  losartan-hydrochlorothiazide (HYZAAR) 100-25 MG tablet Take 1 tablet by mouth daily. 10/14/16   [provider]  metFORMIN (GLUCOPHAGE-XR) 500 MG 24 hr tablet Take 500 mg by mouth daily. 09/05/16   [provider]  metoprolol succinate (TOPROL-XL) 50 MG 24 hr tablet Take 1 tablet (50 mg total) by mouth daily. Take with or immediately following a meal. 11/06/14   Sowles, Drue Stager, MD  neomycin-polymyxin b-dexamethasone (MAXITROL) 3.5-10000-0.1 SUSP Place 2 drops into the right eye every  6 (six) hours for 7 days. 01/13/19 01/20/19  Carrie Mew, MD  pantoprazole (PROTONIX) 40 MG tablet Take 40 mg by mouth 2 (two) times daily. 10/17/16   [provider]  polyethylene glycol (MIRALAX) packet Take 17 g by mouth daily. 10/30/16   Dustin Flock, MD  pravastatin (PRAVACHOL) 40 MG tablet TAKE 1 TABLET BY MOUTH EVERY EVENING FOR CHOLESTEROL IN PLACE OF LIPITOR 02/05/15   Steele Sizer, MD  ranitidine (ZANTAC) 150 MG tablet Take 1 tablet (150 mg total) by mouth 2 (two) times daily. Patient not taking: Reported on 01/03/2016 11/20/14   Steele Sizer, MD  senna-docusate (SENOKOT-S) 8.6-50 MG tablet Take 1 tablet by mouth 2 (two) times daily. 10/30/16   Dustin Flock, MD  sertraline (ZOLOFT) 50 MG tablet Take 50 mg by mouth daily.    [provider]  sucralfate (CARAFATE) 1 G tablet Take 1 tablet by mouth 4 (four) times daily. Reported on 11/11/2015 11/26/14   [provider]  telmisartan-hydrochlorothiazide (MICARDIS HCT) 80-25 MG per tablet Take 1 tablet by mouth daily. Patient not taking:  Reported on 10/29/2016 11/06/14   Steele Sizer, MD     Allergies Ace inhibitors and Sulfa antibiotics   Family History  Problem Relation Age of Onset  . Diabetes Mother   . Diabetes Father   . Kidney disease Father   . Diabetes Sister   . Kidney disease Brother        Dialysis  . Stroke Brother   . Breast cancer Neg Hx     Social History Social History   Tobacco Use  . Smoking status: Former Smoker    Packs/day: 0.50    Years: 32.00    Pack years: 16.00    Types: Cigarettes    Start date: 05/15/1972    Quit date: 09/20/2004    Years since quitting: 14.3  . Smokeless tobacco: Never Used  Substance Use Topics  . Alcohol use: No    Alcohol/week: 0.0 standard drinks  . Drug use: No    Review of Systems  Constitutional:   No fever or chills.  ENT:   No sore throat. No rhinorrhea.  Positive right eye pain Cardiovascular:   No chest pain or  syncope. Respiratory:   No dyspnea or cough. Gastrointestinal:   Negative for abdominal pain, vomiting and diarrhea.  Musculoskeletal: Left lower back tenderness reproducing pain.  No midline spinal tenderness.  No CVA tenderness All other systems reviewed and are negative except as documented above in ROS and HPI.  ____________________________________________   PHYSICAL EXAM:  VITAL SIGNS: ED Triage Vitals  Enc Vitals Group     BP 01/13/19 1012 (!) 146/89     Pulse Rate 01/13/19 1012 78     Resp 01/13/19 1012 15     Temp 01/13/19 1012 98.8 F (37.1 C)     Temp Source 01/13/19 1012 Oral     SpO2 01/13/19 1012 98 %     Weight 01/13/19 1014 228 lb (103.4 kg)     Height 01/13/19 1014 5\' 6"  (1.676 m)     Head Circumference --      Peak Flow --      Pain Score 01/13/19 1014 8     Pain Loc --      Pain Edu? --      Excl. in [place]? --     Vital signs reviewed, nursing assessments reviewed.   Constitutional:   Alert and oriented. Non-toxic appearance. Eyes:   Right eye conjunctivae are inflamed and have diffuse hazy erythema.  Lids everted for exam, no foreign bodies.  No APD. EOMI and painless. PERRL.  No nystagmus. ENT      Head:   Normocephalic and atraumatic.      Nose:   No congestion/rhinnorhea.       Mouth/Throat:   MMM, no pharyngeal erythema. No peritonsillar mass.       Neck:   No meningismus. Full ROM. Hematological/Lymphatic/Immunilogical:   No cervical lymphadenopathy. Cardiovascular:   RRR. Symmetric bilateral radial and DP pulses.  No murmurs. Cap refill less than 2 seconds. Respiratory:   Normal respiratory effort without tachypnea/retractions. Breath sounds are clear and equal bilaterally. No wheezes/rales/rhonchi. Gastrointestinal:   Soft and nontender. Non distended. There is no CVA tenderness.  No rebound, rigidity, or guarding.  Musculoskeletal:   Normal range of motion in all extremities. No joint effusions.  No lower extremity tenderness.  Left lower back  tenderness in the musculature which reproduces her symptoms. Neurologic:   Normal speech and language.  Motor grossly intact. No acute focal neurologic deficits are appreciated.  Skin:    Skin is warm, dry and intact. No rash noted.  No petechiae, purpura, or bullae.  No evidence of rash or soft tissue infection in the trunk  ____________________________________________    LABS (pertinent positives/negatives) (all labs ordered are listed, but only abnormal results are displayed) Labs Reviewed  COMPREHENSIVE METABOLIC PANEL - Abnormal; Notable for the following components:      Result Value   Glucose, Bld 138 (*)    All other components within normal limits  CBC - Abnormal; Notable for the following components:   Platelets 547 (*)    All other components within normal limits  URINALYSIS, COMPLETE (UACMP) WITH MICROSCOPIC - Abnormal; Notable for the following components:   Color, Urine YELLOW (*)    APPearance CLEAR (*)    Glucose, UA >=500 (*)    Leukocytes,Ua TRACE (*)    All other components within normal limits  LIPASE, BLOOD   ____________________________________________   EKG    ____________________________________________    RADIOLOGY  No results found.  ____________________________________________   PROCEDURES Procedures  ____________________________________________    CLINICAL IMPRESSION / ASSESSMENT AND PLAN / ED COURSE  Medications ordered in the ED: Medications - No data to display  Pertinent labs & imaging results that were available during my care of the patient were reviewed by me and considered in my medical decision making (see chart for details).  Corissa Puccini Reinitz was evaluated in Emergency Department on 01/13/2019 for the symptoms described in the history of present illness. She was evaluated in the context of the global COVID-19 pandemic, which necessitated consideration that the patient might be at risk for infection with the SARS-CoV-2 virus  that causes COVID-19. Institutional protocols and algorithms that pertain to the evaluation of patients at risk for COVID-19 are in a state of rapid change based on information released by regulatory bodies including the CDC and federal and state organizations. These policies and algorithms were followed during the patient's care in the ED.   Patient presents with eye pain and left lower back pain.  Low back pain appears to be musculoskeletal.  Doubt cauda equina, meningitis, discitis, spinal fracture.  No neurologic deficits.  Will continue NSAIDs for this and heat therapy.  Right eye symptoms are consistent with viral conjunctivitis, which makes sense given her recent coronavirus diagnosis.  Patient on further questioning does note that her left eye is starting to feel itchy and irritated today I counseled it would not be unexpected for the left eye to develop the same symptoms.  Given her comorbidities, will prescribe antibiotic, steroid drops for the eye, artificial tears as needed, follow-up with primary care.  Labs urinalysis and vitals are all reassuring.  Patient is nontoxic.  No evidence of respiratory complications      ____________________________________________   FINAL CLINICAL IMPRESSION(S) / ED DIAGNOSES    Final diagnoses:  Acute viral conjunctivitis of right eye  Advice Given About Covid-19 Virus Infection  Acute left-sided low back pain without sciatica     ED Discharge Orders         Ordered    neomycin-polymyxin b-dexamethasone (MAXITROL) 3.5-10000-0.1 SUSP  Every 6 hours     01/13/19 1311    hydroxypropyl methylcellulose / hypromellose (ISOPTO TEARS / GONIOVISC) 2.5 % ophthalmic solution  Every 1 hour PRN     01/13/19 1311          Portions of this note were generated with dragon dictation software. Dictation errors may occur despite best attempts at proofreading.  Carrie Mew, MD 01/13/19 1326

## 2019-01-13 NOTE — ED Notes (Signed)
EDP [place] at bedside.  

## 2019-01-13 NOTE — Discharge Instructions (Addendum)
Please continue your quarantine at home as instructed by your primary care doctor and the department of health.    Use the eye ointment as prescribed for conjunctivitis.  If you have eye discomfort at other times of day, a liquid eye lubricant such as Artificial Tears can be used to sooth your eye.

## 2019-01-22 ENCOUNTER — Other Ambulatory Visit: Payer: Self-pay | Admitting: Family Medicine

## 2019-01-22 DIAGNOSIS — Z1231 Encounter for screening mammogram for malignant neoplasm of breast: Secondary | ICD-10-CM

## 2019-02-26 ENCOUNTER — Ambulatory Visit
Admission: RE | Admit: 2019-02-26 | Discharge: 2019-02-26 | Disposition: A | Discharge status: Home / Self Care | Payer: Medicare Other | Source: Ambulatory Visit | Attending: Family Medicine | Admitting: Family Medicine

## 2019-02-26 DIAGNOSIS — Z1231 Encounter for screening mammogram for malignant neoplasm of breast: Secondary | ICD-10-CM | POA: Insufficient documentation

## 2019-05-16 DIAGNOSIS — R911 Solitary pulmonary nodule: Secondary | ICD-10-CM

## 2019-05-16 HISTORY — DX: Solitary pulmonary nodule: R91.1

## 2019-07-08 ENCOUNTER — Other Ambulatory Visit: Payer: Self-pay | Admitting: Family Medicine

## 2019-07-08 DIAGNOSIS — I10 Essential (primary) hypertension: Secondary | ICD-10-CM

## 2019-07-08 DIAGNOSIS — R042 Hemoptysis: Secondary | ICD-10-CM

## 2019-07-17 ENCOUNTER — Other Ambulatory Visit: Payer: Self-pay

## 2019-07-17 ENCOUNTER — Ambulatory Visit
Admission: RE | Admit: 2019-07-17 | Discharge: 2019-07-17 | Disposition: A | Discharge status: Home / Self Care | Payer: Medicare Other | Source: Ambulatory Visit | Attending: Family Medicine | Admitting: Family Medicine

## 2019-07-17 DIAGNOSIS — R042 Hemoptysis: Secondary | ICD-10-CM | POA: Insufficient documentation

## 2019-07-17 DIAGNOSIS — I10 Essential (primary) hypertension: Secondary | ICD-10-CM | POA: Diagnosis present

## 2019-07-25 DIAGNOSIS — Z79899 Other long term (current) drug therapy: Secondary | ICD-10-CM | POA: Insufficient documentation

## 2019-07-25 DIAGNOSIS — Z1231 Encounter for screening mammogram for malignant neoplasm of breast: Secondary | ICD-10-CM | POA: Insufficient documentation

## 2019-10-23 DIAGNOSIS — R911 Solitary pulmonary nodule: Secondary | ICD-10-CM | POA: Insufficient documentation

## 2020-01-13 ENCOUNTER — Emergency Department
Admission: EM | Admit: 2020-01-13 | Discharge: 2020-01-13 | Disposition: A | Discharge status: Home / Self Care | Payer: Medicare Other | Attending: Emergency Medicine | Admitting: Emergency Medicine

## 2020-01-13 ENCOUNTER — Emergency Department: Payer: Medicare Other

## 2020-01-13 ENCOUNTER — Other Ambulatory Visit: Payer: Self-pay

## 2020-01-13 DIAGNOSIS — R102 Pelvic and perineal pain: Secondary | ICD-10-CM

## 2020-01-13 DIAGNOSIS — K5790 Diverticulosis of intestine, part unspecified, without perforation or abscess without bleeding: Secondary | ICD-10-CM | POA: Diagnosis not present

## 2020-01-13 DIAGNOSIS — I10 Essential (primary) hypertension: Secondary | ICD-10-CM | POA: Insufficient documentation

## 2020-01-13 DIAGNOSIS — E1140 Type 2 diabetes mellitus with diabetic neuropathy, unspecified: Secondary | ICD-10-CM | POA: Insufficient documentation

## 2020-01-13 DIAGNOSIS — Z79899 Other long term (current) drug therapy: Secondary | ICD-10-CM | POA: Insufficient documentation

## 2020-01-13 DIAGNOSIS — Z87891 Personal history of nicotine dependence: Secondary | ICD-10-CM | POA: Insufficient documentation

## 2020-01-13 DIAGNOSIS — Z794 Long term (current) use of insulin: Secondary | ICD-10-CM | POA: Insufficient documentation

## 2020-01-13 LAB — COMPREHENSIVE METABOLIC PANEL
ALT: 16 U/L (ref 0–44)
AST: 18 U/L (ref 15–41)
Albumin: 4.1 g/dL (ref 3.5–5.0)
Alkaline Phosphatase: 74 U/L (ref 38–126)
Anion gap: 13 (ref 5–15)
BUN: 10 mg/dL (ref 8–23)
CO2: 25 mmol/L (ref 22–32)
Calcium: 9.4 mg/dL (ref 8.9–10.3)
Chloride: 101 mmol/L (ref 98–111)
Creatinine, Ser: 0.89 mg/dL (ref 0.44–1.00)
GFR calc Af Amer: >60 mL/min (ref >60)
GFR calc non Af Amer: >60 mL/min (ref >60)
Glucose, Bld: 179 mg/dL — ABNORMAL HIGH (ref 70–99)
Potassium: 3.6 mmol/L (ref 3.5–5.1)
Sodium: 139 mmol/L (ref 135–145)
Total Bilirubin: 0.8 mg/dL (ref 0.3–1.2)
Total Protein: 7.7 g/dL (ref 6.5–8.1)

## 2020-01-13 LAB — CBC WITH DIFFERENTIAL/PLATELET
Abs Immature Granulocytes: 0.01 10*3/uL (ref 0.00–0.07)
Basophils Absolute: 0 10*3/uL (ref 0.0–0.1)
Basophils Relative: 0 %
Eosinophils Absolute: 0.1 10*3/uL (ref 0.0–0.5)
Eosinophils Relative: 1 %
HCT: 38.1 % (ref 36.0–46.0)
Hemoglobin: 12.4 g/dL (ref 12.0–15.0)
Immature Granulocytes: 0 %
Lymphocytes Relative: 39 %
Lymphs Abs: 3 10*3/uL (ref 0.7–4.0)
MCH: 27.3 pg (ref 26.0–34.0)
MCHC: 32.5 g/dL (ref 30.0–36.0)
MCV: 83.7 fL (ref 80.0–100.0)
Monocytes Absolute: 0.6 10*3/uL (ref 0.1–1.0)
Monocytes Relative: 7 %
Neutro Abs: 4 10*3/uL (ref 1.7–7.7)
Neutrophils Relative %: 53 %
Platelets: 337 10*3/uL (ref 150–400)
RBC: 4.55 MIL/uL (ref 3.87–5.11)
RDW: 13.4 % (ref 11.5–15.5)
WBC: 7.6 10*3/uL (ref 4.0–10.5)
nRBC: 0 % (ref 0.0–0.2)

## 2020-01-13 LAB — URINALYSIS, COMPLETE (UACMP) WITH MICROSCOPIC
Bacteria, UA: NONE SEEN
Bilirubin Urine: NEGATIVE
Glucose, UA: NEGATIVE mg/dL
Hgb urine dipstick: NEGATIVE
Ketones, ur: NEGATIVE mg/dL
Leukocytes,Ua: NEGATIVE
Nitrite: NEGATIVE
Protein, ur: NEGATIVE mg/dL
Specific Gravity, Urine: 1.021 (ref 1.005–1.030)
pH: 5 (ref 5.0–8.0)

## 2020-01-13 MED ORDER — DICYCLOMINE HCL 10 MG PO CAPS: 10.0000 mg | ORAL_CAPSULE | Freq: Four times a day (QID) | ORAL | 0 refills | Status: DC

## 2020-01-13 NOTE — Discharge Instructions (Signed)
Please follow up with your primary care provider for symptoms that are not improving over the week.  Return to the ER for symptoms that change or worsen if unable to schedule an appointment.

## 2020-01-13 NOTE — ED Triage Notes (Signed)
Reports LLQ pain "tube pain" that started last night with intermittent pain and becoming more intense and frequent today. denies any urinary sx. Denies NVD. Last BM today, normal.

## 2020-01-13 NOTE — ED Provider Notes (Signed)
[facility] Emergency Department Provider Note ____________________________________________   First MD Initiated Contact with Patient 01/13/20 1723     (approximate)  I have reviewed the triage vital signs and the nursing notes.   HISTORY  Chief Complaint Pelvic Pain  HPI Charlotte [name] is a 64 y.o. female with history of history as listed history as listed below presents to the emergency department for treatment and evaluation left pelvic pain.  Patient states that she had an ectopic pregnancy several years ago and then had a partial hysterectomy.  She states the pain feels like it is in the "tube."  She denies any vaginal bleeding, vaginal discharge, or concern of STD.  She denies dysuria or other symptoms.  She states that the pain is sharp and comes in waves.  She denies History of kidney stone.  She denies hematuria.  No alleviating measures attempted prior to arrival..         Past Medical History:  Diagnosis Date   DDD (degenerative disc disease), lumbar    Depression    Diabetes mellitus without complication ([place])    Diverticulitis    Fatty liver    Fibroid tumor    GERD (gastroesophageal reflux disease)    Gout    H pylori ulcer    Hemorrhoids    Hypercholesteremia    Hypertension    IBS (irritable bowel syndrome)    Spinal stenosis     Patient Active Problem List   Diagnosis Date Noted   Duodenitis 10/29/2016   Elevated troponin 10/29/2016   Elevated uric acid in blood 12/02/2014   B12 deficiency 11/05/2014   Benign essential HTN 11/05/2014   Cramps of lower extremity 11/05/2014   Chronic LBP 11/05/2014   Chronic bronchitis ([place]) 11/05/2014   Cystitis 11/05/2014   Diverticulosis of colon 11/05/2014   Dyslipidemia 11/05/2014   Elevated erythrocyte sedimentation rate 11/05/2014   Gastro-esophageal reflux disease without esophagitis 11/05/2014   Bleeding internal hemorrhoids 11/05/2014   Disease of  spleen 11/05/2014   Perennial allergic rhinitis 11/05/2014   Obesity (BMI 30-39.9) 11/05/2014   Hemorrhage of rectum and anus 11/05/2014   Rotator cuff syndrome 11/05/2014   Leg varices 11/05/2014   Vitamin D deficiency 11/05/2014   DJD of shoulder 10/16/2014   DDD (degenerative disc disease), lumbosacral 09/18/2014   DDD (degenerative disc disease), cervical 09/18/2014   DJD (degenerative joint disease) 09/18/2014   Neuropathy due to secondary diabetes ([place]) 09/18/2014   Diabetes mellitus type 2, uncontrolled ([place]) 07/14/2014   Long term current use of insulin ([place]) 07/14/2014   Arthritis, degenerative 09/30/2013   Arthritis or polyarthritis, rheumatoid ([place]) 09/30/2013    Past Surgical History:  Procedure Laterality Date   ABDOMINAL HYSTERECTOMY     partial   BACK SURGERY     CHOLECYSTECTOMY     COLONOSCOPY WITH PROPOFOL N/A 08/18/2015   Procedure: COLONOSCOPY WITH PROPOFOL;  Surgeon: Manya Silvas, MD;  Location: [place];  Service: Endoscopy;  Laterality: N/A;   ECTOPIC PREGNANCY SURGERY     ESOPHAGOGASTRODUODENOSCOPY (EGD) WITH PROPOFOL N/A 08/18/2015   Procedure: ESOPHAGOGASTRODUODENOSCOPY (EGD) WITH PROPOFOL;  Surgeon: Manya Silvas, MD;  Location: [facility] ENDOSCOPY;  Service: Endoscopy;  Laterality: N/A;   FOOT SURGERY     NECK SURGERY     SHOULDER SURGERY Right     Prior to Admission medications   Medication Sig Start Date End Date Taking? Authorizing Provider  amLODipine (NORVASC) 10 MG tablet TAKE 1 TABLET(10 MG TOTAL) BY MOUTH DAILY.  11/08/15   Steele Sizer, MD  dicyclomine (BENTYL) 10 MG capsule Take 1 capsule (10 mg total) by mouth 4 (four) times daily for 14 days. 01/13/20 01/27/20  Shantia Sanford, Dessa Phi, FNP  fluticasone (FLONASE) 50 MCG/ACT nasal spray Place 2 sprays into both nostrils as needed for allergies. 11/06/14   Steele Sizer, MD  gabapentin (NEURONTIN) 300 MG capsule Limit 1-2 tabs by mouth 2-3 times per day if tolerated  01/03/16   Mohammed Kindle, MD  glipiZIDE (GLUCOTROL) 10 MG tablet Take 10 mg by mouth daily before breakfast.     [provider]  HYDROcodone-acetaminophen (NORCO/VICODIN) 5-325 MG tablet Limit 1 tab by mouth per day or twice a day if tolerated 10/30/16   Dustin Flock, MD  hydroxychloroquine (PLAQUENIL) 200 MG tablet Take 200 mg by mouth 2 (two) times daily.     [provider]  hydroxypropyl methylcellulose / hypromellose (ISOPTO TEARS / GONIOVISC) 2.5 % ophthalmic solution Place 1 drop into the right eye every hour as needed for dry eyes. 01/13/19   Carrie Mew, MD  insulin lispro (HUMALOG) 100 UNIT/ML injection Inject 65 Units into the skin daily. In middle of day    [provider]  insulin NPH Human (HUMULIN N,NOVOLIN N) 100 UNIT/ML injection Inject 85 Units into the skin 2 (two) times daily before a meal. 9 am and 9 pm    [provider]  losartan-hydrochlorothiazide (HYZAAR) 100-25 MG tablet Take 1 tablet by mouth daily. 10/14/16   [provider]  metFORMIN (GLUCOPHAGE-XR) 500 MG 24 hr tablet Take 500 mg by mouth daily. 09/05/16   [provider]  metoprolol succinate (TOPROL-XL) 50 MG 24 hr tablet Take 1 tablet (50 mg total) by mouth daily. Take with or immediately following a meal. 11/06/14   Ancil Boozer, Drue Stager, MD  pantoprazole (PROTONIX) 40 MG tablet Take 40 mg by mouth 2 (two) times daily. 10/17/16   [provider]  polyethylene glycol (MIRALAX) packet Take 17 g by mouth daily. 10/30/16   Dustin Flock, MD  pravastatin (PRAVACHOL) 40 MG tablet TAKE 1 TABLET BY MOUTH EVERY EVENING FOR CHOLESTEROL IN PLACE OF LIPITOR 02/05/15   Sowles, Drue Stager, MD  senna-docusate (SENOKOT-S) 8.6-50 MG tablet Take 1 tablet by mouth 2 (two) times daily. 10/30/16   Dustin Flock, MD  sertraline (ZOLOFT) 50 MG tablet Take 50 mg by mouth daily.    [provider]  sucralfate (CARAFATE) 1 G tablet Take 1 tablet by mouth 4 (four) times daily.  Reported on 11/11/2015 11/26/14   [provider]    Allergies Ace inhibitors and Sulfa antibiotics  Family History  Problem Relation Age of Onset   Diabetes Mother    Diabetes Father    Kidney disease Father    Diabetes Sister    Kidney disease Brother        Dialysis   Stroke Brother    Breast cancer Neg Hx     Social History Social History   Tobacco Use   Smoking status: Former Smoker    Packs/day: 0.50    Years: 32.00    Pack years: 16.00    Types: Cigarettes    Start date: 05/15/1972    Quit date: 09/20/2004    Years since quitting: 15.3   Smokeless tobacco: Never Used  Substance Use Topics   Alcohol use: No    Alcohol/week: 0.0 standard drinks   Drug use: No    Review of Systems  Constitutional: No fever/chills Eyes: No visual changes. ENT: No  sore throat. Cardiovascular: Denies chest pain. Respiratory: Denies shortness of breath. Gastrointestinal: Positive for left lower quadrant abdominal/pelvic pain genitourinary: Negative for dysuria. Musculoskeletal: Negative for back pain. Skin: Negative for rash. Neurological: Negative for headaches, focal weakness or numbness. ____________________________________________   PHYSICAL EXAM:  VITAL SIGNS: ED Triage Vitals  Enc Vitals Group     BP 01/13/20 1238 133/86     Pulse Rate 01/13/20 1238 79     Resp 01/13/20 1238 20     Temp 01/13/20 1238 99.4 F (37.4 C)     Temp Source 01/13/20 1238 Oral     SpO2 01/13/20 1238 98 %     Weight 01/13/20 1239 232 lb (105.2 kg)     Height 01/13/20 1239 5\' 6"  (1.676 m)     Head Circumference --      Peak Flow --      Pain Score 01/13/20 1247 8     Pain Loc --      Pain Edu? --      Excl. in [place]? --     Constitutional: Alert and oriented. Well appearing and in no acute distress. Eyes: Conjunctivae are normal. PERRL. EOMI. Head: Atraumatic. Nose: No congestion/rhinnorhea. Mouth/Throat: Mucous membranes are moist.  Oropharynx  non-erythematous. Neck: No stridor.   Hematological/Lymphatic/Immunilogical: No cervical lymphadenopathy. Cardiovascular: Normal rate, regular rhythm. Grossly normal heart sounds.  Good peripheral circulation. Respiratory: Normal respiratory effort.  No retractions. Lungs CTAB. Gastrointestinal: Soft and nontender. No distention. No abdominal bruits. No CVA tenderness. Genitourinary:  Musculoskeletal: No lower extremity tenderness nor edema.  No joint effusions. Neurologic:  Normal speech and language. No gross focal neurologic deficits are appreciated. No gait instability. Skin:  Skin is warm, dry and intact. No rash noted. Psychiatric: Mood and affect are normal. Speech and behavior are normal.  ____________________________________________   LABS (all labs ordered are listed, but only abnormal results are displayed)  Labs Reviewed  URINALYSIS, COMPLETE (UACMP) WITH MICROSCOPIC - Abnormal; Notable for the following components:      Result Value   Color, Urine YELLOW (*)    APPearance HAZY (*)    All other components within normal limits  COMPREHENSIVE METABOLIC PANEL - Abnormal; Notable for the following components:   Glucose, Bld 179 (*)    All other components within normal limits  CBC WITH DIFFERENTIAL/PLATELET   ____________________________________________  EKG  Not indicated. ____________________________________________  RADIOLOGY  ED MD interpretation:    CT of the abdomen and pelvis shows noninflamed sigmoid diverticulosis.  No acute concerns.  I, Sherrie George, personally viewed and evaluated these images (plain radiographs) as part of my medical decision making, as well as reviewing the written report by the radiologist.  Official radiology report(s): CT Renal Stone Study  Result Date: 01/13/2020 CLINICAL DATA:  Left lower quadrant pain. EXAM: CT ABDOMEN AND PELVIS WITHOUT CONTRAST TECHNIQUE: Multidetector CT imaging of the abdomen and pelvis was performed  following the standard protocol without IV contrast. COMPARISON:  October 29, 2016 FINDINGS: Lower chest: No acute abnormality. Hepatobiliary: No focal liver abnormality is seen. Status post cholecystectomy. No biliary dilatation. Pancreas: Unremarkable. No pancreatic ductal dilatation or surrounding inflammatory changes. Spleen: Normal in size without focal abnormality. Adrenals/Urinary Tract: Adrenal glands are unremarkable. Kidneys are normal, without renal calculi, focal lesion, or hydronephrosis. Bladder is unremarkable. Stomach/Bowel: Stomach is within normal limits. Appendix appears normal. No evidence of bowel dilatation. Noninflamed diverticula are seen throughout the sigmoid colon. Vascular/Lymphatic: There is mild calcification and tortuosity of the abdominal aorta and bilateral  common iliac arteries, without evidence of aneurysmal dilatation. No enlarged abdominal or pelvic lymph nodes. Reproductive: Status post hysterectomy. No adnexal masses. Other: No abdominal wall hernia or abnormality. No abdominopelvic ascites. Numerous phleboliths are seen within the lower pelvis. Musculoskeletal: Marked severity degenerative changes seen at the levels of L3-L4 and L4-L5. Postoperative changes are also seen involving the posterior elements at this level. IMPRESSION: 1. Noninflamed sigmoid diverticulosis. 2. Evidence of prior cholecystectomy and hysterectomy. 3. Aortic atherosclerosis. Aortic Atherosclerosis (ICD10-I70.0). Electronically Signed   By: Virgina Norfolk M.D.   On: 01/13/2020 18:23    ____________________________________________   PROCEDURES  Procedure(s) performed (including Critical Care):  Procedures  ____________________________________________   INITIAL IMPRESSION / ASSESSMENT AND PLAN     64 year old female presenting to the emergency department for treatment and evaluation of left lower quadrant abdominal pain.  Patient states that symptoms come in waves and it sharp pain.  See  HPI for further details.  Labs drawn while awaiting ER room as are reassuring.  Patient has had a partial hysterectomy and is not having any vaginal discharge or bleeding or concern of STI.  Symptoms and exam were concerning for kidney stone.  Plan will be to get a CT scan of the pelvis without contrast.  Patient agrees to plan.  DIFFERENTIAL DIAGNOSIS  Kidney stone, diverticulitis, ovarian cyst/mass  ED COURSE  Lab studies are reassuring.  White blood cell count is normal at 7.6.  CMP normal with the exception of elevated glucose of 179.  Patient is diabetic.  Urinalysis shows no findings of concern in either.  CT shows no acute findings.  She does have a noninflamed sigmoid diverticulosis.  Patient states that she was aware that she had diverticulosis.  She has had a colonoscopy within the past 5 years.  She will be prescribed some Bentyl and encouraged to follow-up with her primary care provider if her symptoms are not improving over the next couple of days.    ___________________________________________   FINAL CLINICAL IMPRESSION(S) / ED DIAGNOSES  Final diagnoses:  Pelvic pain in female  Diverticulosis     ED Discharge Orders         Ordered    dicyclomine (BENTYL) 10 MG capsule  4 times daily        01/13/20 1851           Jaydee Ingman Booze was evaluated in Emergency Department on 01/13/2020 for the symptoms described in the history of present illness. She was evaluated in the context of the global COVID-19 pandemic, which necessitated consideration that the patient might be at risk for infection with the SARS-CoV-2 virus that causes COVID-19. Institutional protocols and algorithms that pertain to the evaluation of patients at risk for COVID-19 are in a state of rapid change based on information released by regulatory bodies including the CDC and federal and state organizations. These policies and algorithms were followed during the patient's care in the ED.   Note:  This  document was prepared using Dragon voice recognition software and may include unintentional dictation errors.   Victorino Dike, FNP 01/13/20 1859    Carrie Mew, MD 01/13/20 2026

## 2020-04-12 DIAGNOSIS — M961 Postlaminectomy syndrome, not elsewhere classified: Secondary | ICD-10-CM | POA: Insufficient documentation

## 2020-05-19 ENCOUNTER — Other Ambulatory Visit: Payer: Self-pay | Admitting: Family Medicine

## 2020-05-19 DIAGNOSIS — Z1231 Encounter for screening mammogram for malignant neoplasm of breast: Secondary | ICD-10-CM

## 2020-05-25 ENCOUNTER — Ambulatory Visit
Admission: RE | Admit: 2020-05-25 | Discharge: 2020-05-25 | Disposition: A | Discharge status: Home / Self Care | Payer: Medicare Other | Source: Ambulatory Visit | Attending: Family Medicine | Admitting: Family Medicine

## 2020-05-25 ENCOUNTER — Other Ambulatory Visit: Payer: Self-pay

## 2020-05-25 DIAGNOSIS — Z1231 Encounter for screening mammogram for malignant neoplasm of breast: Secondary | ICD-10-CM | POA: Insufficient documentation

## 2020-05-27 ENCOUNTER — Other Ambulatory Visit: Payer: Self-pay | Admitting: Gastroenterology

## 2020-05-27 DIAGNOSIS — K760 Fatty (change of) liver, not elsewhere classified: Secondary | ICD-10-CM

## 2020-06-03 ENCOUNTER — Ambulatory Visit
Admission: RE | Admit: 2020-06-03 | Discharge: 2020-06-03 | Disposition: A | Discharge status: Home / Self Care | Payer: Medicare Other | Source: Ambulatory Visit | Attending: Gastroenterology | Admitting: Gastroenterology

## 2020-06-03 ENCOUNTER — Other Ambulatory Visit: Payer: Self-pay

## 2020-06-03 DIAGNOSIS — K760 Fatty (change of) liver, not elsewhere classified: Secondary | ICD-10-CM | POA: Diagnosis not present

## 2020-06-08 DIAGNOSIS — M533 Sacrococcygeal disorders, not elsewhere classified: Secondary | ICD-10-CM | POA: Insufficient documentation

## 2020-07-14 ENCOUNTER — Other Ambulatory Visit: Payer: Self-pay

## 2020-07-14 ENCOUNTER — Other Ambulatory Visit
Admission: RE | Admit: 2020-07-14 | Discharge: 2020-07-14 | Disposition: A | Discharge status: Home / Self Care | Payer: Medicare Other | Source: Ambulatory Visit | Attending: Gastroenterology | Admitting: Gastroenterology

## 2020-07-14 DIAGNOSIS — Z01812 Encounter for preprocedural laboratory examination: Secondary | ICD-10-CM | POA: Insufficient documentation

## 2020-07-14 DIAGNOSIS — Z20822 Contact with and (suspected) exposure to covid-19: Secondary | ICD-10-CM | POA: Insufficient documentation

## 2020-07-14 LAB — SARS CORONAVIRUS 2 (TAT 6-24 HRS): SARS Coronavirus 2: NEGATIVE

## 2020-07-15 ENCOUNTER — Encounter: Payer: Self-pay | Admitting: *Deleted

## 2020-07-16 ENCOUNTER — Ambulatory Visit: Payer: Medicare Other | Admitting: Anesthesiology

## 2020-07-16 ENCOUNTER — Ambulatory Visit
Admission: RE | Admit: 2020-07-16 | Discharge: 2020-07-16 | Disposition: A | Discharge status: Home / Self Care | Payer: Medicare Other | Attending: Gastroenterology | Admitting: Gastroenterology

## 2020-07-16 ENCOUNTER — Encounter
Admission: RE | Disposition: A | Discharge status: Home / Self Care | Payer: Self-pay | Source: Home / Self Care | Attending: Gastroenterology

## 2020-07-16 ENCOUNTER — Encounter: Payer: Self-pay | Admitting: *Deleted

## 2020-07-16 ENCOUNTER — Other Ambulatory Visit: Payer: Self-pay

## 2020-07-16 DIAGNOSIS — Z86010 Personal history of colonic polyps: Secondary | ICD-10-CM | POA: Insufficient documentation

## 2020-07-16 DIAGNOSIS — Z888 Allergy status to other drugs, medicaments and biological substances status: Secondary | ICD-10-CM | POA: Insufficient documentation

## 2020-07-16 DIAGNOSIS — K640 First degree hemorrhoids: Secondary | ICD-10-CM | POA: Diagnosis not present

## 2020-07-16 DIAGNOSIS — Z7984 Long term (current) use of oral hypoglycemic drugs: Secondary | ICD-10-CM | POA: Diagnosis not present

## 2020-07-16 DIAGNOSIS — Z791 Long term (current) use of non-steroidal anti-inflammatories (NSAID): Secondary | ICD-10-CM | POA: Diagnosis not present

## 2020-07-16 DIAGNOSIS — Z794 Long term (current) use of insulin: Secondary | ICD-10-CM | POA: Insufficient documentation

## 2020-07-16 DIAGNOSIS — M069 Rheumatoid arthritis, unspecified: Secondary | ICD-10-CM | POA: Diagnosis not present

## 2020-07-16 DIAGNOSIS — I10 Essential (primary) hypertension: Secondary | ICD-10-CM | POA: Insufficient documentation

## 2020-07-16 DIAGNOSIS — Z1211 Encounter for screening for malignant neoplasm of colon: Secondary | ICD-10-CM | POA: Diagnosis present

## 2020-07-16 DIAGNOSIS — Z79899 Other long term (current) drug therapy: Secondary | ICD-10-CM | POA: Diagnosis not present

## 2020-07-16 DIAGNOSIS — E119 Type 2 diabetes mellitus without complications: Secondary | ICD-10-CM | POA: Diagnosis not present

## 2020-07-16 DIAGNOSIS — Z7982 Long term (current) use of aspirin: Secondary | ICD-10-CM | POA: Insufficient documentation

## 2020-07-16 DIAGNOSIS — K5730 Diverticulosis of large intestine without perforation or abscess without bleeding: Secondary | ICD-10-CM | POA: Diagnosis not present

## 2020-07-16 DIAGNOSIS — Z882 Allergy status to sulfonamides status: Secondary | ICD-10-CM | POA: Diagnosis not present

## 2020-07-16 HISTORY — DX: Sleep apnea, unspecified: G47.30

## 2020-07-16 HISTORY — PX: COLONOSCOPY WITH PROPOFOL: SHX5780

## 2020-07-16 HISTORY — DX: Rheumatoid arthritis, unspecified: M06.9

## 2020-07-16 HISTORY — DX: Iron deficiency anemia, unspecified: D50.9

## 2020-07-16 HISTORY — DX: Obesity, unspecified: E66.9

## 2020-07-16 HISTORY — DX: Vitamin D deficiency, unspecified: E55.9

## 2020-07-16 HISTORY — DX: Unspecified osteoarthritis, unspecified site: M19.90

## 2020-07-16 LAB — GLUCOSE, CAPILLARY: Glucose-Capillary: 134 mg/dL — ABNORMAL HIGH (ref 70–99)

## 2020-07-16 SURGERY — COLONOSCOPY WITH PROPOFOL
Anesthesia: General

## 2020-07-16 MED ORDER — LIDOCAINE 2% (20 MG/ML) 5 ML SYRINGE
INTRAMUSCULAR | Status: DC | PRN
  Administered 2020-07-16: 25 mg via INTRAVENOUS

## 2020-07-16 MED ORDER — PROPOFOL 500 MG/50ML IV EMUL
INTRAVENOUS | Status: DC | PRN
  Administered 2020-07-16: 120 ug/kg/min via INTRAVENOUS

## 2020-07-16 MED ORDER — MIDAZOLAM HCL 5 MG/5ML IJ SOLN
INTRAMUSCULAR | Status: DC | PRN
  Administered 2020-07-16: 2 mg via INTRAVENOUS

## 2020-07-16 MED ORDER — PROPOFOL 10 MG/ML IV BOLUS
INTRAVENOUS | Status: DC | PRN
  Administered 2020-07-16: 20 mg via INTRAVENOUS
  Administered 2020-07-16: 50 mg via INTRAVENOUS

## 2020-07-16 MED ORDER — SODIUM CHLORIDE 0.9 % IV SOLN: INTRAVENOUS | Status: DC

## 2020-07-16 MED ORDER — MIDAZOLAM HCL 2 MG/2ML IJ SOLN
INTRAMUSCULAR | Status: AC
  Filled 2020-07-16: qty 2

## 2020-07-16 NOTE — Transfer of Care (Signed)
Immediate Anesthesia Transfer of Care Note  Patient: Charlotte [name]  Procedure(s) Performed: COLONOSCOPY WITH PROPOFOL (N/A )  Patient Location: Endoscopy Unit  Anesthesia Type:General  Level of Consciousness: awake and alert   Airway & Oxygen Therapy: Patient Spontanous Breathing  Post-op Assessment: Report given to RN and Post -op Vital signs reviewed and stable  Post vital signs: Reviewed  Last Vitals:  Vitals Value Taken Time  BP 111/59 07/16/20 1327  Temp    Pulse 78 07/16/20 1327  Resp 25 07/16/20 1327  SpO2 96 % 07/16/20 1327  Vitals shown include unvalidated device data.  Last Pain:  Vitals:   07/16/20 1238  TempSrc: Temporal  PainSc: 8          Complications: No complications documented.

## 2020-07-16 NOTE — Interval H&P Note (Signed)
History and Physical Interval Note:  07/16/2020 12:51 PM  Charlotte [name]  has presented today for surgery, with the diagnosis of hx of aden polyps of colon.  The various methods of treatment have been discussed with the patient and family. After consideration of risks, benefits and other options for treatment, the patient has consented to  Procedure(s): COLONOSCOPY WITH PROPOFOL (N/A) as a surgical intervention.  The patient's history has been reviewed, patient examined, no change in status, stable for surgery.  I have reviewed the patient's chart and labs.  Questions were answered to the patient's satisfaction.     Lesly Rubenstein  Ok to proceed with colonoscopy

## 2020-07-16 NOTE — H&P (Signed)
Outpatient short stay form Pre-procedure 07/16/2020 12:47 PM Charlotte Miyamoto MD, MPH  Primary Physician: Dr. Malvin Johns  Reason for visit:  Surveillance colonoscopy  History of present illness:   65 y/o lady with DM II, hypertension, and RA here for surveillance colonoscopy due to history of polyps. No family history of GI malignancies. History of hysterectomy and cholecystectomy. No blood thinners. No new GI issues.    Current Facility-Administered Medications:  .  0.9 %  sodium chloride infusion, , Intravenous, Continuous, Raekwon Winkowski, Hilton Cork, MD  Medications Prior to Admission  Medication Sig Dispense Refill Last Dose  . allopurinol (ZYLOPRIM) 100 MG tablet Take 200 mg by mouth daily.     Marland Kitchen amLODipine (NORVASC) 10 MG tablet TAKE 1 TABLET(10 MG TOTAL) BY MOUTH DAILY. 30 tablet 0 Past Week at Unknown time  . aspirin 81 MG chewable tablet Chew by mouth daily.   Past Week at Unknown time  . carvedilol (COREG) 6.25 MG tablet Take 6.25 mg by mouth 2 (two) times daily with a meal.   07/15/2020 at Unknown time  . cetirizine (ZYRTEC) 10 MG tablet Take 10 mg by mouth daily.   Past Week at Unknown time  . diclofenac Sodium (VOLTAREN) 1 % GEL Apply topically 4 (four) times daily.     . empagliflozin (JARDIANCE) 25 MG TABS tablet Take 25 mg by mouth daily.   Past Week at Unknown time  . fluticasone (FLONASE) 50 MCG/ACT nasal spray Place 2 sprays into both nostrils as needed for allergies. 16 g 2 Past Week at Unknown time  . HYDROcodone-acetaminophen (NORCO/VICODIN) 5-325 MG tablet Limit 1 tab by mouth per day or twice a day if tolerated 30 tablet 0 07/15/2020 at Unknown time  . hydroxychloroquine (PLAQUENIL) 200 MG tablet Take 200 mg by mouth 2 (two) times daily.    Past Week at Unknown time  . Insulin Glargine (BASAGLAR KWIKPEN) 100 UNIT/ML Inject 68 Units into the skin daily. 68 units AM and 75 units PM   07/15/2020 at Unknown time  . insulin lispro (HUMALOG) 100 UNIT/ML injection Inject 65 Units into the  skin daily. In middle of day   Past Week at Unknown time  . leflunomide (ARAVA) 10 MG tablet Take 10 mg by mouth daily.   Past Week at Unknown time  . losartan-hydrochlorothiazide (HYZAAR) 100-25 MG tablet Take 1 tablet by mouth daily.   07/15/2020 at Unknown time  . metFORMIN (GLUCOPHAGE-XR) 500 MG 24 hr tablet Take 500 mg by mouth daily.   Past Week at Unknown time  . omeprazole (PRILOSEC) 20 MG capsule Take 20 mg by mouth daily.   07/15/2020 at Unknown time  . pravastatin (PRAVACHOL) 40 MG tablet TAKE 1 TABLET BY MOUTH EVERY EVENING FOR CHOLESTEROL IN PLACE OF LIPITOR 90 tablet 1 Past Week at Unknown time  . spironolactone (ALDACTONE) 25 MG tablet Take 25 mg by mouth daily.   07/15/2020 at Unknown time  . topiramate (TOPAMAX) 25 MG capsule Take 25 mg by mouth 2 (two) times daily.   Past Week at Unknown time  . atorvastatin (LIPITOR) 40 MG tablet Take 40 mg by mouth daily. (Patient not taking: Reported on 07/16/2020)   Not Taking at Unknown time  . dicyclomine (BENTYL) 10 MG capsule Take 1 capsule (10 mg total) by mouth 4 (four) times daily for 14 days. 56 capsule 0   . gabapentin (NEURONTIN) 300 MG capsule Limit 1-2 tabs by mouth 2-3 times per day if tolerated 170 capsule 2   . glipiZIDE (  GLUCOTROL) 10 MG tablet Take 10 mg by mouth daily before breakfast.      . hydroxypropyl methylcellulose / hypromellose (ISOPTO TEARS / GONIOVISC) 2.5 % ophthalmic solution Place 1 drop into the right eye every hour as needed for dry eyes. 15 mL 12   . insulin NPH Human (HUMULIN N,NOVOLIN N) 100 UNIT/ML injection Inject 85 Units into the skin 2 (two) times daily before a meal. 9 am and 9 pm     . metoprolol succinate (TOPROL-XL) 50 MG 24 hr tablet Take 1 tablet (50 mg total) by mouth daily. Take with or immediately following a meal. 30 tablet 3   . pantoprazole (PROTONIX) 40 MG tablet Take 40 mg by mouth 2 (two) times daily. (Patient not taking: No sig reported)   Not Taking at Unknown time  . polyethylene glycol  (MIRALAX) packet Take 17 g by mouth daily. 14 each 0   . senna-docusate (SENOKOT-S) 8.6-50 MG tablet Take 1 tablet by mouth 2 (two) times daily. 10 tablet 0   . sertraline (ZOLOFT) 50 MG tablet Take 50 mg by mouth daily.     . sucralfate (CARAFATE) 1 G tablet Take 1 tablet by mouth 4 (four) times daily. Reported on 11/11/2015        Allergies  Allergen Reactions  . Ace Inhibitors     Other reaction(s): Unknown Pt can't remember what her reaction is and she can't remember what the name of the BP med   . Januvia [Sitagliptin]   . Liraglutide   . Sulfa Antibiotics Rash     Past Medical History:  Diagnosis Date  . DDD (degenerative disc disease), lumbar   . Depression   . Diabetes mellitus without complication ([place])   . Diverticulitis   . Fatty liver   . Fibroid tumor   . GERD (gastroesophageal reflux disease)   . Gout   . H pylori ulcer   . Hemorrhoids   . Hypercholesteremia   . Hypertension   . IBS (irritable bowel syndrome)   . IDA (iron deficiency anemia)   . OA (osteoarthritis)   . Obesity   . RA (rheumatoid arthritis) ([place])   . Sleep apnea   . Spinal stenosis   . Vitamin D deficiency     Review of systems:  Otherwise negative.    Physical Exam  Gen: Alert, oriented. Appears stated age.  HEENT: PERRLA. Lungs: No respiratory distress CV: RRR Abd: soft, benign, no masses Ext: No edema    Planned procedures: Proceed with colonoscopy. The patient understands the nature of the planned procedure, indications, risks, alternatives and potential complications including but not limited to bleeding, infection, perforation, damage to internal organs and possible oversedation/side effects from anesthesia. The patient agrees and gives consent to proceed.  Please refer to procedure notes for findings, recommendations and patient disposition/instructions.     Charlotte Miyamoto MD, MPH Gastroenterology 07/16/2020  12:47 PM

## 2020-07-16 NOTE — Anesthesia Postprocedure Evaluation (Signed)
Anesthesia Post Note  Patient: Charlotte [name]  Procedure(s) Performed: COLONOSCOPY WITH PROPOFOL (N/A )  Patient location during evaluation: Endoscopy Anesthesia Type: General Level of consciousness: awake and alert and oriented Pain management: pain level controlled Vital Signs Assessment: post-procedure vital signs reviewed and stable Respiratory status: spontaneous breathing, nonlabored ventilation and respiratory function stable Cardiovascular status: blood pressure returned to baseline and stable Postop Assessment: no signs of nausea or vomiting Anesthetic complications: no   No complications documented.   Last Vitals:  Vitals:   07/16/20 1238 07/16/20 1328  BP: 134/90   Pulse: 77   Resp: 18   Temp: 36.4 C 36.8 C  SpO2: 100%     Last Pain:  Vitals:   07/16/20 1338  TempSrc:   PainSc: 0-No pain                 Amy Penwarden

## 2020-07-16 NOTE — Op Note (Signed)
[facility] [place] [facility] Patient Name: Charlotte [name] Procedure Date: 07/16/2020 12:41 PM MRN: [id number] Account #: [id number] Date of Birth: Jan 04, 1956 Admit Type: Outpatient Age: 65 Room: [facility] ENDO ROOM 3 Gender: Female Note Status: Finalized Procedure:             Colonoscopy Indications:           High risk colon cancer surveillance: Personal history                         of colonic polyps Providers:             Andrey Farmer MD, MD Medicines:             Monitored Anesthesia Care Complications:         No immediate complications. Procedure:             Pre-Anesthesia Assessment:                        - Prior to the procedure, a History and Physical was                         performed, and patient medications and allergies were                         reviewed. The patient is competent. The risks and                         benefits of the procedure and the sedation options and                         risks were discussed with the patient. All questions                         were answered and informed consent was obtained.                         Patient identification and proposed procedure were                         verified by the physician, the nurse, the anesthetist                         and the technician in the endoscopy suite. Mental                         Status Examination: alert and oriented. Airway                         Examination: normal oropharyngeal airway and neck                         mobility. Respiratory Examination: clear to                         auscultation. CV Examination: normal. Prophylactic                         Antibiotics: The patient does not require prophylactic  antibiotics. Prior Anticoagulants: The patient has                         taken no previous anticoagulant or antiplatelet                         agents. ASA Grade Assessment: II - A patient with mild                          systemic disease. After reviewing the risks and                         benefits, the patient was deemed in satisfactory                         condition to undergo the procedure. The anesthesia                         plan was to use monitored anesthesia care (MAC).                         Immediately prior to administration of medications,                         the patient was re-assessed for adequacy to receive                         sedatives. The heart rate, respiratory rate, oxygen                         saturations, blood pressure, adequacy of pulmonary                         ventilation, and response to care were monitored                         throughout the procedure. The physical status of the                         patient was re-assessed after the procedure.                        After obtaining informed consent, the colonoscope was                         passed under direct vision. Throughout the procedure,                         the patient's blood pressure, pulse, and oxygen                         saturations were monitored continuously. The                         Colonoscope was introduced through the anus and                         advanced to the the cecum, identified by appendiceal  orifice and ileocecal valve. The colonoscopy was                         performed without difficulty. The patient tolerated                         the procedure well. The quality of the bowel                         preparation was excellent. Findings:      The perianal and digital rectal examinations were normal.      Many small and large-mouthed diverticula were found in the sigmoid colon.      Internal hemorrhoids were found during retroflexion. The hemorrhoids       were Grade I (internal hemorrhoids that do not prolapse).      The exam was otherwise without abnormality on direct and retroflexion       views. Impression:            -  Diverticulosis in the sigmoid colon.                        - Internal hemorrhoids.                        - The examination was otherwise normal on direct and                         retroflexion views.                        - No specimens collected. Recommendation:        - Discharge patient to home.                        - Resume previous diet.                        - Continue present medications.                        - Repeat colonoscopy in 10 years for surveillance.                        - Return to referring physician as previously                         scheduled. Procedure Code(s):     --- Professional ---                        F8101, Colorectal cancer screening; colonoscopy on                         individual at high risk Diagnosis Code(s):     --- Professional ---                        Z86.010, Personal history of colonic polyps                        K64.0, First degree hemorrhoids  K57.30, Diverticulosis of large intestine without                         perforation or abscess without bleeding CPT copyright 2019 [employer]. All rights reserved. The codes documented in this report are preliminary and upon coder review may  be revised to meet current compliance requirements. Andrey Farmer MD, MD 07/16/2020 1:28:32 PM Number of Addenda: 0 Note Initiated On: 07/16/2020 12:41 PM Scope Withdrawal Time: 0 hours 10 minutes 39 seconds  Total Procedure Duration: 0 hours 17 minutes 46 seconds  Estimated Blood Loss:  Estimated blood loss: none.      [facility]

## 2020-07-16 NOTE — Anesthesia Preprocedure Evaluation (Signed)
Anesthesia Evaluation  Patient identified by MRN, date of birth, ID band Patient awake    Reviewed: Allergy & Precautions, NPO status , Patient's Chart, lab work & pertinent test results  History of Anesthesia Complications Negative for: history of anesthetic complications  Airway Mallampati: III  TM Distance: >3 FB Neck ROM: Full    Dental  (+) Upper Dentures   Pulmonary sleep apnea (mild per pt, has not been prescribed CPAP) , neg COPD, former smoker,    breath sounds clear to auscultation- rhonchi (-) wheezing      Cardiovascular hypertension, Pt. on medications (-) CAD, (-) Past MI, (-) Cardiac Stents and (-) CABG  Rhythm:Regular Rate:Normal - Systolic murmurs and - Diastolic murmurs    Neuro/Psych neg Seizures PSYCHIATRIC DISORDERS Depression negative neurological ROS     GI/Hepatic Neg liver ROS, PUD, GERD  ,  Endo/Other  diabetes, Insulin Dependent  Renal/GU negative Renal ROS     Musculoskeletal  (+) Arthritis ,   Abdominal (+) + obese,   Peds  Hematology  (+) anemia ,   Anesthesia Other Findings Past Medical History: No date: DDD (degenerative disc disease), lumbar No date: Depression No date: Diabetes mellitus without complication (HCC) No date: Diverticulitis No date: Fatty liver No date: Fibroid tumor No date: GERD (gastroesophageal reflux disease) No date: Gout No date: H pylori ulcer No date: Hemorrhoids No date: Hypercholesteremia No date: Hypertension No date: IBS (irritable bowel syndrome) No date: IDA (iron deficiency anemia) No date: OA (osteoarthritis) No date: Obesity No date: RA (rheumatoid arthritis) (HCC) No date: Sleep apnea No date: Spinal stenosis No date: Vitamin D deficiency   Reproductive/Obstetrics                             Anesthesia Physical Anesthesia Plan  ASA: III  Anesthesia Plan: General   Post-op Pain Management:    Induction:  Intravenous  PONV Risk Score and Plan: 2 and Propofol infusion  Airway Management Planned: Natural Airway  Additional Equipment:   Intra-op Plan:   Post-operative Plan:   Informed Consent: I have reviewed the patients History and Physical, chart, labs and discussed the procedure including the risks, benefits and alternatives for the proposed anesthesia with the patient or authorized representative who has indicated his/her understanding and acceptance.     Dental advisory given  Plan Discussed with: CRNA and Anesthesiologist  Anesthesia Plan Comments:         Anesthesia Quick Evaluation

## 2020-07-19 ENCOUNTER — Encounter: Payer: Self-pay | Admitting: Gastroenterology

## 2020-11-26 ENCOUNTER — Emergency Department: Payer: Medicare Other

## 2020-11-26 ENCOUNTER — Emergency Department
Admission: EM | Admit: 2020-11-26 | Discharge: 2020-11-26 | Disposition: A | Discharge status: Home / Self Care | Payer: Medicare Other | Attending: Emergency Medicine | Admitting: Emergency Medicine

## 2020-11-26 ENCOUNTER — Encounter: Payer: Self-pay | Admitting: Emergency Medicine

## 2020-11-26 ENCOUNTER — Other Ambulatory Visit: Payer: Self-pay

## 2020-11-26 DIAGNOSIS — I10 Essential (primary) hypertension: Secondary | ICD-10-CM | POA: Insufficient documentation

## 2020-11-26 DIAGNOSIS — Z7984 Long term (current) use of oral hypoglycemic drugs: Secondary | ICD-10-CM | POA: Insufficient documentation

## 2020-11-26 DIAGNOSIS — Z794 Long term (current) use of insulin: Secondary | ICD-10-CM | POA: Diagnosis not present

## 2020-11-26 DIAGNOSIS — Z7982 Long term (current) use of aspirin: Secondary | ICD-10-CM | POA: Insufficient documentation

## 2020-11-26 DIAGNOSIS — E1140 Type 2 diabetes mellitus with diabetic neuropathy, unspecified: Secondary | ICD-10-CM | POA: Diagnosis not present

## 2020-11-26 DIAGNOSIS — Z87891 Personal history of nicotine dependence: Secondary | ICD-10-CM | POA: Insufficient documentation

## 2020-11-26 DIAGNOSIS — Z79899 Other long term (current) drug therapy: Secondary | ICD-10-CM | POA: Insufficient documentation

## 2020-11-26 DIAGNOSIS — R109 Unspecified abdominal pain: Secondary | ICD-10-CM | POA: Diagnosis present

## 2020-11-26 LAB — URINALYSIS, COMPLETE (UACMP) WITH MICROSCOPIC
Bacteria, UA: NONE SEEN
Bilirubin Urine: NEGATIVE
Glucose, UA: NEGATIVE mg/dL
Hgb urine dipstick: NEGATIVE
Ketones, ur: NEGATIVE mg/dL
Leukocytes,Ua: NEGATIVE
Nitrite: NEGATIVE
Protein, ur: NEGATIVE mg/dL
Specific Gravity, Urine: 1.017 (ref 1.005–1.030)
pH: 5 (ref 5.0–8.0)

## 2020-11-26 LAB — BASIC METABOLIC PANEL
Anion gap: 7 (ref 5–15)
BUN: 13 mg/dL (ref 8–23)
CO2: 28 mmol/L (ref 22–32)
Calcium: 9.3 mg/dL (ref 8.9–10.3)
Chloride: 103 mmol/L (ref 98–111)
Creatinine, Ser: 0.92 mg/dL (ref 0.44–1.00)
GFR, Estimated: >60 mL/min (ref >60)
Glucose, Bld: 265 mg/dL — ABNORMAL HIGH (ref 70–99)
Potassium: 3.7 mmol/L (ref 3.5–5.1)
Sodium: 138 mmol/L (ref 135–145)

## 2020-11-26 LAB — HEPATIC FUNCTION PANEL
ALT: 16 U/L (ref 0–44)
AST: 17 U/L (ref 15–41)
Albumin: 4 g/dL (ref 3.5–5.0)
Alkaline Phosphatase: 76 U/L (ref 38–126)
Bilirubin, Direct: <0.1 mg/dL (ref 0.0–0.2)
Total Bilirubin: 0.6 mg/dL (ref 0.3–1.2)
Total Protein: 7.6 g/dL (ref 6.5–8.1)

## 2020-11-26 LAB — CBC
HCT: 36.9 % (ref 36.0–46.0)
Hemoglobin: 12 g/dL (ref 12.0–15.0)
MCH: 27.8 pg (ref 26.0–34.0)
MCHC: 32.5 g/dL (ref 30.0–36.0)
MCV: 85.6 fL (ref 80.0–100.0)
Platelets: 341 10*3/uL (ref 150–400)
RBC: 4.31 MIL/uL (ref 3.87–5.11)
RDW: 13.2 % (ref 11.5–15.5)
WBC: 7.4 10*3/uL (ref 4.0–10.5)
nRBC: 0 % (ref 0.0–0.2)

## 2020-11-26 LAB — TROPONIN I (HIGH SENSITIVITY)
Troponin I (High Sensitivity): 17 ng/L (ref <18)
Troponin I (High Sensitivity): 18 ng/L — ABNORMAL HIGH (ref <18)

## 2020-11-26 LAB — LIPASE, BLOOD: Lipase: 21 U/L (ref 11–51)

## 2020-11-26 MED ORDER — LIDOCAINE 5 % EX PTCH
1.0000 | MEDICATED_PATCH | CUTANEOUS | Status: DC
  Administered 2020-11-26: 1 via TRANSDERMAL
  Filled 2020-11-26: qty 1

## 2020-11-26 MED ORDER — IOHEXOL 350 MG/ML SOLN
100.0000 mL | Freq: Once | INTRAVENOUS | Status: AC | PRN
  Administered 2020-11-26: 100 mL via INTRAVENOUS
  Filled 2020-11-26: qty 100

## 2020-11-26 MED ORDER — ONDANSETRON HCL 4 MG/2ML IJ SOLN
4.0000 mg | Freq: Once | INTRAMUSCULAR | Status: AC
  Administered 2020-11-26: 4 mg via INTRAVENOUS
  Filled 2020-11-26: qty 2

## 2020-11-26 MED ORDER — HYDROMORPHONE HCL 1 MG/ML IJ SOLN
0.5000 mg | Freq: Once | INTRAMUSCULAR | Status: AC
  Administered 2020-11-26: 0.5 mg via INTRAVENOUS
  Filled 2020-11-26: qty 1

## 2020-11-26 NOTE — Discharge Instructions (Addendum)
Take Tylenol 1 g every 8 hours, ibuprofen 600 every 6 hours with food.  Take the lidocaine patches to help with the pain.   Do not drive or work while on these.    Return to the ER for any worsening symptoms or any other concern   IMPRESSION: 1. No acute CT findings of the abdomen or pelvis to explain abdominal pain. 2. Sigmoid diverticulosis without evidence of acute diverticulitis. 3. Status post cholecystectomy and hysterectomy.

## 2020-11-26 NOTE — ED Provider Notes (Addendum)
[facility] Emergency Department Provider Note  ____________________________________________   Event Date/Time   First MD Initiated Contact with Patient 11/26/20 1454     (approximate)  I have reviewed the triage vital signs and the nursing notes.   HISTORY  Chief Complaint Flank Pain    HPI Charlotte [name] is a 65 y.o. female with diabetes who comes in with left flank pain.  Patient reports severe pain in her back getting worse over the past 4 days, constant, worse with movements, sharp.  Denies any urinary symptoms.  Denies any muscle strain.  States the pain wraps around into her abdomen.  Denies any shortness of breath, cough, chest pain.  Patient reports being on opioids at home for chronic back pain but this is different.  Denies any falls.  Denies any swelling in 1 leg, recent surgeries, estrogen use.  She did report a little bit of pain up in her epigastric region today as [name] but states that she thought it could just be reflux.  She denies any pleuritic component of the pain.          Past Medical History:  Diagnosis Date   DDD (degenerative disc disease), lumbar    Depression    Diabetes mellitus without complication ([place])    Diverticulitis    Fatty liver    Fibroid tumor    GERD (gastroesophageal reflux disease)    Gout    H pylori ulcer    Hemorrhoids    Hypercholesteremia    Hypertension    IBS (irritable bowel syndrome)    IDA (iron deficiency anemia)    OA (osteoarthritis)    Obesity    RA (rheumatoid arthritis) ([place])    Sleep apnea    Spinal stenosis    Vitamin D deficiency     Patient Active Problem List   Diagnosis Date Noted   Duodenitis 10/29/2016   Elevated troponin 10/29/2016   Elevated uric acid in blood 12/02/2014   B12 deficiency 11/05/2014   Benign essential HTN 11/05/2014   Cramps of lower extremity 11/05/2014   Chronic LBP 11/05/2014   Chronic bronchitis ([place]) 11/05/2014   Cystitis 11/05/2014    Diverticulosis of colon 11/05/2014   Dyslipidemia 11/05/2014   Elevated erythrocyte sedimentation rate 11/05/2014   Gastro-esophageal reflux disease without esophagitis 11/05/2014   Bleeding internal hemorrhoids 11/05/2014   Disease of spleen 11/05/2014   Perennial allergic rhinitis 11/05/2014   Obesity (BMI 30-39.9) 11/05/2014   Hemorrhage of rectum and anus 11/05/2014   Rotator cuff syndrome 11/05/2014   Leg varices 11/05/2014   Vitamin D deficiency 11/05/2014   DJD of shoulder 10/16/2014   DDD (degenerative disc disease), lumbosacral 09/18/2014   DDD (degenerative disc disease), cervical 09/18/2014   DJD (degenerative joint disease) 09/18/2014   Neuropathy due to secondary diabetes ([place]) 09/18/2014   Diabetes mellitus type 2, uncontrolled ([place]) 07/14/2014   Long term current use of insulin ([place]) 07/14/2014   Arthritis, degenerative 09/30/2013   Arthritis or polyarthritis, rheumatoid ([place]) 09/30/2013    Past Surgical History:  Procedure Laterality Date   ABDOMINAL HYSTERECTOMY     partial   BACK SURGERY     CHOLECYSTECTOMY     COLONOSCOPY WITH PROPOFOL N/A 08/18/2015   Procedure: COLONOSCOPY WITH PROPOFOL;  Surgeon: Manya Silvas, MD;  Location: [facility] ENDOSCOPY;  Service: Endoscopy;  Laterality: N/A;   COLONOSCOPY WITH PROPOFOL N/A 07/16/2020   Procedure: COLONOSCOPY WITH PROPOFOL;  Surgeon: Lesly Rubenstein, MD;  Location: [facility] ENDOSCOPY;  Service: Endoscopy;  Laterality: N/A;   ECTOPIC PREGNANCY SURGERY     ESOPHAGOGASTRODUODENOSCOPY (EGD) WITH PROPOFOL N/A 08/18/2015   Procedure: ESOPHAGOGASTRODUODENOSCOPY (EGD) WITH PROPOFOL;  Surgeon: Manya Silvas, MD;  Location: [facility] ENDOSCOPY;  Service: Endoscopy;  Laterality: N/A;   FOOT SURGERY     incision tendon sheath for trigger finger     NECK SURGERY     ROTATOR CUFF REPAIR Right    SHOULDER SURGERY Right    SPINE SURGERY     neck x2 and lumbar spine x1   uterine fibroid removed      Prior to Admission medications    Medication Sig Start Date End Date Taking? Authorizing Provider  allopurinol (ZYLOPRIM) 100 MG tablet Take 200 mg by mouth daily.    [provider]  amLODipine (NORVASC) 10 MG tablet TAKE 1 TABLET(10 MG TOTAL) BY MOUTH DAILY. 11/08/15   Steele Sizer, MD  aspirin 81 MG chewable tablet Chew by mouth daily.    [provider]  atorvastatin (LIPITOR) 40 MG tablet Take 40 mg by mouth daily. Patient not taking: Reported on 07/16/2020    [provider]  carvedilol (COREG) 6.25 MG tablet Take 6.25 mg by mouth 2 (two) times daily with a meal.    [provider]  cetirizine (ZYRTEC) 10 MG tablet Take 10 mg by mouth daily.    [provider]  diclofenac Sodium (VOLTAREN) 1 % GEL Apply topically 4 (four) times daily.    [provider]  dicyclomine (BENTYL) 10 MG capsule Take 1 capsule (10 mg total) by mouth 4 (four) times daily for 14 days. 01/13/20 01/27/20  Triplett, Johnette Abraham B, FNP  empagliflozin (JARDIANCE) 25 MG TABS tablet Take 25 mg by mouth daily.    [provider]  fluticasone (FLONASE) 50 MCG/ACT nasal spray Place 2 sprays into both nostrils as needed for allergies. 11/06/14   Steele Sizer, MD  gabapentin (NEURONTIN) 300 MG capsule Limit 1-2 tabs by mouth 2-3 times per day if tolerated 01/03/16   Mohammed Kindle, MD  glipiZIDE (GLUCOTROL) 10 MG tablet Take 10 mg by mouth daily before breakfast.     [provider]  HYDROcodone-acetaminophen (NORCO/VICODIN) 5-325 MG tablet Limit 1 tab by mouth per day or twice a day if tolerated 10/30/16   Dustin Flock, MD  hydroxychloroquine (PLAQUENIL) 200 MG tablet Take 200 mg by mouth 2 (two) times daily.     [provider]  hydroxypropyl methylcellulose / hypromellose (ISOPTO TEARS / GONIOVISC) 2.5 % ophthalmic solution Place 1 drop into the right eye every hour as needed for dry eyes. 01/13/19   Carrie Mew, MD  Insulin Glargine [facility]) 100 UNIT/ML Inject 68 Units  into the skin daily. 68 units AM and 75 units PM    [provider]  insulin lispro (HUMALOG) 100 UNIT/ML injection Inject 65 Units into the skin daily. In middle of day    [provider]  insulin NPH Human (HUMULIN N,NOVOLIN N) 100 UNIT/ML injection Inject 85 Units into the skin 2 (two) times daily before a meal. 9 am and 9 pm    [provider]  leflunomide (ARAVA) 10 MG tablet Take 10 mg by mouth daily.    [provider]  losartan-hydrochlorothiazide (HYZAAR) 100-25 MG tablet Take 1 tablet by mouth daily. 10/14/16   [provider]  metFORMIN (GLUCOPHAGE-XR) 500 MG 24 hr tablet Take 500 mg by mouth daily. 09/05/16   [provider]  metoprolol succinate (TOPROL-XL) 50 MG 24  hr tablet Take 1 tablet (50 mg total) by mouth daily. Take with or immediately following a meal. 11/06/14   Sowles, Drue Stager, MD  omeprazole (PRILOSEC) 20 MG capsule Take 20 mg by mouth daily.    [provider]  pantoprazole (PROTONIX) 40 MG tablet Take 40 mg by mouth 2 (two) times daily. Patient not taking: No sig reported 10/17/16   [provider]  polyethylene glycol (MIRALAX) packet Take 17 g by mouth daily. 10/30/16   Dustin Flock, MD  pravastatin (PRAVACHOL) 40 MG tablet TAKE 1 TABLET BY MOUTH EVERY EVENING FOR CHOLESTEROL IN PLACE OF LIPITOR 02/05/15   Sowles, Drue Stager, MD  senna-docusate (SENOKOT-S) 8.6-50 MG tablet Take 1 tablet by mouth 2 (two) times daily. 10/30/16   Dustin Flock, MD  sertraline (ZOLOFT) 50 MG tablet Take 50 mg by mouth daily.    [provider]  spironolactone (ALDACTONE) 25 MG tablet Take 25 mg by mouth daily.    [provider]  sucralfate (CARAFATE) 1 G tablet Take 1 tablet by mouth 4 (four) times daily. Reported on 11/11/2015 11/26/14   [provider]  topiramate (TOPAMAX) 25 MG capsule Take 25 mg by mouth 2 (two) times daily.    [provider]    Allergies Ace inhibitors, Januvia  [sitagliptin], Liraglutide, and Sulfa antibiotics  Family History  Problem Relation Age of Onset   Diabetes Mother    Diabetes Father    Kidney disease Father    Diabetes Sister    Kidney disease Brother        Dialysis   Stroke Brother    Breast cancer Neg Hx     Social History Social History   Tobacco Use   Smoking status: Former    Packs/day: 0.50    Years: 33.00    Pack years: 16.50    Types: Cigarettes    Start date: 05/15/1972    Quit date: 09/20/2004    Years since quitting: 16.1   Smokeless tobacco: Never  Vaping Use   Vaping Use: Never used  Substance Use Topics   Alcohol use: No    Alcohol/week: 0.0 standard drinks   Drug use: No      Review of Systems Constitutional: No fever/chills Eyes: No visual changes. ENT: No sore throat. Cardiovascular: Denies chest pain. Respiratory: Denies shortness of breath. Gastrointestinal: No abdominal pain.  No nausea, no vomiting.  No diarrhea.  No constipation. Genitourinary: Negative for dysuria. Musculoskeletal: Positive back pain Skin: Negative for rash. Neurological: Negative for headaches, focal weakness or numbness. All other ROS negative ____________________________________________   PHYSICAL EXAM:  VITAL SIGNS: ED Triage Vitals  Enc Vitals Group     BP 11/26/20 1239 133/87     Pulse Rate 11/26/20 1239 68     Resp 11/26/20 1239 20     Temp 11/26/20 1239 98.7 F (37.1 C)     Temp Source 11/26/20 1239 Oral     SpO2 11/26/20 1239 100 %     Weight 11/26/20 1237 228 lb (103.4 kg)     Height 11/26/20 1237 5\' 6"  (1.676 m)     Head Circumference --      Peak Flow --      Pain Score 11/26/20 1237 9     Pain Loc --      Pain Edu? --      Excl. in [place]? --     Constitutional: Alert and oriented. [name] appearing and in no acute distress. Eyes: Conjunctivae are normal. EOMI. Head:  Atraumatic. Nose: No congestion/rhinnorhea. Mouth/Throat: Mucous membranes are moist.   Neck: No stridor. Trachea Midline.  FROM Cardiovascular: Normal rate, regular rhythm. Grossly normal heart sounds.  Good peripheral circulation. Respiratory: Normal respiratory effort.  No retractions. Lungs CTAB. Gastrointestinal: Soft and nontender. No distention. No abdominal bruits.  Musculoskeletal: Some pain with palpation over the left flank without any redness or warmth noted.  Pain is worsened with certain movements Neurologic:  Normal speech and language. No gross focal neurologic deficits are appreciated.  Skin:  Skin is warm, dry and intact. No rash noted. Psychiatric: Mood and affect are normal. Speech and behavior are normal. GU: Deferred   ____________________________________________   LABS (all labs ordered are listed, but only abnormal results are displayed)  Labs Reviewed  URINALYSIS, COMPLETE (UACMP) WITH MICROSCOPIC - Abnormal; Notable for the following components:      Result Value   Color, Urine YELLOW (*)    APPearance CLEAR (*)    All other components within normal limits  BASIC METABOLIC PANEL - Abnormal; Notable for the following components:   Glucose, Bld 265 (*)    All other components within normal limits  CBC  HEPATIC FUNCTION PANEL  LIPASE, BLOOD  TROPONIN I (HIGH SENSITIVITY)   ____________________________________________   ED ECG REPORT I, Vanessa Rains, the attending physician, personally viewed and interpreted this ECG.  Normal sinus rate of 67, no ST elevation, no T wave inversions type I AV block ____________________________________________  RADIOLOGY Robert Bellow, personally viewed and evaluated these images (plain radiographs) as part of my medical decision making, as [name] as reviewing the written report by the radiologist.  ED MD interpretation: No pneumonia  Official radiology report(s): DG Chest 2 View  Result Date: 11/26/2020 CLINICAL DATA:  Left flank pain for 4 days EXAM: CHEST - 2 VIEW COMPARISON:  07/17/2019 FINDINGS: Frontal and lateral views of the chest  demonstrate a stable cardiac silhouette. Continued ectasia of the thoracic aorta. No acute airspace disease, effusion, or pneumothorax. No acute bony abnormalities. IMPRESSION: 1. Stable chest, no acute process. Electronically Signed   By: Randa Ngo M.D.   On: 11/26/2020 15:56   CT ABDOMEN PELVIS W CONTRAST  Result Date: 11/26/2020 CLINICAL DATA:  Abdominal distension, left flank pain for 4 days radiating to back EXAM: CT ABDOMEN AND PELVIS WITH CONTRAST TECHNIQUE: Multidetector CT imaging of the abdomen and pelvis was performed using the standard protocol following bolus administration of intravenous contrast. CONTRAST:  13mL OMNIPAQUE IOHEXOL 350 MG/ML SOLN COMPARISON:  01/13/2020 FINDINGS: Lower chest: No acute abnormality. Hepatobiliary: No focal liver abnormality is seen. Status post cholecystectomy. Mild postoperative biliary dilatation. Pancreas: Unremarkable. No pancreatic ductal dilatation or surrounding inflammatory changes. Spleen: Normal in size without significant abnormality. Adrenals/Urinary Tract: Adrenal glands are unremarkable. Kidneys are normal, without renal calculi, solid lesion, or hydronephrosis. Bladder is unremarkable. Stomach/Bowel: Stomach is within normal limits. Appendix appears normal. No evidence of bowel wall thickening, distention, or inflammatory changes. Sigmoid diverticulosis. Vascular/Lymphatic: Aortic atherosclerosis. No enlarged abdominal or pelvic lymph nodes. Reproductive: Status post hysterectomy. Other: No abdominal wall hernia or abnormality. No abdominopelvic ascites. Musculoskeletal: No acute or significant osseous findings. IMPRESSION: 1. No acute CT findings of the abdomen or pelvis to explain abdominal pain. 2. Sigmoid diverticulosis without evidence of acute diverticulitis. 3. Status post cholecystectomy and hysterectomy. Aortic Atherosclerosis (ICD10-I70.0). Electronically Signed   By: Eddie Candle M.D.   On: 11/26/2020 16:18     ____________________________________________   PROCEDURES  Procedure(s) performed (including Critical Care):  Procedures   ____________________________________________   INITIAL IMPRESSION / ASSESSMENT AND PLAN / ED COURSE  Charlotte [name] was evaluated in Emergency Department on 11/26/2020 for the symptoms described in the history of present illness. She was evaluated in the context of the global COVID-19 pandemic, which necessitated consideration that the patient might be at risk for infection with the SARS-CoV-2 virus that causes COVID-19. Institutional protocols and algorithms that pertain to the evaluation of patients at risk for COVID-19 are in a state of rapid change based on information released by regulatory bodies including the CDC and federal and state organizations. These policies and algorithms were followed during the patient's care in the ED.    Patient comes in with severe flank pain.  Given age will get CT scan to make sure no evidence of obstruction, aneurysm, mass given patient does not report any incident to cause the pain.  I considered the possibility of pulmonary embolism but she is adamant that she has no shortness of breath is not pleuritic in nature and she has no other risk factors for PE.  We will at least get a chest x-ray to make sure there is no effusion or shortly back that could be causing her the pain.  Patient did report a little bit of epigastric tenderness earlier today so we will get EKG and cardiac markers.  We will give patient 1 dose of IV Dilaudid while awaiting work-up  CT imaging was negative.  Patient has some diverticulosis without evidence of diverticulitis.  Did provide a copy of report.  Labs and work-up are otherwise reassuring.  Again I suspect this is more likely musculoskeletal for the seems to be worse with certain movements.  Troponin from 17-18 but I do not think this represents ACS.  She denies any chest pain which is some epigastric  tenderness.  Discussed with patient she feels comfortable going home.  Patient already has lidocaine patches, muscle relaxants and pain medication at home.  I encouraged her to continue taking these and she can return to the ER if things are changing or worsening.  She expressed understanding felt comfortable going home.  She stated that Dilaudid really help with her pain and that she really only has pain when she moves now.  I discussed the provisional nature of ED diagnosis, the treatment so far, the ongoing plan of care, follow up appointments and return precautions with the patient and any family or support people present. They expressed understanding and agreed with the plan, discharged home.          ____________________________________________   FINAL CLINICAL IMPRESSION(S) / ED DIAGNOSES   Final diagnoses:  Flank pain      MEDICATIONS GIVEN DURING THIS VISIT:  Medications  lidocaine (LIDODERM) 5 % 1 patch (has no administration in time range)  HYDROmorphone (DILAUDID) injection 0.5 mg (has no administration in time range)  ondansetron (ZOFRAN) injection 4 mg (has no administration in time range)     ED Discharge Orders     None        Note:  This document was prepared using Dragon voice recognition software and may include unintentional dictation errors.    Vanessa [place], MD 11/26/20 1640    Vanessa [place], MD 11/26/20 [phone]

## 2020-11-26 NOTE — ED Triage Notes (Signed)
Pt reports pain to left flank for 4 days that is now radiating into her back. Pt reports pain is sharpe in nature and constant. Pt denies urinary sx's.

## 2021-05-23 ENCOUNTER — Other Ambulatory Visit: Payer: Self-pay | Admitting: Family Medicine

## 2021-05-23 DIAGNOSIS — Z1231 Encounter for screening mammogram for malignant neoplasm of breast: Secondary | ICD-10-CM

## 2021-06-27 ENCOUNTER — Ambulatory Visit
Admission: RE | Admit: 2021-06-27 | Discharge: 2021-06-27 | Disposition: A | Discharge status: Home / Self Care | Payer: Medicare Other | Source: Ambulatory Visit | Attending: Family Medicine | Admitting: Family Medicine

## 2021-06-27 ENCOUNTER — Other Ambulatory Visit: Payer: Self-pay

## 2021-06-27 DIAGNOSIS — Z1231 Encounter for screening mammogram for malignant neoplasm of breast: Secondary | ICD-10-CM | POA: Diagnosis not present

## 2021-08-10 DIAGNOSIS — Z794 Long term (current) use of insulin: Secondary | ICD-10-CM | POA: Insufficient documentation

## 2021-09-04 ENCOUNTER — Encounter: Payer: Self-pay | Admitting: Emergency Medicine

## 2021-09-04 ENCOUNTER — Emergency Department: Payer: Medicare Other

## 2021-09-04 ENCOUNTER — Emergency Department
Admission: EM | Admit: 2021-09-04 | Discharge: 2021-09-04 | Disposition: A | Discharge status: Home / Self Care | Payer: Medicare Other | Attending: Emergency Medicine | Admitting: Emergency Medicine

## 2021-09-04 ENCOUNTER — Other Ambulatory Visit: Payer: Self-pay

## 2021-09-04 DIAGNOSIS — M7918 Myalgia, other site: Secondary | ICD-10-CM

## 2021-09-04 DIAGNOSIS — E119 Type 2 diabetes mellitus without complications: Secondary | ICD-10-CM | POA: Insufficient documentation

## 2021-09-04 DIAGNOSIS — R0782 Intercostal pain: Secondary | ICD-10-CM | POA: Insufficient documentation

## 2021-09-04 DIAGNOSIS — R1011 Right upper quadrant pain: Secondary | ICD-10-CM | POA: Insufficient documentation

## 2021-09-04 LAB — CBC WITH DIFFERENTIAL/PLATELET
Abs Immature Granulocytes: 0.02 10*3/uL (ref 0.00–0.07)
Basophils Absolute: 0 10*3/uL (ref 0.0–0.1)
Basophils Relative: 0 %
Eosinophils Absolute: 0.1 10*3/uL (ref 0.0–0.5)
Eosinophils Relative: 1 %
HCT: 35.9 % — ABNORMAL LOW (ref 36.0–46.0)
Hemoglobin: 11.6 g/dL — ABNORMAL LOW (ref 12.0–15.0)
Immature Granulocytes: 0 %
Lymphocytes Relative: 42 %
Lymphs Abs: 2.7 10*3/uL (ref 0.7–4.0)
MCH: 27 pg (ref 26.0–34.0)
MCHC: 32.3 g/dL (ref 30.0–36.0)
MCV: 83.5 fL (ref 80.0–100.0)
Monocytes Absolute: 0.4 10*3/uL (ref 0.1–1.0)
Monocytes Relative: 7 %
Neutro Abs: 3.2 10*3/uL (ref 1.7–7.7)
Neutrophils Relative %: 50 %
Platelets: 301 10*3/uL (ref 150–400)
RBC: 4.3 MIL/uL (ref 3.87–5.11)
RDW: 13.8 % (ref 11.5–15.5)
WBC: 6.5 10*3/uL (ref 4.0–10.5)
nRBC: 0 % (ref 0.0–0.2)

## 2021-09-04 LAB — LIPASE, BLOOD: Lipase: 23 U/L (ref 11–51)

## 2021-09-04 LAB — COMPREHENSIVE METABOLIC PANEL
ALT: 14 U/L (ref 0–44)
AST: 18 U/L (ref 15–41)
Albumin: 3.6 g/dL (ref 3.5–5.0)
Alkaline Phosphatase: 66 U/L (ref 38–126)
Anion gap: 6 (ref 5–15)
BUN: 13 mg/dL (ref 8–23)
CO2: 28 mmol/L (ref 22–32)
Calcium: 9 mg/dL (ref 8.9–10.3)
Chloride: 105 mmol/L (ref 98–111)
Creatinine, Ser: 0.9 mg/dL (ref 0.44–1.00)
GFR, Estimated: >60 mL/min (ref >60)
Glucose, Bld: 202 mg/dL — ABNORMAL HIGH (ref 70–99)
Potassium: 3.5 mmol/L (ref 3.5–5.1)
Sodium: 139 mmol/L (ref 135–145)
Total Bilirubin: 0.7 mg/dL (ref 0.3–1.2)
Total Protein: 6.8 g/dL (ref 6.5–8.1)

## 2021-09-04 LAB — URINALYSIS, ROUTINE W REFLEX MICROSCOPIC
Bilirubin Urine: NEGATIVE
Glucose, UA: NEGATIVE mg/dL
Hgb urine dipstick: NEGATIVE
Ketones, ur: NEGATIVE mg/dL
Leukocytes,Ua: NEGATIVE
Nitrite: NEGATIVE
Protein, ur: NEGATIVE mg/dL
Specific Gravity, Urine: 1.028 (ref 1.005–1.030)
pH: 5 (ref 5.0–8.0)

## 2021-09-04 MED ORDER — TIZANIDINE HCL 4 MG PO TABS
4.0000 mg | ORAL_TABLET | Freq: Three times a day (TID) | ORAL | 0 refills | Status: AC
Start: 2021-09-04 — End: 2021-10-04

## 2021-09-04 MED ORDER — HYDROMORPHONE HCL 1 MG/ML IJ SOLN
1.0000 mg | Freq: Once | INTRAMUSCULAR | Status: AC
  Administered 2021-09-04: 1 mg via INTRAVENOUS
  Filled 2021-09-04: qty 1

## 2021-09-04 MED ORDER — IOHEXOL 300 MG/ML  SOLN
100.0000 mL | Freq: Once | INTRAMUSCULAR | Status: AC | PRN
  Administered 2021-09-04: 100 mL via INTRAVENOUS
  Filled 2021-09-04: qty 100

## 2021-09-04 NOTE — ED Provider Notes (Signed)
? ?[facility] ?Provider Note ? ? ? Event Date/Time  ? First MD Initiated Contact with Patient 09/04/21 1043   ?  (approximate) ? ? ?History  ? ?Chief Complaint ?Rib Injury ? ? ?HPI ?Charlotte [name] is a 66 y.o. female, history of type 2 diabetes, chronic bronchitis, dyslipidemia, GERD, diverticulosis, depression, presents to the emergency department for evaluation of right-sided chest/abdominal pain.  Patient states that she has been experiencing pain along the lower right ribs for the past 4 days.  Described as a dull sensation with intermittent sharp pain, 8/10.  Denies any recent illnesses or injuries.  Denies fever/chills, shortness of breath, flank pain, abdominal pain, nausea/vomiting, diarrhea, urinary symptoms, numbness/tingling upper or lower extremities, or headache ? ?History Limitations: No limitations. ? ?    ? ? ?Physical Exam  ?Triage Vital Signs: ?ED Triage Vitals  ?Enc Vitals Group  ?   BP 09/04/21 1045 (!) 179/79  ?   Pulse Rate 09/04/21 1045 74  ?   Resp 09/04/21 1045 18  ?   Temp 09/04/21 1045 98.2 ?F (36.8 ?C)  ?   Temp Source 09/04/21 1045 Oral  ?   SpO2 09/04/21 1045 96 %  ?   Weight 09/04/21 1042 227 lb 1.2 oz (103 kg)  ?   Height 09/04/21 1042 '5\' 6"'$  (1.676 m)  ?   Head Circumference --   ?   Peak Flow --   ?   Pain Score 09/04/21 1041 8  ?   Pain Loc --   ?   Pain Edu? --   ?   Excl. in [place]? --   ? ? ?Most recent vital signs: ?Vitals:  ? 09/04/21 1439 09/04/21 1440  ?BP: (!) 149/96   ?Pulse: 66 66  ?Resp: 18 18  ?Temp:    ?SpO2: 100%   ? ? ?General: Awake, appears uncomfortable. ?Skin: Warm, dry. No rashes or lesions.  ?Eyes: PERRL. Conjunctivae normal.  ?CV: Good peripheral perfusion.  ?Resp: Normal effort.  Lung sounds are clear bilaterally in the apices and bases. ?Abd: Soft, non-tender. No distention.  ?Neuro: At baseline. No gross neurological deficits.  ? ?Focused Exam: Tenderness when palpating the 11th/12th ribs along the intercostal muscles, as well as some  discomfort when palpating the diaphragmatic/right upper quadrant region.  Negative rebound tenderness.   ? ?Physical Exam ? ? ? ?ED Results / Procedures / Treatments  ?Labs ?(all labs ordered are listed, but only abnormal results are displayed) ?Labs Reviewed  ?COMPREHENSIVE METABOLIC PANEL - Abnormal; Notable for the following components:  ?    Result Value  ? Glucose, Bld 202 (*)   ? All other components within normal limits  ?CBC WITH DIFFERENTIAL/PLATELET - Abnormal; Notable for the following components:  ? Hemoglobin 11.6 (*)   ? HCT 35.9 (*)   ? All other components within normal limits  ?URINALYSIS, ROUTINE W REFLEX MICROSCOPIC - Abnormal; Notable for the following components:  ? Color, Urine YELLOW (*)   ? APPearance CLEAR (*)   ? All other components within normal limits  ?LIPASE, BLOOD  ? ? ? ?EKG ?Not applicable. ? ? ?RADIOLOGY ? ?ED Provider Interpretation: I personally viewed and interpreted these images, nodule appreciated on the chest x-ray.  Abdominal CT shows no acute findings. ? ?DG Ribs Unilateral W/Chest Right ? ?Result Date: 09/04/2021 ?CLINICAL DATA:  Acute RIGHT chest and rib pain 4 days. No known injury. EXAM: RIGHT RIBS AND CHEST - 3+ VIEW COMPARISON:  11/26/2020 radiograph and  prior studies. 07/17/2019 chest CT FINDINGS: The cardiomediastinal silhouette is unchanged with ectatic/tortuous thoracic aorta. A RIGHT UPPER lobe nodule appears relatively unchanged. No new pulmonary opacities are identified. No pleural effusion or pneumothorax noted. No acute or suspicious bony abnormalities are present. No acute rib fracture noted. Cervical spine fusion hardware again identified. IMPRESSION: 1. No evidence of acute abnormality. No acute rib fracture. 2. Unchanged RIGHT UPPER lobe nodule. Chest CT recommended at this time (which can be done electively) per recommendation of 07/17/2019 chest CT, unless already performed at other institution. Electronically Signed   By: Margarette Canada M.D.   On:  09/04/2021 11:28  ? ?CT Abdomen Pelvis W Contrast ? ?Result Date: 09/04/2021 ?CLINICAL DATA:  Epigastric pain and right upper quadrant pain. EXAM: CT ABDOMEN AND PELVIS WITH CONTRAST TECHNIQUE: Multidetector CT imaging of the abdomen and pelvis was performed using the standard protocol following bolus administration of intravenous contrast. RADIATION DOSE REDUCTION: This exam was performed according to the departmental dose-optimization program which includes automated exposure control, adjustment of the mA and/or kV according to patient size and/or use of iterative reconstruction technique. CONTRAST:  194m OMNIPAQUE IOHEXOL 300 MG/ML  SOLN COMPARISON:  November 26, 2020 FINDINGS: Lower chest: The heart size is mildly enlarged. The lung bases are clear. Hepatobiliary: Diffuse low density of the liver without focal liver lesion is noted. Prior cholecystectomy. The biliary tree is normal. Pancreas: Unremarkable. No pancreatic ductal dilatation or surrounding inflammatory changes. Spleen: Normal in size without focal abnormality. Adrenals/Urinary Tract: Adrenal glands are unremarkable. Kidneys are normal, without renal calculi, focal lesion, or hydronephrosis. Bladder is unremarkable. Stomach/Bowel: Stomach is within normal limits. Appendix appears normal. No evidence of bowel wall thickening, distention, or inflammatory changes. There is diverticulosis of colon without diverticulitis. Vascular/Lymphatic: Aortic atherosclerosis. No enlarged abdominal or pelvic lymph nodes. Reproductive: Status post hysterectomy. No adnexal masses. Other: Bilateral inguinal herniation of mesenteric fat is identified unchanged. Musculoskeletal: Degenerative joint changes of the spine are noted. IMPRESSION: 1. No acute abnormality identified in the abdomen and pelvis. 2. Fatty infiltration of liver. 3. Diverticulosis of colon without diverticulitis. Aortic Atherosclerosis (ICD10-I70.0). Electronically Signed   By: WAbelardo DieselM.D.   On:  09/04/2021 14:56   ? ?PROCEDURES: ? ?Critical Care performed: None. ? ?Procedures ? ? ? ?MEDICATIONS ORDERED IN ED: ?Medications  ?HYDROmorphone (DILAUDID) injection 1 mg (1 mg Intravenous Given 09/04/21 1240)  ?iohexol (OMNIPAQUE) 300 MG/ML solution 100 mL (100 mLs Intravenous Contrast Given 09/04/21 1418)  ? ? ? ?IMPRESSION / MDM / ASSESSMENT AND PLAN / ED COURSE  ?I reviewed the triage vital signs and the nursing notes. ?             ?               ? ?Differential diagnosis includes, but is not limited to, intercostal muscle strain, rib fracture, pancreatitis, hepatitis, cholangitis, diaphragmatic injury ? ?ED Course ?Patient appears uncomfortable, but stable.  Currently endorsing moderate pain.  We will treat with IV Dilaudid. ? ?CBC shows no leukocytosis or clinically significant anemia.  CMP unremarkable for kidney injury, transaminitis, or significant electrolyte abnormalities.  Lipase unremarkable. ? ?Urinalysis shows no evidence of infection. ? ?Assessment/Plan ?Patient presents with right-sided rib/right upper quadrant abdominal pain.  Lab work has been reassuring.  No acute pathology noted on CT scan or chest x-ray.  Unclear etiology at this time, however patient does state on reexamination that she has had this sensation over on the left side before a few  months ago, for which muscle relaxers have helped for.  It is possible that she is having intercostal muscle pain.  We will trial a course of tizanidine, as this was prescribed to her the last time.  Recommend that she follow-up with her primary care provider as needed.  We will plan to discharge. ? ?Considered admission for this patient, but given her stable presentation and reassuring lab work-up/imaging, she is unlikely to benefit from admission.   ? ?Provided the patient with anticipatory guidance, return precautions, and educational material. Encouraged the patient to return to the emergency department at any time if they begin to experience any new  or worsening symptoms. Patient expressed understanding and agreed with the plan.  ? ?  ? ? ?FINAL CLINICAL IMPRESSION(S) / ED DIAGNOSES  ? ?Final diagnoses:  ?Intercostal muscle pain  ? ? ? ?Rx / DC Orders  ? ?ED D

## 2021-09-04 NOTE — ED Notes (Signed)
66 yof with a c/c of right sided rib pain for the past four days.  ?

## 2021-09-04 NOTE — Discharge Instructions (Addendum)
-  Take the tizanidine as prescribed. ?-You may continue taking your other pain medications as needed. ?-Follow-up with your primary care provider as needed. ?-Return to the emergency department anytime if you begin to experience any new or worsening symptoms ?

## 2021-09-04 NOTE — ED Triage Notes (Signed)
Pt reports pain to her right ribs for the past 4 days. Pt denies obvious injuries, denies cough, denies SOB and all other sx's. Pt reports hurts worse when she lays on her right side ?

## 2021-09-04 NOTE — ED Notes (Signed)
Pt states she did not take her BP meds this morning. ?

## 2021-09-14 DIAGNOSIS — Z794 Long term (current) use of insulin: Secondary | ICD-10-CM | POA: Diagnosis not present

## 2021-09-14 DIAGNOSIS — Z8639 Personal history of other endocrine, nutritional and metabolic disease: Secondary | ICD-10-CM | POA: Diagnosis not present

## 2021-09-14 DIAGNOSIS — E1165 Type 2 diabetes mellitus with hyperglycemia: Secondary | ICD-10-CM | POA: Diagnosis not present

## 2021-09-14 DIAGNOSIS — E8881 Metabolic syndrome: Secondary | ICD-10-CM | POA: Diagnosis not present

## 2021-10-07 DIAGNOSIS — R1012 Left upper quadrant pain: Secondary | ICD-10-CM | POA: Diagnosis not present

## 2021-10-07 DIAGNOSIS — M62830 Muscle spasm of back: Secondary | ICD-10-CM | POA: Diagnosis not present

## 2021-10-11 DIAGNOSIS — M5030 Other cervical disc degeneration, unspecified cervical region: Secondary | ICD-10-CM | POA: Diagnosis not present

## 2021-10-11 DIAGNOSIS — M5451 Vertebrogenic low back pain: Secondary | ICD-10-CM | POA: Diagnosis not present

## 2021-10-11 DIAGNOSIS — G894 Chronic pain syndrome: Secondary | ICD-10-CM | POA: Diagnosis not present

## 2021-10-17 DIAGNOSIS — Z794 Long term (current) use of insulin: Secondary | ICD-10-CM | POA: Diagnosis not present

## 2021-10-17 DIAGNOSIS — Z6835 Body mass index (BMI) 35.0-35.9, adult: Secondary | ICD-10-CM | POA: Diagnosis not present

## 2021-10-17 DIAGNOSIS — Z79899 Other long term (current) drug therapy: Secondary | ICD-10-CM | POA: Diagnosis not present

## 2021-10-17 DIAGNOSIS — I10 Essential (primary) hypertension: Secondary | ICD-10-CM | POA: Diagnosis not present

## 2021-10-17 DIAGNOSIS — Z7984 Long term (current) use of oral hypoglycemic drugs: Secondary | ICD-10-CM | POA: Diagnosis not present

## 2021-10-17 DIAGNOSIS — K859 Acute pancreatitis without necrosis or infection, unspecified: Secondary | ICD-10-CM | POA: Diagnosis not present

## 2021-10-17 DIAGNOSIS — E1165 Type 2 diabetes mellitus with hyperglycemia: Secondary | ICD-10-CM | POA: Diagnosis not present

## 2021-10-17 DIAGNOSIS — Z7952 Long term (current) use of systemic steroids: Secondary | ICD-10-CM | POA: Diagnosis not present

## 2021-10-17 DIAGNOSIS — E1142 Type 2 diabetes mellitus with diabetic polyneuropathy: Secondary | ICD-10-CM | POA: Diagnosis not present

## 2021-10-31 ENCOUNTER — Other Ambulatory Visit
Admission: RE | Admit: 2021-10-31 | Discharge: 2021-10-31 | Disposition: A | Discharge status: Home / Self Care | Payer: Medicare Other | Source: Ambulatory Visit | Attending: Gastroenterology | Admitting: Gastroenterology

## 2021-10-31 DIAGNOSIS — K2100 Gastro-esophageal reflux disease with esophagitis, without bleeding: Secondary | ICD-10-CM | POA: Diagnosis not present

## 2021-10-31 DIAGNOSIS — G4733 Obstructive sleep apnea (adult) (pediatric): Secondary | ICD-10-CM | POA: Diagnosis not present

## 2021-10-31 DIAGNOSIS — R002 Palpitations: Secondary | ICD-10-CM | POA: Diagnosis not present

## 2021-10-31 DIAGNOSIS — I8393 Asymptomatic varicose veins of bilateral lower extremities: Secondary | ICD-10-CM | POA: Diagnosis not present

## 2021-10-31 DIAGNOSIS — R079 Chest pain, unspecified: Secondary | ICD-10-CM | POA: Insufficient documentation

## 2021-10-31 DIAGNOSIS — R0602 Shortness of breath: Secondary | ICD-10-CM | POA: Diagnosis not present

## 2021-10-31 DIAGNOSIS — I10 Essential (primary) hypertension: Secondary | ICD-10-CM | POA: Diagnosis not present

## 2021-10-31 DIAGNOSIS — R0789 Other chest pain: Secondary | ICD-10-CM | POA: Diagnosis not present

## 2021-10-31 LAB — TROPONIN I (HIGH SENSITIVITY): Troponin I (High Sensitivity): 21 ng/L — ABNORMAL HIGH (ref <18)

## 2021-11-10 DIAGNOSIS — Z87891 Personal history of nicotine dependence: Secondary | ICD-10-CM | POA: Diagnosis not present

## 2021-11-10 DIAGNOSIS — G479 Sleep disorder, unspecified: Secondary | ICD-10-CM | POA: Diagnosis not present

## 2021-11-10 DIAGNOSIS — R911 Solitary pulmonary nodule: Secondary | ICD-10-CM | POA: Diagnosis not present

## 2021-11-22 DIAGNOSIS — R0602 Shortness of breath: Secondary | ICD-10-CM | POA: Diagnosis not present

## 2021-11-24 DIAGNOSIS — R0602 Shortness of breath: Secondary | ICD-10-CM | POA: Diagnosis not present

## 2021-11-24 DIAGNOSIS — G479 Sleep disorder, unspecified: Secondary | ICD-10-CM | POA: Diagnosis not present

## 2021-11-24 DIAGNOSIS — R911 Solitary pulmonary nodule: Secondary | ICD-10-CM | POA: Diagnosis not present

## 2021-12-08 DIAGNOSIS — R002 Palpitations: Secondary | ICD-10-CM | POA: Diagnosis not present

## 2021-12-08 DIAGNOSIS — R0602 Shortness of breath: Secondary | ICD-10-CM | POA: Diagnosis not present

## 2021-12-10 DIAGNOSIS — G4733 Obstructive sleep apnea (adult) (pediatric): Secondary | ICD-10-CM | POA: Diagnosis not present

## 2021-12-15 ENCOUNTER — Other Ambulatory Visit
Admission: RE | Admit: 2021-12-15 | Discharge: 2021-12-15 | Disposition: A | Discharge status: Home / Self Care | Payer: Medicare Other | Source: Ambulatory Visit | Attending: Internal Medicine | Admitting: Internal Medicine

## 2021-12-15 DIAGNOSIS — Z01812 Encounter for preprocedural laboratory examination: Secondary | ICD-10-CM | POA: Insufficient documentation

## 2021-12-15 DIAGNOSIS — Z01810 Encounter for preprocedural cardiovascular examination: Secondary | ICD-10-CM | POA: Insufficient documentation

## 2021-12-15 DIAGNOSIS — I10 Essential (primary) hypertension: Secondary | ICD-10-CM | POA: Diagnosis not present

## 2021-12-15 DIAGNOSIS — R0602 Shortness of breath: Secondary | ICD-10-CM | POA: Insufficient documentation

## 2021-12-15 DIAGNOSIS — I201 Angina pectoris with documented spasm: Secondary | ICD-10-CM | POA: Diagnosis not present

## 2021-12-15 DIAGNOSIS — Z79899 Other long term (current) drug therapy: Secondary | ICD-10-CM | POA: Insufficient documentation

## 2021-12-15 LAB — BRAIN NATRIURETIC PEPTIDE: B Natriuretic Peptide: 77.3 pg/mL (ref 0.0–100.0)

## 2021-12-30 DIAGNOSIS — G4733 Obstructive sleep apnea (adult) (pediatric): Secondary | ICD-10-CM | POA: Diagnosis not present

## 2022-01-03 DIAGNOSIS — Z7960 Long term (current) use of unspecified immunomodulators and immunosuppressants: Secondary | ICD-10-CM | POA: Diagnosis not present

## 2022-01-03 DIAGNOSIS — M1A09X0 Idiopathic chronic gout, multiple sites, without tophus (tophi): Secondary | ICD-10-CM | POA: Diagnosis not present

## 2022-01-03 DIAGNOSIS — M0609 Rheumatoid arthritis without rheumatoid factor, multiple sites: Secondary | ICD-10-CM | POA: Diagnosis not present

## 2022-01-03 DIAGNOSIS — M47816 Spondylosis without myelopathy or radiculopathy, lumbar region: Secondary | ICD-10-CM | POA: Diagnosis not present

## 2022-01-09 DIAGNOSIS — M5030 Other cervical disc degeneration, unspecified cervical region: Secondary | ICD-10-CM | POA: Diagnosis not present

## 2022-01-09 DIAGNOSIS — M5481 Occipital neuralgia: Secondary | ICD-10-CM | POA: Diagnosis not present

## 2022-01-09 DIAGNOSIS — G894 Chronic pain syndrome: Secondary | ICD-10-CM | POA: Diagnosis not present

## 2022-01-09 DIAGNOSIS — M5136 Other intervertebral disc degeneration, lumbar region: Secondary | ICD-10-CM | POA: Diagnosis not present

## 2022-01-09 DIAGNOSIS — M5451 Vertebrogenic low back pain: Secondary | ICD-10-CM | POA: Diagnosis not present

## 2022-01-15 DIAGNOSIS — M109 Gout, unspecified: Secondary | ICD-10-CM | POA: Diagnosis not present

## 2022-01-17 ENCOUNTER — Ambulatory Visit
Admission: RE | Admit: 2022-01-17 | Discharge: 2022-01-17 | Disposition: A | Discharge status: Home / Self Care | Payer: Medicare Other | Attending: Gastroenterology | Admitting: Gastroenterology

## 2022-01-17 ENCOUNTER — Ambulatory Visit: Payer: Medicare Other | Admitting: Anesthesiology

## 2022-01-17 ENCOUNTER — Encounter
Admission: RE | Disposition: A | Discharge status: Home / Self Care | Payer: Self-pay | Source: Home / Self Care | Attending: Gastroenterology

## 2022-01-17 DIAGNOSIS — M069 Rheumatoid arthritis, unspecified: Secondary | ICD-10-CM | POA: Diagnosis not present

## 2022-01-17 DIAGNOSIS — K589 Irritable bowel syndrome without diarrhea: Secondary | ICD-10-CM | POA: Insufficient documentation

## 2022-01-17 DIAGNOSIS — G4730 Sleep apnea, unspecified: Secondary | ICD-10-CM | POA: Insufficient documentation

## 2022-01-17 DIAGNOSIS — R1013 Epigastric pain: Secondary | ICD-10-CM | POA: Insufficient documentation

## 2022-01-17 DIAGNOSIS — Z8711 Personal history of peptic ulcer disease: Secondary | ICD-10-CM | POA: Diagnosis not present

## 2022-01-17 DIAGNOSIS — K2960 Other gastritis without bleeding: Secondary | ICD-10-CM | POA: Diagnosis not present

## 2022-01-17 DIAGNOSIS — K449 Diaphragmatic hernia without obstruction or gangrene: Secondary | ICD-10-CM | POA: Diagnosis not present

## 2022-01-17 DIAGNOSIS — Z87891 Personal history of nicotine dependence: Secondary | ICD-10-CM | POA: Diagnosis not present

## 2022-01-17 DIAGNOSIS — K219 Gastro-esophageal reflux disease without esophagitis: Secondary | ICD-10-CM | POA: Insufficient documentation

## 2022-01-17 DIAGNOSIS — Z794 Long term (current) use of insulin: Secondary | ICD-10-CM | POA: Diagnosis not present

## 2022-01-17 DIAGNOSIS — F32A Depression, unspecified: Secondary | ICD-10-CM | POA: Diagnosis not present

## 2022-01-17 DIAGNOSIS — Z79899 Other long term (current) drug therapy: Secondary | ICD-10-CM | POA: Insufficient documentation

## 2022-01-17 DIAGNOSIS — Z6835 Body mass index (BMI) 35.0-35.9, adult: Secondary | ICD-10-CM | POA: Diagnosis not present

## 2022-01-17 DIAGNOSIS — E7800 Pure hypercholesterolemia, unspecified: Secondary | ICD-10-CM | POA: Insufficient documentation

## 2022-01-17 DIAGNOSIS — I10 Essential (primary) hypertension: Secondary | ICD-10-CM | POA: Diagnosis not present

## 2022-01-17 DIAGNOSIS — E669 Obesity, unspecified: Secondary | ICD-10-CM | POA: Diagnosis not present

## 2022-01-17 DIAGNOSIS — K2970 Gastritis, unspecified, without bleeding: Secondary | ICD-10-CM | POA: Diagnosis not present

## 2022-01-17 DIAGNOSIS — E119 Type 2 diabetes mellitus without complications: Secondary | ICD-10-CM | POA: Insufficient documentation

## 2022-01-17 DIAGNOSIS — M1990 Unspecified osteoarthritis, unspecified site: Secondary | ICD-10-CM | POA: Diagnosis not present

## 2022-01-17 DIAGNOSIS — Z7984 Long term (current) use of oral hypoglycemic drugs: Secondary | ICD-10-CM | POA: Insufficient documentation

## 2022-01-17 DIAGNOSIS — K3189 Other diseases of stomach and duodenum: Secondary | ICD-10-CM | POA: Diagnosis not present

## 2022-01-17 DIAGNOSIS — M109 Gout, unspecified: Secondary | ICD-10-CM | POA: Insufficient documentation

## 2022-01-17 HISTORY — PX: ESOPHAGOGASTRODUODENOSCOPY (EGD) WITH PROPOFOL: SHX5813

## 2022-01-17 LAB — GLUCOSE, CAPILLARY: Glucose-Capillary: 143 mg/dL — ABNORMAL HIGH (ref 70–99)

## 2022-01-17 SURGERY — ESOPHAGOGASTRODUODENOSCOPY (EGD) WITH PROPOFOL
Anesthesia: General

## 2022-01-17 MED ORDER — PROPOFOL 10 MG/ML IV BOLUS
INTRAVENOUS | Status: AC
  Filled 2022-01-17: qty 20

## 2022-01-17 MED ORDER — DEXMEDETOMIDINE HCL IN NACL 200 MCG/50ML IV SOLN
INTRAVENOUS | Status: DC | PRN
  Administered 2022-01-17: 8 ug via INTRAVENOUS

## 2022-01-17 MED ORDER — LIDOCAINE HCL (CARDIAC) PF 100 MG/5ML IV SOSY
PREFILLED_SYRINGE | INTRAVENOUS | Status: DC | PRN
  Administered 2022-01-17: 50 mg via INTRAVENOUS

## 2022-01-17 MED ORDER — SODIUM CHLORIDE 0.9 % IV SOLN: INTRAVENOUS | Status: DC

## 2022-01-17 MED ORDER — PROPOFOL 10 MG/ML IV BOLUS
INTRAVENOUS | Status: DC | PRN
  Administered 2022-01-17: 80 mg via INTRAVENOUS
  Administered 2022-01-17 (×2): 20 mg via INTRAVENOUS

## 2022-01-17 NOTE — H&P (Signed)
Outpatient short stay form Pre-procedure 01/17/2022  Lesly Rubenstein, MD  Primary Physician: Henrietta Hoover, MD  Reason for visit:  Epigastric pain  History of present illness:    66 y/o lady with history of obesity, hypertension, and DM II here for EGD for epigastric pain. No blood thinners. No family history of GI malignancies. No dysphagia. History of neck surgeries.    Current Facility-Administered Medications:    0.9 %  sodium chloride infusion, , Intravenous, Continuous, Khaliq Turay, Hilton Cork, MD  Medications Prior to Admission  Medication Sig Dispense Refill Last Dose   amLODipine (NORVASC) 10 MG tablet TAKE 1 TABLET(10 MG TOTAL) BY MOUTH DAILY. 30 tablet 0 01/16/2022   carvedilol (COREG) 6.25 MG tablet Take 6.25 mg by mouth 2 (two) times daily with a meal.   01/16/2022   Insulin Glargine (BASAGLAR KWIKPEN) 100 UNIT/ML Inject 68 Units into the skin daily. 68 units AM and 75 units PM   01/16/2022   insulin lispro (HUMALOG) 100 UNIT/ML injection Inject 65 Units into the skin daily. In middle of day   01/16/2022   losartan-hydrochlorothiazide (HYZAAR) 100-25 MG tablet Take 1 tablet by mouth daily.   01/17/2022   metoprolol succinate (TOPROL-XL) 50 MG 24 hr tablet Take 1 tablet (50 mg total) by mouth daily. Take with or immediately following a meal. 30 tablet 3 01/16/2022   spironolactone (ALDACTONE) 25 MG tablet Take 25 mg by mouth daily.   01/17/2022   allopurinol (ZYLOPRIM) 100 MG tablet Take 200 mg by mouth daily.      aspirin 81 MG chewable tablet Chew by mouth daily.      atorvastatin (LIPITOR) 40 MG tablet Take 40 mg by mouth daily. (Patient not taking: Reported on 07/16/2020)      cetirizine (ZYRTEC) 10 MG tablet Take 10 mg by mouth daily.      diclofenac Sodium (VOLTAREN) 1 % GEL Apply topically 4 (four) times daily.      dicyclomine (BENTYL) 10 MG capsule Take 1 capsule (10 mg total) by mouth 4 (four) times daily for 14 days. 56 capsule 0    empagliflozin (JARDIANCE) 25 MG  TABS tablet Take 25 mg by mouth daily.      fluticasone (FLONASE) 50 MCG/ACT nasal spray Place 2 sprays into both nostrils as needed for allergies. 16 g 2    gabapentin (NEURONTIN) 300 MG capsule Limit 1-2 tabs by mouth 2-3 times per day if tolerated 170 capsule 2    glipiZIDE (GLUCOTROL) 10 MG tablet Take 10 mg by mouth daily before breakfast.       HYDROcodone-acetaminophen (NORCO/VICODIN) 5-325 MG tablet Limit 1 tab by mouth per day or twice a day if tolerated 30 tablet 0    hydroxychloroquine (PLAQUENIL) 200 MG tablet Take 200 mg by mouth 2 (two) times daily.       hydroxypropyl methylcellulose / hypromellose (ISOPTO TEARS / GONIOVISC) 2.5 % ophthalmic solution Place 1 drop into the right eye every hour as needed for dry eyes. 15 mL 12    insulin NPH Human (HUMULIN N,NOVOLIN N) 100 UNIT/ML injection Inject 85 Units into the skin 2 (two) times daily before a meal. 9 am and 9 pm (Patient not taking: Reported on 01/17/2022)   Not Taking   leflunomide (ARAVA) 10 MG tablet Take 10 mg by mouth daily.      metFORMIN (GLUCOPHAGE-XR) 500 MG 24 hr tablet Take 500 mg by mouth daily.      omeprazole (PRILOSEC) 20 MG capsule Take 20  mg by mouth daily.      pantoprazole (PROTONIX) 40 MG tablet Take 40 mg by mouth 2 (two) times daily. (Patient not taking: No sig reported)      polyethylene glycol (MIRALAX) packet Take 17 g by mouth daily. 14 each 0    pravastatin (PRAVACHOL) 40 MG tablet TAKE 1 TABLET BY MOUTH EVERY EVENING FOR CHOLESTEROL IN PLACE OF LIPITOR 90 tablet 1    senna-docusate (SENOKOT-S) 8.6-50 MG tablet Take 1 tablet by mouth 2 (two) times daily. 10 tablet 0    sertraline (ZOLOFT) 50 MG tablet Take 50 mg by mouth daily.      sucralfate (CARAFATE) 1 G tablet Take 1 tablet by mouth 4 (four) times daily. Reported on 11/11/2015      topiramate (TOPAMAX) 25 MG capsule Take 25 mg by mouth 2 (two) times daily.        Allergies  Allergen Reactions   Ace Inhibitors     Other reaction(s): Unknown Pt  can't remember what her reaction is and she can't remember what the name of the BP med    Januvia [Sitagliptin]    Liraglutide    Sulfa Antibiotics Rash     Past Medical History:  Diagnosis Date   DDD (degenerative disc disease), lumbar    Depression    Diabetes mellitus without complication (HCC)    Diverticulitis    Fatty liver    Fibroid tumor    GERD (gastroesophageal reflux disease)    Gout    H pylori ulcer    Hemorrhoids    Hypercholesteremia    Hypertension    IBS (irritable bowel syndrome)    IDA (iron deficiency anemia)    OA (osteoarthritis)    Obesity    RA (rheumatoid arthritis) ([place])    Sleep apnea    Spinal stenosis    Vitamin D deficiency     Review of systems:  Otherwise negative.    Physical Exam  Gen: Alert, oriented. Appears stated age.  HEENT: PERRLA. Lungs: No respiratory distress CV: RRR Abd: soft, benign, no masses Ext: No edema    Planned procedures: Proceed with EGD. The patient understands the nature of the planned procedure, indications, risks, alternatives and potential complications including but not limited to bleeding, infection, perforation, damage to internal organs and possible oversedation/side effects from anesthesia. The patient agrees and gives consent to proceed.  Please refer to procedure notes for findings, recommendations and patient disposition/instructions.     Lesly Rubenstein, MD [facility]

## 2022-01-17 NOTE — Op Note (Signed)
[facility] Patient Name: Charlotte [name] Procedure Date: 01/17/2022 8:49 AM MRN: [id number] Account #: [id number] Date of Birth: 1955-11-07 Admit Type: Outpatient Age: 66 Room: [facility] ENDO ROOM 1 Gender: Female Note Status: Finalized Instrument Name: Upper Endoscope [id number] Procedure:             Upper GI endoscopy Indications:           Epigastric abdominal pain, Gastro-esophageal reflux                         disease Providers:             Andrey Farmer MD, MD Referring MD:          Andrey Farmer MD, MD (Referring MD) Medicines:             Monitored Anesthesia Care Complications:         No immediate complications. Estimated blood loss:                         Minimal. Procedure:             Pre-Anesthesia Assessment:                        - Prior to the procedure, a History and Physical was                         performed, and patient medications and allergies were                         reviewed. The patient is competent. The risks and                         benefits of the procedure and the sedation options and                         risks were discussed with the patient. All questions                         were answered and informed consent was obtained.                         Patient identification and proposed procedure were                         verified by the physician, the nurse, the                         anesthesiologist, the anesthetist and the technician                         in the endoscopy suite. Mental Status Examination:                         alert and oriented. Airway Examination: normal                         oropharyngeal airway and neck mobility. Respiratory  Examination: clear to auscultation. CV Examination:                         normal. Prophylactic Antibiotics: The patient does not                         require prophylactic antibiotics. Prior                          Anticoagulants: The patient has taken no previous                         anticoagulant or antiplatelet agents. ASA Grade                         Assessment: III - A patient with severe systemic                         disease. After reviewing the risks and benefits, the                         patient was deemed in satisfactory condition to                         undergo the procedure. The anesthesia plan was to use                         monitored anesthesia care (MAC). Immediately prior to                         administration of medications, the patient was                         re-assessed for adequacy to receive sedatives. The                         heart rate, respiratory rate, oxygen saturations,                         blood pressure, adequacy of pulmonary ventilation, and                         response to care were monitored throughout the                         procedure. The physical status of the patient was                         re-assessed after the procedure.                        After obtaining informed consent, the endoscope was                         passed under direct vision. Throughout the procedure,                         the patient's blood pressure, pulse, and oxygen  saturations were monitored continuously. The Endoscope                         was introduced through the mouth, and advanced to the                         second part of duodenum. The upper GI endoscopy was                         accomplished without difficulty. The patient tolerated                         the procedure well. Findings:      A small hiatal hernia was present.      The exam of the esophagus was otherwise normal.      Patchy mild inflammation characterized by erythema was found in the       gastric antrum. Biopsies were taken with a cold forceps for Helicobacter       pylori testing. Estimated blood loss was minimal.      The examined duodenum  was normal. Impression:            - Small hiatal hernia.                        - Gastritis. Biopsied.                        - Normal examined duodenum. Recommendation:        - Discharge patient to home.                        - Resume previous diet.                        - Continue present medications.                        - Await pathology results.                        - Return to referring physician as previously                         scheduled. Procedure Code(s):     --- Professional ---                        [phone], Esophagogastroduodenoscopy, flexible,                         transoral; with biopsy, single or multiple Diagnosis Code(s):     --- Professional ---                        K44.9, Diaphragmatic hernia without obstruction or                         gangrene                        K29.70, Gastritis, unspecified, without bleeding  R10.13, Epigastric pain                        K21.9, Gastro-esophageal reflux disease without                         esophagitis CPT copyright 2019 American Medical Association. All rights reserved. The codes documented in this report are preliminary and upon coder review may  be revised to meet current compliance requirements. Andrey Farmer MD, MD 01/17/2022 9:32:30 AM Number of Addenda: 0 Note Initiated On: 01/17/2022 8:49 AM Estimated Blood Loss:  Estimated blood loss was minimal.      [facility]

## 2022-01-17 NOTE — Transfer of Care (Signed)
Immediate Anesthesia Transfer of Care Note  Patient: Charlotte [name]  Procedure(s) Performed: ESOPHAGOGASTRODUODENOSCOPY (EGD) WITH PROPOFOL  Patient Location: PACU and Endoscopy Unit  Anesthesia Type:General  Level of Consciousness: drowsy and patient cooperative  Airway & Oxygen Therapy: Patient Spontanous Breathing  Post-op Assessment: Report given to RN and Post -op Vital signs reviewed and stable  Post vital signs: Reviewed and stable  Last Vitals:  Vitals Value Taken Time  BP 110/63 01/17/22 0936  Temp    Pulse 66 01/17/22 0935  Resp 16 01/17/22 0936  SpO2 98 % 01/17/22 0936  Vitals shown include unvalidated device data.  Last Pain:  Vitals:   01/17/22 0903  TempSrc: Temporal  PainSc: 5          Complications: No notable events documented.

## 2022-01-17 NOTE — Anesthesia Preprocedure Evaluation (Signed)
Anesthesia Evaluation  Patient identified by MRN, date of birth, ID band Patient awake    Reviewed: Allergy & Precautions, H&P , NPO status , Patient's Chart, lab work & pertinent test results, reviewed documented beta blocker date and time   Airway Mallampati: II   Neck ROM: full    Dental  (+) Poor Dentition   Pulmonary sleep apnea and Continuous Positive Airway Pressure Ventilation , former smoker,    Pulmonary exam normal        Cardiovascular Exercise Tolerance: Good hypertension, On Medications negative cardio ROS Normal cardiovascular exam Rhythm:regular Rate:Normal     Neuro/Psych Depression negative neurological ROS  negative psych ROS   GI/Hepatic Neg liver ROS, PUD, GERD  Medicated,  Endo/Other  negative endocrine ROSdiabetes, Well Controlled, Type 2, Oral Hypoglycemic Agents  Renal/GU negative Renal ROS  negative genitourinary   Musculoskeletal   Abdominal   Peds  Hematology  (+) Blood dyscrasia, anemia ,   Anesthesia Other Findings Past Medical History: No date: DDD (degenerative disc disease), lumbar No date: Depression No date: Diabetes mellitus without complication (HCC) No date: Diverticulitis No date: Fatty liver No date: Fibroid tumor No date: GERD (gastroesophageal reflux disease) No date: Gout No date: H pylori ulcer No date: Hemorrhoids No date: Hypercholesteremia No date: Hypertension No date: IBS (irritable bowel syndrome) No date: IDA (iron deficiency anemia) No date: OA (osteoarthritis) No date: Obesity No date: RA (rheumatoid arthritis) (HCC) No date: Sleep apnea No date: Spinal stenosis No date: Vitamin D deficiency Past Surgical History: No date: ABDOMINAL HYSTERECTOMY     Comment:  partial No date: BACK SURGERY No date: CHOLECYSTECTOMY 08/18/2015: COLONOSCOPY WITH PROPOFOL; N/A     Comment:  Procedure: COLONOSCOPY WITH PROPOFOL;  Surgeon: Manya Silvas,  MD;  Location: [facility] ENDOSCOPY;  Service:               Endoscopy;  Laterality: N/A; 07/16/2020: COLONOSCOPY WITH PROPOFOL; N/A     Comment:  Procedure: COLONOSCOPY WITH PROPOFOL;  Surgeon:               Lesly Rubenstein, MD;  Location: [facility] ENDOSCOPY;                Service: Endoscopy;  Laterality: N/A; No date: ECTOPIC PREGNANCY SURGERY 08/18/2015: ESOPHAGOGASTRODUODENOSCOPY (EGD) WITH PROPOFOL; N/A     Comment:  Procedure: ESOPHAGOGASTRODUODENOSCOPY (EGD) WITH               PROPOFOL;  Surgeon: Manya Silvas, MD;  Location: [facility]              ENDOSCOPY;  Service: Endoscopy;  Laterality: N/A; No date: FOOT SURGERY No date: incision tendon sheath for trigger finger No date: NECK SURGERY No date: ROTATOR CUFF REPAIR; Right No date: SHOULDER SURGERY; Right No date: SPINE SURGERY     Comment:  neck x2 and lumbar spine x1 No date: uterine fibroid removed   Reproductive/Obstetrics negative OB ROS                             Anesthesia Physical Anesthesia Plan  ASA: 3  Anesthesia Plan: General   Post-op Pain Management:    Induction:   PONV Risk Score and Plan:   Airway Management Planned:   Additional Equipment:   Intra-op Plan:   Post-operative Plan:   Informed Consent: I have reviewed the patients History and  Physical, chart, labs and discussed the procedure including the risks, benefits and alternatives for the proposed anesthesia with the patient or authorized representative who has indicated his/her understanding and acceptance.     Dental Advisory Given  Plan Discussed with: CRNA  Anesthesia Plan Comments:         Anesthesia Quick Evaluation

## 2022-01-17 NOTE — Interval H&P Note (Signed)
History and Physical Interval Note:  01/17/2022 9:10 AM  Charlotte [name]  has presented today for surgery, with the diagnosis of GERD.  The various methods of treatment have been discussed with the patient and family. After consideration of risks, benefits and other options for treatment, the patient has consented to  Procedure(s) with comments: ESOPHAGOGASTRODUODENOSCOPY (EGD) WITH PROPOFOL (N/A) - DM as a surgical intervention.  The patient's history has been reviewed, patient examined, no change in status, stable for surgery.  I have reviewed the patient's chart and labs.  Questions were answered to the patient's satisfaction.     Lesly Rubenstein  Ok to proceed with EGD

## 2022-01-18 ENCOUNTER — Encounter: Payer: Self-pay | Admitting: Gastroenterology

## 2022-01-18 LAB — SURGICAL PATHOLOGY

## 2022-01-18 NOTE — Anesthesia Postprocedure Evaluation (Signed)
Anesthesia Post Note  Patient: Charlotte [name]  Procedure(s) Performed: ESOPHAGOGASTRODUODENOSCOPY (EGD) WITH PROPOFOL  Patient location during evaluation: PACU Anesthesia Type: General Level of consciousness: awake and alert Pain management: pain level controlled Vital Signs Assessment: post-procedure vital signs reviewed and stable Respiratory status: spontaneous breathing, nonlabored ventilation, respiratory function stable and patient connected to nasal cannula oxygen Cardiovascular status: blood pressure returned to baseline and stable Postop Assessment: no apparent nausea or vomiting Anesthetic complications: no   No notable events documented.   Last Vitals:  Vitals:   01/17/22 0943 01/17/22 0953  BP: 120/80 126/84  Pulse: 68 63  Resp: 18 20  Temp:    SpO2: 100% 100%    Last Pain:  Vitals:   01/17/22 0953  TempSrc:   PainSc: 0-No pain                 Molli Barrows

## 2022-01-23 DIAGNOSIS — Z0000 Encounter for general adult medical examination without abnormal findings: Secondary | ICD-10-CM | POA: Diagnosis not present

## 2022-01-23 DIAGNOSIS — Z780 Asymptomatic menopausal state: Secondary | ICD-10-CM | POA: Diagnosis not present

## 2022-01-23 DIAGNOSIS — Z1331 Encounter for screening for depression: Secondary | ICD-10-CM | POA: Diagnosis not present

## 2022-01-23 DIAGNOSIS — Z1330 Encounter for screening examination for mental health and behavioral disorders, unspecified: Secondary | ICD-10-CM | POA: Diagnosis not present

## 2022-01-24 ENCOUNTER — Other Ambulatory Visit: Payer: Self-pay | Admitting: Family Medicine

## 2022-01-24 DIAGNOSIS — Z780 Asymptomatic menopausal state: Secondary | ICD-10-CM

## 2022-01-25 DIAGNOSIS — Z23 Encounter for immunization: Secondary | ICD-10-CM | POA: Diagnosis not present

## 2022-01-25 DIAGNOSIS — Z7984 Long term (current) use of oral hypoglycemic drugs: Secondary | ICD-10-CM | POA: Diagnosis not present

## 2022-01-25 DIAGNOSIS — R7989 Other specified abnormal findings of blood chemistry: Secondary | ICD-10-CM | POA: Diagnosis not present

## 2022-01-25 DIAGNOSIS — Z8639 Personal history of other endocrine, nutritional and metabolic disease: Secondary | ICD-10-CM | POA: Diagnosis not present

## 2022-01-25 DIAGNOSIS — E1165 Type 2 diabetes mellitus with hyperglycemia: Secondary | ICD-10-CM | POA: Diagnosis not present

## 2022-01-25 DIAGNOSIS — Z794 Long term (current) use of insulin: Secondary | ICD-10-CM | POA: Diagnosis not present

## 2022-01-30 DIAGNOSIS — G4733 Obstructive sleep apnea (adult) (pediatric): Secondary | ICD-10-CM | POA: Diagnosis not present

## 2022-02-06 DIAGNOSIS — G894 Chronic pain syndrome: Secondary | ICD-10-CM | POA: Diagnosis not present

## 2022-02-06 DIAGNOSIS — M5136 Other intervertebral disc degeneration, lumbar region: Secondary | ICD-10-CM | POA: Diagnosis not present

## 2022-02-06 DIAGNOSIS — M5481 Occipital neuralgia: Secondary | ICD-10-CM | POA: Diagnosis not present

## 2022-02-06 DIAGNOSIS — M5030 Other cervical disc degeneration, unspecified cervical region: Secondary | ICD-10-CM | POA: Diagnosis not present

## 2022-03-01 ENCOUNTER — Other Ambulatory Visit
Admission: RE | Admit: 2022-03-01 | Discharge: 2022-03-01 | Disposition: A | Discharge status: Home / Self Care | Payer: Medicare Other | Source: Ambulatory Visit | Attending: Student | Admitting: Student

## 2022-03-01 ENCOUNTER — Emergency Department
Admission: EM | Admit: 2022-03-01 | Discharge: 2022-03-01 | Disposition: A | Discharge status: Home / Self Care | Payer: Medicare Other | Attending: Emergency Medicine | Admitting: Emergency Medicine

## 2022-03-01 ENCOUNTER — Other Ambulatory Visit: Payer: Self-pay

## 2022-03-01 DIAGNOSIS — R112 Nausea with vomiting, unspecified: Secondary | ICD-10-CM | POA: Insufficient documentation

## 2022-03-01 DIAGNOSIS — G4733 Obstructive sleep apnea (adult) (pediatric): Secondary | ICD-10-CM | POA: Diagnosis not present

## 2022-03-01 DIAGNOSIS — R799 Abnormal finding of blood chemistry, unspecified: Secondary | ICD-10-CM | POA: Insufficient documentation

## 2022-03-01 DIAGNOSIS — R001 Bradycardia, unspecified: Secondary | ICD-10-CM | POA: Diagnosis not present

## 2022-03-01 DIAGNOSIS — I499 Cardiac arrhythmia, unspecified: Secondary | ICD-10-CM | POA: Diagnosis not present

## 2022-03-01 DIAGNOSIS — R899 Unspecified abnormal finding in specimens from other organs, systems and tissues: Secondary | ICD-10-CM

## 2022-03-01 DIAGNOSIS — R1013 Epigastric pain: Secondary | ICD-10-CM | POA: Diagnosis not present

## 2022-03-01 DIAGNOSIS — R7989 Other specified abnormal findings of blood chemistry: Secondary | ICD-10-CM | POA: Diagnosis not present

## 2022-03-01 LAB — COMPREHENSIVE METABOLIC PANEL
ALT: 11 U/L (ref 0–44)
AST: 14 U/L — ABNORMAL LOW (ref 15–41)
Albumin: 4.2 g/dL (ref 3.5–5.0)
Alkaline Phosphatase: 64 U/L (ref 38–126)
Anion gap: 6 (ref 5–15)
BUN: 15 mg/dL (ref 8–23)
CO2: 30 mmol/L (ref 22–32)
Calcium: 10 mg/dL (ref 8.9–10.3)
Chloride: 103 mmol/L (ref 98–111)
Creatinine, Ser: 1.11 mg/dL — ABNORMAL HIGH (ref 0.44–1.00)
GFR, Estimated: 55 mL/min — ABNORMAL LOW (ref >60)
Glucose, Bld: 163 mg/dL — ABNORMAL HIGH (ref 70–99)
Potassium: 3.6 mmol/L (ref 3.5–5.1)
Sodium: 139 mmol/L (ref 135–145)
Total Bilirubin: 0.8 mg/dL (ref 0.3–1.2)
Total Protein: 8.2 g/dL — ABNORMAL HIGH (ref 6.5–8.1)

## 2022-03-01 LAB — CBC
HCT: 40.8 % (ref 36.0–46.0)
Hemoglobin: 13 g/dL (ref 12.0–15.0)
MCH: 26.5 pg (ref 26.0–34.0)
MCHC: 31.9 g/dL (ref 30.0–36.0)
MCV: 83.3 fL (ref 80.0–100.0)
Platelets: 443 10*3/uL — ABNORMAL HIGH (ref 150–400)
RBC: 4.9 MIL/uL (ref 3.87–5.11)
RDW: 12.9 % (ref 11.5–15.5)
WBC: 9.1 10*3/uL (ref 4.0–10.5)
nRBC: 0 % (ref 0.0–0.2)

## 2022-03-01 LAB — TROPONIN I (HIGH SENSITIVITY)
Troponin I (High Sensitivity): 20 ng/L — ABNORMAL HIGH (ref <18)
Troponin I (High Sensitivity): 21 ng/L — ABNORMAL HIGH (ref <18)

## 2022-03-01 LAB — LIPASE, BLOOD: Lipase: 49 U/L (ref 11–51)

## 2022-03-01 NOTE — ED Triage Notes (Addendum)
Pt comes with c/o abnormal labs. Pt states he r doc sent her over. Pt unsure of what labs. Pt states recent belly pain but not currently having any.   Troponin was abnormal per pt later in discussion.

## 2022-03-01 NOTE — Discharge Instructions (Signed)
Please follow-up with Dr. Clayborn Bigness like we discussed

## 2022-03-01 NOTE — ED Provider Notes (Signed)
[facility] Provider Note    Event Date/Time   First MD Initiated Contact with Patient 03/01/22 1339     (approximate)   History   Abnormal labs   HPI  Charlotte [name] is a 66 y.o. female who was sent from urgent care for evaluation because of elevated troponin.  Reportedly the patient was feeling nauseated and had a several episodes of vomiting this morning.  She does have burning in her chest at that time, she is feeling much better now has no chest pain at all, no shortness of breath.  Was sent over for troponin of 20.  On review of records the patient does have a baseline troponin of 20 in the past.  She is asymptomatic here     Physical Exam   Triage Vital Signs: ED Triage Vitals [03/01/22 1235]  Enc Vitals Group     BP 134/69     Pulse Rate 79     Resp 16     Temp 98.4 F (36.9 C)     Temp Source Oral     SpO2 100 %     Weight      Height      Head Circumference      Peak Flow      Pain Score 1     Pain Loc      Pain Edu?      Excl. in [place]?     Most recent vital signs: Vitals:   03/01/22 1235  BP: 134/69  Pulse: 79  Resp: 16  Temp: 98.4 F (36.9 C)  SpO2: 100%     General: Awake, no distress.  Pleasant and interactive CV:  Good peripheral perfusion.  Resp:  Normal effort.  Abd:  No distention.  Other:     ED Results / Procedures / Treatments   Labs (all labs ordered are listed, but only abnormal results are displayed) Labs Reviewed  COMPREHENSIVE METABOLIC PANEL - Abnormal; Notable for the following components:      Result Value   Glucose, Bld 163 (*)    Creatinine, Ser 1.11 (*)    Total Protein 8.2 (*)    AST 14 (*)    GFR, Estimated 55 (*)    All other components within normal limits  CBC - Abnormal; Notable for the following components:   Platelets 443 (*)    All other components within normal limits  TROPONIN I (HIGH SENSITIVITY) - Abnormal; Notable for the following components:   Troponin I (High  Sensitivity) 21 (*)    All other components within normal limits  LIPASE, BLOOD  URINALYSIS, ROUTINE W REFLEX MICROSCOPIC  TROPONIN I (HIGH SENSITIVITY)     EKG  ED ECG REPORT I, Lavonia Drafts, the attending physician, personally viewed and interpreted this ECG.  Date: 03/01/2022  Rhythm: normal sinus rhythm QRS Axis: normal Intervals:abnormal ST/T Wave abnormalities: non specific Narrative Interpretation: no evidence of acute ischemia    RADIOLOGY     PROCEDURES:  Critical Care performed:   Procedures   MEDICATIONS ORDERED IN ED: Medications - No data to display   IMPRESSION / MDM / [place] / ED COURSE  I reviewed the triage vital signs and the nursing notes. Patient's presentation is most consistent with acute presentation with potential threat to life or bodily function.   Patient sent to the emergency department given elevated troponin noted on outside labs.  Patient is feeling well at this time, asymptomatic, no further nausea vomiting.  No chest pain.  No shortness of breath.  EKG does not demonstrate any acute ischemia.  She notes that she has had issues with skipped beats in the past and has seen Dr. Clayborn Bigness of cardiology.  Lab work reviewed, troponin here is 21, this appears to be her baseline troponin, presentation is not consistent with ACS.  Appropriate for discharge at this time with close follow-up with Dr. Clayborn Bigness of cardiology, return precautions discussed.       FINAL CLINICAL IMPRESSION(S) / ED DIAGNOSES   Final diagnoses:  Abnormal laboratory test     Rx / DC Orders   ED Discharge Orders     None        Note:  This document was prepared using Dragon voice recognition software and may include unintentional dictation errors.   Lavonia Drafts, MD 03/01/22 [phone]

## 2022-03-02 DIAGNOSIS — R911 Solitary pulmonary nodule: Secondary | ICD-10-CM | POA: Diagnosis not present

## 2022-03-02 DIAGNOSIS — G4733 Obstructive sleep apnea (adult) (pediatric): Secondary | ICD-10-CM | POA: Diagnosis not present

## 2022-03-03 ENCOUNTER — Other Ambulatory Visit: Payer: Self-pay | Admitting: Student

## 2022-03-03 DIAGNOSIS — E669 Obesity, unspecified: Secondary | ICD-10-CM | POA: Diagnosis not present

## 2022-03-03 DIAGNOSIS — R009 Unspecified abnormalities of heart beat: Secondary | ICD-10-CM | POA: Diagnosis not present

## 2022-03-03 DIAGNOSIS — I10 Essential (primary) hypertension: Secondary | ICD-10-CM | POA: Diagnosis not present

## 2022-03-03 DIAGNOSIS — E782 Mixed hyperlipidemia: Secondary | ICD-10-CM | POA: Diagnosis not present

## 2022-03-03 DIAGNOSIS — R7989 Other specified abnormal findings of blood chemistry: Secondary | ICD-10-CM | POA: Diagnosis not present

## 2022-03-03 DIAGNOSIS — R002 Palpitations: Secondary | ICD-10-CM | POA: Diagnosis not present

## 2022-03-06 DIAGNOSIS — G894 Chronic pain syndrome: Secondary | ICD-10-CM | POA: Diagnosis not present

## 2022-03-06 DIAGNOSIS — M47897 Other spondylosis, lumbosacral region: Secondary | ICD-10-CM | POA: Diagnosis not present

## 2022-03-06 DIAGNOSIS — M5136 Other intervertebral disc degeneration, lumbar region: Secondary | ICD-10-CM | POA: Diagnosis not present

## 2022-03-06 DIAGNOSIS — M5030 Other cervical disc degeneration, unspecified cervical region: Secondary | ICD-10-CM | POA: Diagnosis not present

## 2022-03-06 DIAGNOSIS — E0840 Diabetes mellitus due to underlying condition with diabetic neuropathy, unspecified: Secondary | ICD-10-CM | POA: Diagnosis not present

## 2022-03-06 DIAGNOSIS — I10 Essential (primary) hypertension: Secondary | ICD-10-CM | POA: Diagnosis not present

## 2022-03-06 DIAGNOSIS — M47816 Spondylosis without myelopathy or radiculopathy, lumbar region: Secondary | ICD-10-CM | POA: Diagnosis not present

## 2022-03-06 DIAGNOSIS — M5481 Occipital neuralgia: Secondary | ICD-10-CM | POA: Diagnosis not present

## 2022-03-07 ENCOUNTER — Telehealth (HOSPITAL_COMMUNITY): Payer: Self-pay | Admitting: *Deleted

## 2022-03-07 MED ORDER — METOPROLOL TARTRATE 100 MG PO TABS: ORAL_TABLET | ORAL | 0 refills | Status: DC

## 2022-03-07 NOTE — Telephone Encounter (Signed)
Reaching out to patient to offer assistance regarding upcoming cardiac imaging study; pt verbalizes understanding of appt date/time, parking situation and where to check in, medications ordered, and verified current allergies; name and call back number provided for further questions should they arise  Gordy Clement RN [facility] [phone] office [phone] cell  Patient couldn't afford ivabradine. She is to take '100mg'$  metoprolol tartrate TWO hours prior to her cardiac CT scan.

## 2022-03-07 NOTE — Telephone Encounter (Signed)
Attempted to call patient regarding upcoming cardiac CT appointment. °Left message on voicemail with name and callback number ° °Merlon Alcorta RN Navigator Cardiac Imaging °[facility] °[phone] Office °[phone] Cell ° °

## 2022-03-09 ENCOUNTER — Ambulatory Visit
Admission: RE | Admit: 2022-03-09 | Discharge: 2022-03-09 | Disposition: A | Discharge status: Home / Self Care | Payer: Medicare Other | Source: Ambulatory Visit | Attending: Student | Admitting: Student

## 2022-03-09 DIAGNOSIS — I209 Angina pectoris, unspecified: Secondary | ICD-10-CM | POA: Insufficient documentation

## 2022-03-09 DIAGNOSIS — R7989 Other specified abnormal findings of blood chemistry: Secondary | ICD-10-CM | POA: Diagnosis not present

## 2022-03-09 MED ORDER — NITROGLYCERIN 0.4 MG SL SUBL
0.8000 mg | SUBLINGUAL_TABLET | Freq: Once | SUBLINGUAL | Status: AC
  Administered 2022-03-09: 0.8 mg via SUBLINGUAL

## 2022-03-09 MED ORDER — IOHEXOL 350 MG/ML SOLN
100.0000 mL | Freq: Once | INTRAVENOUS | Status: AC | PRN
  Administered 2022-03-09: 100 mL via INTRAVENOUS

## 2022-03-09 NOTE — Progress Notes (Signed)
Patient tolerated procedure well. Ambulate w/o difficulty. Denies light headedness or being dizzy. Sitting in chair drinking water provided. Encouraged to drink extra water today and reasoning explained. Verbalized understanding. All questions answered. ABC intact. No further needs. Discharge from procedure area w/o issues.   °

## 2022-03-22 DIAGNOSIS — Z5181 Encounter for therapeutic drug level monitoring: Secondary | ICD-10-CM | POA: Diagnosis not present

## 2022-04-01 DIAGNOSIS — G4733 Obstructive sleep apnea (adult) (pediatric): Secondary | ICD-10-CM | POA: Diagnosis not present

## 2022-04-03 DIAGNOSIS — M5481 Occipital neuralgia: Secondary | ICD-10-CM | POA: Diagnosis not present

## 2022-04-03 DIAGNOSIS — I10 Essential (primary) hypertension: Secondary | ICD-10-CM | POA: Diagnosis not present

## 2022-04-03 DIAGNOSIS — M7060 Trochanteric bursitis, unspecified hip: Secondary | ICD-10-CM | POA: Diagnosis not present

## 2022-04-03 DIAGNOSIS — M6740 Ganglion, unspecified site: Secondary | ICD-10-CM | POA: Diagnosis not present

## 2022-05-03 DIAGNOSIS — M4726 Other spondylosis with radiculopathy, lumbar region: Secondary | ICD-10-CM | POA: Diagnosis not present

## 2022-05-03 DIAGNOSIS — M47816 Spondylosis without myelopathy or radiculopathy, lumbar region: Secondary | ICD-10-CM | POA: Diagnosis not present

## 2022-05-03 DIAGNOSIS — M47896 Other spondylosis, lumbar region: Secondary | ICD-10-CM | POA: Diagnosis not present

## 2022-05-03 DIAGNOSIS — M47897 Other spondylosis, lumbosacral region: Secondary | ICD-10-CM | POA: Diagnosis not present

## 2022-05-04 DIAGNOSIS — E782 Mixed hyperlipidemia: Secondary | ICD-10-CM | POA: Diagnosis not present

## 2022-05-04 DIAGNOSIS — I10 Essential (primary) hypertension: Secondary | ICD-10-CM | POA: Diagnosis not present

## 2022-05-04 DIAGNOSIS — Z87891 Personal history of nicotine dependence: Secondary | ICD-10-CM | POA: Diagnosis not present

## 2022-05-04 DIAGNOSIS — R002 Palpitations: Secondary | ICD-10-CM | POA: Diagnosis not present

## 2022-05-04 DIAGNOSIS — E669 Obesity, unspecified: Secondary | ICD-10-CM | POA: Diagnosis not present

## 2022-05-10 DIAGNOSIS — Z8639 Personal history of other endocrine, nutritional and metabolic disease: Secondary | ICD-10-CM | POA: Diagnosis not present

## 2022-05-10 DIAGNOSIS — Z7984 Long term (current) use of oral hypoglycemic drugs: Secondary | ICD-10-CM | POA: Diagnosis not present

## 2022-05-10 DIAGNOSIS — Z794 Long term (current) use of insulin: Secondary | ICD-10-CM | POA: Diagnosis not present

## 2022-05-10 DIAGNOSIS — E1165 Type 2 diabetes mellitus with hyperglycemia: Secondary | ICD-10-CM | POA: Diagnosis not present

## 2022-05-10 DIAGNOSIS — E7800 Pure hypercholesterolemia, unspecified: Secondary | ICD-10-CM | POA: Diagnosis not present

## 2022-05-10 DIAGNOSIS — Z79899 Other long term (current) drug therapy: Secondary | ICD-10-CM | POA: Diagnosis not present

## 2022-05-15 DIAGNOSIS — M7712 Lateral epicondylitis, left elbow: Secondary | ICD-10-CM

## 2022-05-15 DIAGNOSIS — D352 Benign neoplasm of pituitary gland: Secondary | ICD-10-CM

## 2022-05-15 HISTORY — DX: Benign neoplasm of pituitary gland: D35.2

## 2022-05-15 HISTORY — DX: Lateral epicondylitis, left elbow: M77.12

## 2022-06-07 DIAGNOSIS — M47897 Other spondylosis, lumbosacral region: Secondary | ICD-10-CM | POA: Diagnosis not present

## 2022-06-30 DIAGNOSIS — G8929 Other chronic pain: Secondary | ICD-10-CM | POA: Diagnosis not present

## 2022-06-30 DIAGNOSIS — Z1231 Encounter for screening mammogram for malignant neoplasm of breast: Secondary | ICD-10-CM | POA: Diagnosis not present

## 2022-06-30 DIAGNOSIS — Z13820 Encounter for screening for osteoporosis: Secondary | ICD-10-CM | POA: Diagnosis not present

## 2022-06-30 DIAGNOSIS — M25511 Pain in right shoulder: Secondary | ICD-10-CM | POA: Diagnosis not present

## 2022-07-04 DIAGNOSIS — G5600 Carpal tunnel syndrome, unspecified upper limb: Secondary | ICD-10-CM | POA: Diagnosis not present

## 2022-07-10 DIAGNOSIS — Z79899 Other long term (current) drug therapy: Secondary | ICD-10-CM | POA: Diagnosis not present

## 2022-07-10 DIAGNOSIS — E1165 Type 2 diabetes mellitus with hyperglycemia: Secondary | ICD-10-CM | POA: Insufficient documentation

## 2022-07-10 DIAGNOSIS — Z7982 Long term (current) use of aspirin: Secondary | ICD-10-CM | POA: Diagnosis not present

## 2022-07-10 DIAGNOSIS — M7541 Impingement syndrome of right shoulder: Secondary | ICD-10-CM | POA: Diagnosis not present

## 2022-07-10 DIAGNOSIS — Z794 Long term (current) use of insulin: Secondary | ICD-10-CM | POA: Insufficient documentation

## 2022-07-10 DIAGNOSIS — R739 Hyperglycemia, unspecified: Secondary | ICD-10-CM | POA: Diagnosis not present

## 2022-07-10 DIAGNOSIS — Z7984 Long term (current) use of oral hypoglycemic drugs: Secondary | ICD-10-CM | POA: Diagnosis not present

## 2022-07-10 DIAGNOSIS — I10 Essential (primary) hypertension: Secondary | ICD-10-CM | POA: Diagnosis not present

## 2022-07-10 LAB — CBC WITH DIFFERENTIAL/PLATELET
Abs Immature Granulocytes: 0.05 10*3/uL (ref 0.00–0.07)
Basophils Absolute: 0 10*3/uL (ref 0.0–0.1)
Basophils Relative: 0 %
Eosinophils Absolute: 0 10*3/uL (ref 0.0–0.5)
Eosinophils Relative: 0 %
HCT: 38.4 % (ref 36.0–46.0)
Hemoglobin: 12.6 g/dL (ref 12.0–15.0)
Immature Granulocytes: 1 %
Lymphocytes Relative: 19 %
Lymphs Abs: 1.3 10*3/uL (ref 0.7–4.0)
MCH: 27 pg (ref 26.0–34.0)
MCHC: 32.8 g/dL (ref 30.0–36.0)
MCV: 82.4 fL (ref 80.0–100.0)
Monocytes Absolute: 0.1 10*3/uL (ref 0.1–1.0)
Monocytes Relative: 1 %
Neutro Abs: 5.6 10*3/uL (ref 1.7–7.7)
Neutrophils Relative %: 79 %
Platelets: 378 10*3/uL (ref 150–400)
RBC: 4.66 MIL/uL (ref 3.87–5.11)
RDW: 13.4 % (ref 11.5–15.5)
WBC: 7.1 10*3/uL (ref 4.0–10.5)
nRBC: 0 % (ref 0.0–0.2)

## 2022-07-10 LAB — CBG MONITORING, ED: Glucose-Capillary: 300 mg/dL — ABNORMAL HIGH (ref 70–99)

## 2022-07-10 NOTE — ED Triage Notes (Signed)
Pt states having shoulder pain x 2 weeks and had cortisone injections recently. Had been following sliding scale given by PCP to adjust insulin at home, but blood sugar has not been responding like it should.  CBG getting as high as 350 at home. Pt denies have any associated hyperglycemia sx.

## 2022-07-11 ENCOUNTER — Emergency Department
Admission: EM | Admit: 2022-07-11 | Discharge: 2022-07-11 | Disposition: A | Discharge status: Home / Self Care | Payer: Medicare Other | Attending: Emergency Medicine | Admitting: Emergency Medicine

## 2022-07-11 DIAGNOSIS — R739 Hyperglycemia, unspecified: Secondary | ICD-10-CM

## 2022-07-11 LAB — COMPREHENSIVE METABOLIC PANEL
ALT: 17 U/L (ref 0–44)
AST: 26 U/L (ref 15–41)
Albumin: 4.1 g/dL (ref 3.5–5.0)
Alkaline Phosphatase: 88 U/L (ref 38–126)
Anion gap: 14 (ref 5–15)
BUN: 13 mg/dL (ref 8–23)
CO2: 24 mmol/L (ref 22–32)
Calcium: 10 mg/dL (ref 8.9–10.3)
Chloride: 100 mmol/L (ref 98–111)
Creatinine, Ser: 0.94 mg/dL (ref 0.44–1.00)
GFR, Estimated: >60 mL/min (ref >60)
Glucose, Bld: 304 mg/dL — ABNORMAL HIGH (ref 70–99)
Potassium: 3.4 mmol/L — ABNORMAL LOW (ref 3.5–5.1)
Sodium: 138 mmol/L (ref 135–145)
Total Bilirubin: 0.5 mg/dL (ref 0.3–1.2)
Total Protein: 8.1 g/dL (ref 6.5–8.1)

## 2022-07-11 LAB — URINALYSIS, ROUTINE W REFLEX MICROSCOPIC
Bacteria, UA: NONE SEEN
Bilirubin Urine: NEGATIVE
Glucose, UA: >=500 mg/dL — AB
Hgb urine dipstick: NEGATIVE
Ketones, ur: NEGATIVE mg/dL
Leukocytes,Ua: NEGATIVE
Nitrite: NEGATIVE
Protein, ur: NEGATIVE mg/dL
Specific Gravity, Urine: 1 — ABNORMAL LOW (ref 1.005–1.030)
Squamous Epithelial / HPF: NONE SEEN /HPF (ref 0–5)
pH: 6 (ref 5.0–8.0)

## 2022-07-11 LAB — CBG MONITORING, ED: Glucose-Capillary: 232 mg/dL — ABNORMAL HIGH (ref 70–99)

## 2022-07-11 MED ORDER — SODIUM CHLORIDE 0.9 % IV BOLUS (SEPSIS)
1000.0000 mL | Freq: Once | INTRAVENOUS | Status: AC
  Administered 2022-07-11: 1000 mL via INTRAVENOUS

## 2022-07-11 NOTE — ED Provider Notes (Signed)
[facility] Provider Note    Event Date/Time   First MD Initiated Contact with Patient 07/11/22 0222     (approximate)   History   Hyperglycemia   HPI  Charlotte [name] is a 67 y.o. female with history of insulin-dependent diabetes who presents to the emergency department for concerns for blood sugars being in the 350s at home after receiving a cortisone injection.  She states that she has been using her sliding scale insulin but continues to have sugars that are intermittently rising.  She did feel nauseated earlier but this has resolved.  No fevers, cough, chest pain, shortness of breath, vomiting, diarrhea.   History provided by patient, family.    Past Medical History:  Diagnosis Date   DDD (degenerative disc disease), lumbar    Depression    Diabetes mellitus without complication (HCC)    Diverticulitis    Fatty liver    Fibroid tumor    GERD (gastroesophageal reflux disease)    Gout    H pylori ulcer    Hemorrhoids    Hypercholesteremia    Hypertension    IBS (irritable bowel syndrome)    IDA (iron deficiency anemia)    OA (osteoarthritis)    Obesity    RA (rheumatoid arthritis) ([place])    Sleep apnea    Spinal stenosis    Vitamin D deficiency     Past Surgical History:  Procedure Laterality Date   ABDOMINAL HYSTERECTOMY     partial   BACK SURGERY     CHOLECYSTECTOMY     COLONOSCOPY WITH PROPOFOL N/A 08/18/2015   Procedure: COLONOSCOPY WITH PROPOFOL;  Surgeon: Manya Silvas, MD;  Location: [facility];  Service: Endoscopy;  Laterality: N/A;   COLONOSCOPY WITH PROPOFOL N/A 07/16/2020   Procedure: COLONOSCOPY WITH PROPOFOL;  Surgeon: Lesly Rubenstein, MD;  Location: [facility] ENDOSCOPY;  Service: Endoscopy;  Laterality: N/A;   ECTOPIC PREGNANCY SURGERY     ESOPHAGOGASTRODUODENOSCOPY (EGD) WITH PROPOFOL N/A 08/18/2015   Procedure: ESOPHAGOGASTRODUODENOSCOPY (EGD) WITH PROPOFOL;  Surgeon: Manya Silvas, MD;  Location: [facility] ENDOSCOPY;   Service: Endoscopy;  Laterality: N/A;   ESOPHAGOGASTRODUODENOSCOPY (EGD) WITH PROPOFOL N/A 01/17/2022   Procedure: ESOPHAGOGASTRODUODENOSCOPY (EGD) WITH PROPOFOL;  Surgeon: Lesly Rubenstein, MD;  Location: [facility] ENDOSCOPY;  Service: Endoscopy;  Laterality: N/A;  DM   FOOT SURGERY     incision tendon sheath for trigger finger     NECK SURGERY     ROTATOR CUFF REPAIR Right    SHOULDER SURGERY Right    SPINE SURGERY     neck x2 and lumbar spine x1   uterine fibroid removed      MEDICATIONS:  Prior to Admission medications   Medication Sig Start Date End Date Taking? Authorizing Provider  allopurinol (ZYLOPRIM) 100 MG tablet Take 200 mg by mouth daily.    [provider]  amLODipine (NORVASC) 10 MG tablet TAKE 1 TABLET(10 MG TOTAL) BY MOUTH DAILY. 11/08/15   Steele Sizer, MD  aspirin 81 MG chewable tablet Chew by mouth daily.    [provider]  atorvastatin (LIPITOR) 40 MG tablet Take 40 mg by mouth daily. Patient not taking: Reported on 07/16/2020    [provider]  carvedilol (COREG) 6.25 MG tablet Take 6.25 mg by mouth 2 (two) times daily with a meal.    [provider]  cetirizine (ZYRTEC) 10 MG tablet Take 10 mg by mouth daily.    [provider]  diclofenac Sodium (VOLTAREN) 1 %  GEL Apply topically 4 (four) times daily.    [provider]  dicyclomine (BENTYL) 10 MG capsule Take 1 capsule (10 mg total) by mouth 4 (four) times daily for 14 days. 01/13/20 01/27/20  Triplett, Johnette Abraham B, FNP  empagliflozin (JARDIANCE) 25 MG TABS tablet Take 25 mg by mouth daily.    [provider]  fluticasone (FLONASE) 50 MCG/ACT nasal spray Place 2 sprays into both nostrils as needed for allergies. 11/06/14   Steele Sizer, MD  gabapentin (NEURONTIN) 300 MG capsule Limit 1-2 tabs by mouth 2-3 times per day if tolerated 01/03/16   Mohammed Kindle, MD  glipiZIDE (GLUCOTROL) 10 MG tablet Take 10 mg by mouth daily before breakfast.     [provider]  HYDROcodone-acetaminophen (NORCO/VICODIN) 5-325 MG tablet Limit 1 tab by mouth per day or twice a day if tolerated 10/30/16   Dustin Flock, MD  hydroxychloroquine (PLAQUENIL) 200 MG tablet Take 200 mg by mouth 2 (two) times daily.     [provider]  hydroxypropyl methylcellulose / hypromellose (ISOPTO TEARS / GONIOVISC) 2.5 % ophthalmic solution Place 1 drop into the right eye every hour as needed for dry eyes. 01/13/19   Carrie Mew, MD  Insulin Glargine [facility]) 100 UNIT/ML Inject 68 Units into the skin daily. 68 units AM and 75 units PM    [provider]  insulin lispro (HUMALOG) 100 UNIT/ML injection Inject 65 Units into the skin daily. In middle of day    [provider]  insulin NPH Human (HUMULIN N,NOVOLIN N) 100 UNIT/ML injection Inject 85 Units into the skin 2 (two) times daily before a meal. 9 am and 9 pm Patient not taking: Reported on 01/17/2022    [provider]  leflunomide (ARAVA) 10 MG tablet Take 10 mg by mouth daily.    [provider]  losartan-hydrochlorothiazide (HYZAAR) 100-25 MG tablet Take 1 tablet by mouth daily. 10/14/16   [provider]  metFORMIN (GLUCOPHAGE-XR) 500 MG 24 hr tablet Take 500 mg by mouth daily. 09/05/16   [provider]  metoprolol succinate (TOPROL-XL) 50 MG 24 hr tablet Take 1 tablet (50 mg total) by mouth daily. Take with or immediately following a meal. 11/06/14   Sowles, Drue Stager, MD  metoprolol tartrate (LOPRESSOR) 100 MG tablet Take tablet ('100mg'$ ) TWO hours prior to your cardiac CT scan. 03/07/22   Kate Sable, MD  omeprazole (PRILOSEC) 20 MG capsule Take 20 mg by mouth daily.    [provider]  pantoprazole (PROTONIX) 40 MG tablet Take 40 mg by mouth 2 (two) times daily. Patient not taking: No sig reported 10/17/16   [provider]  polyethylene glycol (MIRALAX) packet Take 17 g by mouth daily. 10/30/16   Dustin Flock, MD   pravastatin (PRAVACHOL) 40 MG tablet TAKE 1 TABLET BY MOUTH EVERY EVENING FOR CHOLESTEROL IN PLACE OF LIPITOR 02/05/15   Sowles, Drue Stager, MD  senna-docusate (SENOKOT-S) 8.6-50 MG tablet Take 1 tablet by mouth 2 (two) times daily. 10/30/16   Dustin Flock, MD  sertraline (ZOLOFT) 50 MG tablet Take 50 mg by mouth daily.    [provider]  spironolactone (ALDACTONE) 25 MG tablet Take 25 mg by mouth daily.    [provider]  sucralfate (CARAFATE) 1 G tablet Take 1 tablet by mouth 4 (four) times daily. Reported on 11/11/2015 11/26/14   [provider]  topiramate (TOPAMAX) 25 MG capsule Take 25 mg by mouth 2 (two) times daily.    [provider]  Physical Exam   Triage Vital Signs: ED Triage Vitals  Enc Vitals Group     BP 07/10/22 2322 (!) 183/149     Pulse Rate 07/10/22 2322 92     Resp 07/10/22 2322 18     Temp 07/10/22 2322 98.4 F (36.9 C)     Temp Source 07/10/22 2322 Oral     SpO2 07/10/22 2322 98 %     Weight 07/10/22 2322 210 lb (95.3 kg)     Height 07/10/22 2322 '5\' 4"'$  (1.626 m)     Head Circumference --      Peak Flow --      Pain Score 07/10/22 2331 0     Pain Loc --      Pain Edu? --      Excl. in [place]? --     Most recent vital signs: Vitals:   07/11/22 0224 07/11/22 0330  BP: (!) 172/96 (!) 141/89  Pulse: 83 73  Resp: 18 20  Temp:    SpO2: 100% 100%    CONSTITUTIONAL: Alert, responds appropriately to questions. Well-appearing; well-nourished HEAD: Normocephalic, atraumatic EYES: Conjunctivae clear, pupils appear equal, sclera nonicteric ENT: normal nose; moist mucous membranes NECK: Supple, normal ROM CARD: RRR; S1 and S2 appreciated RESP: Normal chest excursion without splinting or tachypnea; breath sounds clear and equal bilaterally; no wheezes, no rhonchi, no rales, no hypoxia or respiratory distress, speaking full sentences ABD/GI: Non-distended; soft, non-tender, no rebound, no guarding, no peritoneal signs BACK: The  back appears normal EXT: Normal ROM in all joints; no deformity noted, no edema SKIN: Normal color for age and race; warm; no rash on exposed skin NEURO: Moves all extremities equally, normal speech PSYCH: The patient's mood and manner are appropriate.   ED Results / Procedures / Treatments   LABS: (all labs ordered are listed, but only abnormal results are displayed) Labs Reviewed  COMPREHENSIVE METABOLIC PANEL - Abnormal; Notable for the following components:      Result Value   Potassium 3.4 (*)    Glucose, Bld 304 (*)    All other components within normal limits  URINALYSIS, ROUTINE W REFLEX MICROSCOPIC - Abnormal; Notable for the following components:   Color, Urine COLORLESS (*)    APPearance CLEAR (*)    Specific Gravity, Urine 1.000 (*)    Glucose, UA >=500 (*)    All other components within normal limits  CBG MONITORING, ED - Abnormal; Notable for the following components:   Glucose-Capillary 300 (*)    All other components within normal limits  CBG MONITORING, ED - Abnormal; Notable for the following components:   Glucose-Capillary 232 (*)    All other components within normal limits  CBC WITH DIFFERENTIAL/PLATELET  CBG MONITORING, ED     EKG:  RADIOLOGY: My personal review and interpretation of imaging:    I have personally reviewed all radiology reports.   No results found.   PROCEDURES:  Critical Care performed: No    Procedures    IMPRESSION / MDM / ASSESSMENT AND PLAN / ED COURSE  I reviewed the triage vital signs and the nursing notes.    Patient here with complaints of hyperglycemia.     DIFFERENTIAL DIAGNOSIS (includes but not limited to):   Hyperglycemia, DKA, HHS, dehydration   Patient's presentation is most consistent with acute complicated illness / injury requiring diagnostic workup.   PLAN: Labs obtained from triage show normal anion gap, bicarb.  No ketones in her urine.  Normal hemoglobin.  Blood sugar  now down to 232.   Will give a liter of IV fluids to help bring this down further but have encouraged her to talk to her primary care doctor about potential adjustment of her sliding scale insulin.  Recommended diet changes.  Patient verbalized understanding but then asked secretary for graham crackers.  Discussion had with patient that this would not be the best choice for keeping her blood sugar down.   MEDICATIONS GIVEN IN ED: Medications  sodium chloride 0.9 % bolus 1,000 mL (0 mLs Intravenous Stopped 07/11/22 0333)     ED COURSE:  At this time, I do not feel there is any life-threatening condition present. I reviewed all nursing notes, vitals, pertinent previous records.  All lab and urine results, EKGs, imaging ordered have been independently reviewed and interpreted by myself.  I reviewed all available radiology reports from any imaging ordered this visit.  Based on my assessment, I feel the patient is safe to be discharged home without further emergent workup and can continue workup as an outpatient as needed. Discussed all findings, treatment plan as well as usual and customary return precautions.  They verbalize understanding and are comfortable with this plan.  Outpatient follow-up has been provided as needed.  All questions have been answered.    CONSULTS:  none   OUTSIDE RECORDS REVIEWED: Reviewed last endocrinology note on 05/10/2022.       FINAL CLINICAL IMPRESSION(S) / ED DIAGNOSES   Final diagnoses:  Hyperglycemia     Rx / DC Orders   ED Discharge Orders     None        Note:  This document was prepared using Dragon voice recognition software and may include unintentional dictation errors.   Minka Knight, Delice Bison, DO 07/11/22 [phone]

## 2022-07-11 NOTE — ED Notes (Signed)
Pt brought to ed rm 15 at this time, this RN now assuming care.

## 2022-07-11 NOTE — ED Notes (Signed)
ED Provider at bedside. 

## 2022-07-11 NOTE — ED Notes (Signed)
Pt's home monitor sts BG is 208

## 2022-07-11 NOTE — Discharge Instructions (Signed)
Please follow-up with your primary care doctor and/your endocrinologist for further management of your diabetes.

## 2022-07-13 DIAGNOSIS — Z1231 Encounter for screening mammogram for malignant neoplasm of breast: Secondary | ICD-10-CM | POA: Diagnosis not present

## 2022-07-13 DIAGNOSIS — Z13820 Encounter for screening for osteoporosis: Secondary | ICD-10-CM | POA: Diagnosis not present

## 2022-07-13 LAB — HM MAMMOGRAPHY

## 2022-07-13 LAB — HM DEXA SCAN

## 2022-07-27 DIAGNOSIS — E1165 Type 2 diabetes mellitus with hyperglycemia: Secondary | ICD-10-CM | POA: Diagnosis not present

## 2022-07-27 DIAGNOSIS — E7800 Pure hypercholesterolemia, unspecified: Secondary | ICD-10-CM | POA: Diagnosis not present

## 2022-07-27 DIAGNOSIS — Z794 Long term (current) use of insulin: Secondary | ICD-10-CM | POA: Diagnosis not present

## 2022-07-27 DIAGNOSIS — Z79899 Other long term (current) drug therapy: Secondary | ICD-10-CM | POA: Diagnosis not present

## 2022-07-27 DIAGNOSIS — Z7984 Long term (current) use of oral hypoglycemic drugs: Secondary | ICD-10-CM | POA: Diagnosis not present

## 2022-07-27 DIAGNOSIS — Z6837 Body mass index (BMI) 37.0-37.9, adult: Secondary | ICD-10-CM | POA: Diagnosis not present

## 2022-08-02 DIAGNOSIS — M7541 Impingement syndrome of right shoulder: Secondary | ICD-10-CM | POA: Diagnosis not present

## 2022-08-08 DIAGNOSIS — G5600 Carpal tunnel syndrome, unspecified upper limb: Secondary | ICD-10-CM | POA: Diagnosis not present

## 2022-08-09 ENCOUNTER — Other Ambulatory Visit: Payer: Self-pay | Admitting: Family Medicine

## 2022-08-09 DIAGNOSIS — Z1231 Encounter for screening mammogram for malignant neoplasm of breast: Secondary | ICD-10-CM

## 2022-08-25 DIAGNOSIS — S0501XA Injury of conjunctiva and corneal abrasion without foreign body, right eye, initial encounter: Secondary | ICD-10-CM | POA: Diagnosis not present

## 2022-08-25 DIAGNOSIS — I10 Essential (primary) hypertension: Secondary | ICD-10-CM | POA: Diagnosis not present

## 2022-08-29 DIAGNOSIS — R911 Solitary pulmonary nodule: Secondary | ICD-10-CM | POA: Diagnosis not present

## 2022-08-29 DIAGNOSIS — G4733 Obstructive sleep apnea (adult) (pediatric): Secondary | ICD-10-CM | POA: Diagnosis not present

## 2022-08-29 DIAGNOSIS — J439 Emphysema, unspecified: Secondary | ICD-10-CM | POA: Diagnosis not present

## 2022-09-06 DIAGNOSIS — G5600 Carpal tunnel syndrome, unspecified upper limb: Secondary | ICD-10-CM | POA: Diagnosis not present

## 2022-09-06 DIAGNOSIS — G894 Chronic pain syndrome: Secondary | ICD-10-CM | POA: Diagnosis not present

## 2022-09-06 DIAGNOSIS — Z79891 Long term (current) use of opiate analgesic: Secondary | ICD-10-CM | POA: Diagnosis not present

## 2022-09-06 DIAGNOSIS — F1910 Other psychoactive substance abuse, uncomplicated: Secondary | ICD-10-CM | POA: Diagnosis not present

## 2022-09-06 DIAGNOSIS — Z5181 Encounter for therapeutic drug level monitoring: Secondary | ICD-10-CM | POA: Diagnosis not present

## 2022-09-28 DIAGNOSIS — M069 Rheumatoid arthritis, unspecified: Secondary | ICD-10-CM | POA: Diagnosis not present

## 2022-09-28 DIAGNOSIS — I10 Essential (primary) hypertension: Secondary | ICD-10-CM | POA: Diagnosis not present

## 2022-09-28 DIAGNOSIS — Z1231 Encounter for screening mammogram for malignant neoplasm of breast: Secondary | ICD-10-CM | POA: Diagnosis not present

## 2022-09-28 DIAGNOSIS — Z13820 Encounter for screening for osteoporosis: Secondary | ICD-10-CM | POA: Diagnosis not present

## 2022-10-04 DIAGNOSIS — G5600 Carpal tunnel syndrome, unspecified upper limb: Secondary | ICD-10-CM | POA: Diagnosis not present

## 2022-10-10 DIAGNOSIS — H179 Unspecified corneal scar and opacity: Secondary | ICD-10-CM | POA: Diagnosis not present

## 2022-10-10 DIAGNOSIS — B0052 Herpesviral keratitis: Secondary | ICD-10-CM | POA: Diagnosis not present

## 2022-10-10 DIAGNOSIS — H2513 Age-related nuclear cataract, bilateral: Secondary | ICD-10-CM | POA: Diagnosis not present

## 2022-10-24 DIAGNOSIS — B0052 Herpesviral keratitis: Secondary | ICD-10-CM | POA: Diagnosis not present

## 2022-10-24 DIAGNOSIS — H2513 Age-related nuclear cataract, bilateral: Secondary | ICD-10-CM | POA: Diagnosis not present

## 2022-11-02 DIAGNOSIS — Z87891 Personal history of nicotine dependence: Secondary | ICD-10-CM | POA: Diagnosis not present

## 2022-11-02 DIAGNOSIS — E782 Mixed hyperlipidemia: Secondary | ICD-10-CM | POA: Diagnosis not present

## 2022-11-02 DIAGNOSIS — I10 Essential (primary) hypertension: Secondary | ICD-10-CM | POA: Diagnosis not present

## 2022-11-02 DIAGNOSIS — E669 Obesity, unspecified: Secondary | ICD-10-CM | POA: Diagnosis not present

## 2022-11-07 DIAGNOSIS — G5600 Carpal tunnel syndrome, unspecified upper limb: Secondary | ICD-10-CM | POA: Diagnosis not present

## 2022-11-20 ENCOUNTER — Emergency Department (HOSPITAL_COMMUNITY)
Admission: EM | Admit: 2022-11-20 | Discharge: 2022-11-20 | Disposition: A | Discharge status: Home / Self Care | Payer: Medicare Other | Attending: Emergency Medicine | Admitting: Emergency Medicine

## 2022-11-20 ENCOUNTER — Emergency Department (HOSPITAL_COMMUNITY): Payer: Medicare Other

## 2022-11-20 DIAGNOSIS — S3991XA Unspecified injury of abdomen, initial encounter: Secondary | ICD-10-CM | POA: Insufficient documentation

## 2022-11-20 DIAGNOSIS — M1712 Unilateral primary osteoarthritis, left knee: Secondary | ICD-10-CM | POA: Diagnosis not present

## 2022-11-20 DIAGNOSIS — M25512 Pain in left shoulder: Secondary | ICD-10-CM | POA: Insufficient documentation

## 2022-11-20 DIAGNOSIS — Z7982 Long term (current) use of aspirin: Secondary | ICD-10-CM | POA: Insufficient documentation

## 2022-11-20 DIAGNOSIS — Z794 Long term (current) use of insulin: Secondary | ICD-10-CM | POA: Insufficient documentation

## 2022-11-20 DIAGNOSIS — S79922A Unspecified injury of left thigh, initial encounter: Secondary | ICD-10-CM | POA: Diagnosis not present

## 2022-11-20 DIAGNOSIS — R0689 Other abnormalities of breathing: Secondary | ICD-10-CM | POA: Diagnosis not present

## 2022-11-20 DIAGNOSIS — M79652 Pain in left thigh: Secondary | ICD-10-CM | POA: Insufficient documentation

## 2022-11-20 DIAGNOSIS — S299XXA Unspecified injury of thorax, initial encounter: Secondary | ICD-10-CM | POA: Diagnosis not present

## 2022-11-20 DIAGNOSIS — M19012 Primary osteoarthritis, left shoulder: Secondary | ICD-10-CM | POA: Diagnosis not present

## 2022-11-20 DIAGNOSIS — S3993XA Unspecified injury of pelvis, initial encounter: Secondary | ICD-10-CM | POA: Diagnosis not present

## 2022-11-20 DIAGNOSIS — Y92410 Unspecified street and highway as the place of occurrence of the external cause: Secondary | ICD-10-CM | POA: Insufficient documentation

## 2022-11-20 DIAGNOSIS — S0990XA Unspecified injury of head, initial encounter: Secondary | ICD-10-CM | POA: Diagnosis not present

## 2022-11-20 DIAGNOSIS — M79605 Pain in left leg: Secondary | ICD-10-CM | POA: Diagnosis not present

## 2022-11-20 DIAGNOSIS — Z981 Arthrodesis status: Secondary | ICD-10-CM | POA: Diagnosis not present

## 2022-11-20 DIAGNOSIS — S4992XA Unspecified injury of left shoulder and upper arm, initial encounter: Secondary | ICD-10-CM | POA: Diagnosis not present

## 2022-11-20 DIAGNOSIS — M47812 Spondylosis without myelopathy or radiculopathy, cervical region: Secondary | ICD-10-CM | POA: Diagnosis not present

## 2022-11-20 DIAGNOSIS — I517 Cardiomegaly: Secondary | ICD-10-CM | POA: Diagnosis not present

## 2022-11-20 DIAGNOSIS — I10 Essential (primary) hypertension: Secondary | ICD-10-CM | POA: Diagnosis not present

## 2022-11-20 DIAGNOSIS — R519 Headache, unspecified: Secondary | ICD-10-CM | POA: Diagnosis not present

## 2022-11-20 DIAGNOSIS — S199XXA Unspecified injury of neck, initial encounter: Secondary | ICD-10-CM | POA: Diagnosis not present

## 2022-11-20 DIAGNOSIS — R220 Localized swelling, mass and lump, head: Secondary | ICD-10-CM | POA: Diagnosis not present

## 2022-11-20 DIAGNOSIS — M25562 Pain in left knee: Secondary | ICD-10-CM | POA: Diagnosis not present

## 2022-11-20 LAB — I-STAT CHEM 8, ED
BUN: 20 mg/dL (ref 8–23)
Calcium, Ion: 1.12 mmol/L — ABNORMAL LOW (ref 1.15–1.40)
Chloride: 101 mmol/L (ref 98–111)
Creatinine, Ser: 1.3 mg/dL — ABNORMAL HIGH (ref 0.44–1.00)
Glucose, Bld: 135 mg/dL — ABNORMAL HIGH (ref 70–99)
HCT: 36 % (ref 36.0–46.0)
Hemoglobin: 12.2 g/dL (ref 12.0–15.0)
Potassium: 3 mmol/L — ABNORMAL LOW (ref 3.5–5.1)
Sodium: 142 mmol/L (ref 135–145)
TCO2: 29 mmol/L (ref 22–32)

## 2022-11-20 LAB — COMPREHENSIVE METABOLIC PANEL
ALT: 13 U/L (ref 0–44)
AST: 17 U/L (ref 15–41)
Albumin: 3.7 g/dL (ref 3.5–5.0)
Alkaline Phosphatase: 58 U/L (ref 38–126)
Anion gap: 12 (ref 5–15)
BUN: 17 mg/dL (ref 8–23)
CO2: 26 mmol/L (ref 22–32)
Calcium: 9.3 mg/dL (ref 8.9–10.3)
Chloride: 101 mmol/L (ref 98–111)
Creatinine, Ser: 1.3 mg/dL — ABNORMAL HIGH (ref 0.44–1.00)
GFR, Estimated: 45 mL/min — ABNORMAL LOW (ref >60)
Glucose, Bld: 133 mg/dL — ABNORMAL HIGH (ref 70–99)
Potassium: 2.9 mmol/L — ABNORMAL LOW (ref 3.5–5.1)
Sodium: 139 mmol/L (ref 135–145)
Total Bilirubin: 0.6 mg/dL (ref 0.3–1.2)
Total Protein: 7.1 g/dL (ref 6.5–8.1)

## 2022-11-20 LAB — SAMPLE TO BLOOD BANK

## 2022-11-20 LAB — PROTIME-INR
INR: 1.1 (ref 0.8–1.2)
Prothrombin Time: 14 seconds (ref 11.4–15.2)

## 2022-11-20 LAB — CBC
HCT: 36.2 % (ref 36.0–46.0)
Hemoglobin: 11.9 g/dL — ABNORMAL LOW (ref 12.0–15.0)
MCH: 27.9 pg (ref 26.0–34.0)
MCHC: 32.9 g/dL (ref 30.0–36.0)
MCV: 84.8 fL (ref 80.0–100.0)
Platelets: 312 10*3/uL (ref 150–400)
RBC: 4.27 MIL/uL (ref 3.87–5.11)
RDW: 13.9 % (ref 11.5–15.5)
WBC: 7.6 10*3/uL (ref 4.0–10.5)
nRBC: 0 % (ref 0.0–0.2)

## 2022-11-20 LAB — LACTIC ACID, PLASMA: Lactic Acid, Venous: 1.9 mmol/L (ref 0.5–1.9)

## 2022-11-20 LAB — ETHANOL: Alcohol, Ethyl (B): <10 mg/dL (ref <10)

## 2022-11-20 MED ORDER — LORAZEPAM 2 MG/ML IJ SOLN
0.5000 mg | Freq: Once | INTRAMUSCULAR | Status: AC | PRN
  Administered 2022-11-20: 0.5 mg via INTRAVENOUS
  Filled 2022-11-20: qty 1

## 2022-11-20 MED ORDER — POTASSIUM CHLORIDE CRYS ER 20 MEQ PO TBCR
40.0000 meq | EXTENDED_RELEASE_TABLET | Freq: Once | ORAL | Status: AC
  Administered 2022-11-20: 40 meq via ORAL
  Filled 2022-11-20: qty 2

## 2022-11-20 MED ORDER — IOHEXOL 350 MG/ML SOLN
60.0000 mL | Freq: Once | INTRAVENOUS | Status: AC | PRN
  Administered 2022-11-20: 60 mL via INTRAVENOUS

## 2022-11-20 MED ORDER — MORPHINE SULFATE (PF) 4 MG/ML IV SOLN
4.0000 mg | Freq: Once | INTRAVENOUS | Status: AC
  Administered 2022-11-20: 4 mg via INTRAVENOUS
  Filled 2022-11-20: qty 1
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
## 2022-11-20 NOTE — Discharge Instructions (Addendum)
Return for any problem.  Use previously prescribed pain medication as needed for pain.  As discussed, you should follow-up closely with your regular doctor.  You will require a Pituitary-focused MRI with and without contrast to evaluate for possible enlarged pituitary gland.  This does not have to happen tonight.  This MRI should be arranged to occur over the next several weeks.  If you are found to have an enlarged pituitary this is something that should be identified early so that appropriate management can occur.
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
## 2022-11-22 DIAGNOSIS — M25512 Pain in left shoulder: Secondary | ICD-10-CM | POA: Diagnosis not present

## 2022-11-22 DIAGNOSIS — G8929 Other chronic pain: Secondary | ICD-10-CM | POA: Diagnosis not present

## 2022-11-22 DIAGNOSIS — E236 Other disorders of pituitary gland: Secondary | ICD-10-CM | POA: Diagnosis not present

## 2022-11-28 ENCOUNTER — Other Ambulatory Visit: Payer: Self-pay | Admitting: Family Medicine

## 2022-11-28 DIAGNOSIS — E236 Other disorders of pituitary gland: Secondary | ICD-10-CM

## 2022-12-06 ENCOUNTER — Ambulatory Visit
Admission: RE | Admit: 2022-12-06 | Discharge: 2022-12-06 | Disposition: A | Discharge status: Home / Self Care | Payer: Medicare Other | Source: Ambulatory Visit | Attending: Family Medicine | Admitting: Family Medicine

## 2022-12-06 DIAGNOSIS — I771 Stricture of artery: Secondary | ICD-10-CM | POA: Diagnosis not present

## 2022-12-06 DIAGNOSIS — E236 Other disorders of pituitary gland: Secondary | ICD-10-CM | POA: Diagnosis not present

## 2022-12-06 DIAGNOSIS — D352 Benign neoplasm of pituitary gland: Secondary | ICD-10-CM | POA: Diagnosis not present

## 2022-12-06 MED ORDER — GADOBUTROL 1 MMOL/ML IV SOLN
10.0000 mL | Freq: Once | INTRAVENOUS | Status: AC | PRN
  Administered 2022-12-06: 10 mL via INTRAVENOUS

## 2022-12-08 DIAGNOSIS — M7712 Lateral epicondylitis, left elbow: Secondary | ICD-10-CM | POA: Insufficient documentation

## 2022-12-08 DIAGNOSIS — M25552 Pain in left hip: Secondary | ICD-10-CM | POA: Insufficient documentation

## 2022-12-08 DIAGNOSIS — M7062 Trochanteric bursitis, left hip: Secondary | ICD-10-CM | POA: Diagnosis not present

## 2022-12-08 DIAGNOSIS — M7502 Adhesive capsulitis of left shoulder: Secondary | ICD-10-CM | POA: Diagnosis not present

## 2022-12-12 DIAGNOSIS — M25512 Pain in left shoulder: Secondary | ICD-10-CM | POA: Diagnosis not present

## 2022-12-12 DIAGNOSIS — M25552 Pain in left hip: Secondary | ICD-10-CM | POA: Diagnosis not present

## 2022-12-14 DIAGNOSIS — M5459 Other low back pain: Secondary | ICD-10-CM | POA: Diagnosis not present

## 2022-12-14 DIAGNOSIS — Z79891 Long term (current) use of opiate analgesic: Secondary | ICD-10-CM | POA: Diagnosis not present

## 2022-12-14 DIAGNOSIS — M5451 Vertebrogenic low back pain: Secondary | ICD-10-CM | POA: Diagnosis not present

## 2022-12-14 DIAGNOSIS — G894 Chronic pain syndrome: Secondary | ICD-10-CM | POA: Diagnosis not present

## 2022-12-14 DIAGNOSIS — M79602 Pain in left arm: Secondary | ICD-10-CM | POA: Diagnosis not present

## 2022-12-14 DIAGNOSIS — Z5181 Encounter for therapeutic drug level monitoring: Secondary | ICD-10-CM | POA: Diagnosis not present

## 2022-12-15 DIAGNOSIS — M25512 Pain in left shoulder: Secondary | ICD-10-CM | POA: Diagnosis not present

## 2022-12-15 DIAGNOSIS — M25552 Pain in left hip: Secondary | ICD-10-CM | POA: Diagnosis not present

## 2022-12-18 DIAGNOSIS — M25552 Pain in left hip: Secondary | ICD-10-CM | POA: Diagnosis not present

## 2022-12-18 DIAGNOSIS — M25512 Pain in left shoulder: Secondary | ICD-10-CM | POA: Diagnosis not present

## 2022-12-20 DIAGNOSIS — M0609 Rheumatoid arthritis without rheumatoid factor, multiple sites: Secondary | ICD-10-CM | POA: Diagnosis not present

## 2022-12-20 DIAGNOSIS — Z7960 Long term (current) use of unspecified immunomodulators and immunosuppressants: Secondary | ICD-10-CM | POA: Diagnosis not present

## 2022-12-20 DIAGNOSIS — M1A09X0 Idiopathic chronic gout, multiple sites, without tophus (tophi): Secondary | ICD-10-CM | POA: Diagnosis not present

## 2022-12-22 DIAGNOSIS — Z5181 Encounter for therapeutic drug level monitoring: Secondary | ICD-10-CM | POA: Diagnosis not present

## 2022-12-22 DIAGNOSIS — M25512 Pain in left shoulder: Secondary | ICD-10-CM | POA: Diagnosis not present

## 2022-12-22 DIAGNOSIS — M5459 Other low back pain: Secondary | ICD-10-CM | POA: Diagnosis not present

## 2022-12-22 DIAGNOSIS — Z79891 Long term (current) use of opiate analgesic: Secondary | ICD-10-CM | POA: Diagnosis not present

## 2022-12-22 DIAGNOSIS — M25552 Pain in left hip: Secondary | ICD-10-CM | POA: Diagnosis not present

## 2022-12-22 DIAGNOSIS — G894 Chronic pain syndrome: Secondary | ICD-10-CM | POA: Diagnosis not present

## 2022-12-26 DIAGNOSIS — M25552 Pain in left hip: Secondary | ICD-10-CM | POA: Diagnosis not present

## 2022-12-26 DIAGNOSIS — M25512 Pain in left shoulder: Secondary | ICD-10-CM | POA: Diagnosis not present

## 2022-12-29 DIAGNOSIS — M25552 Pain in left hip: Secondary | ICD-10-CM | POA: Diagnosis not present

## 2022-12-29 DIAGNOSIS — M25512 Pain in left shoulder: Secondary | ICD-10-CM | POA: Diagnosis not present

## 2023-01-01 DIAGNOSIS — M25552 Pain in left hip: Secondary | ICD-10-CM | POA: Diagnosis not present

## 2023-01-01 DIAGNOSIS — M25512 Pain in left shoulder: Secondary | ICD-10-CM | POA: Diagnosis not present

## 2023-01-03 DIAGNOSIS — K5900 Constipation, unspecified: Secondary | ICD-10-CM | POA: Diagnosis not present

## 2023-01-03 DIAGNOSIS — R1030 Lower abdominal pain, unspecified: Secondary | ICD-10-CM | POA: Diagnosis not present

## 2023-01-04 DIAGNOSIS — G894 Chronic pain syndrome: Secondary | ICD-10-CM | POA: Diagnosis not present

## 2023-01-04 DIAGNOSIS — M542 Cervicalgia: Secondary | ICD-10-CM | POA: Diagnosis not present

## 2023-01-04 DIAGNOSIS — Z5181 Encounter for therapeutic drug level monitoring: Secondary | ICD-10-CM | POA: Diagnosis not present

## 2023-01-04 DIAGNOSIS — Z79891 Long term (current) use of opiate analgesic: Secondary | ICD-10-CM | POA: Diagnosis not present

## 2023-01-04 DIAGNOSIS — M5459 Other low back pain: Secondary | ICD-10-CM | POA: Diagnosis not present

## 2023-01-05 DIAGNOSIS — M25512 Pain in left shoulder: Secondary | ICD-10-CM | POA: Diagnosis not present

## 2023-01-05 DIAGNOSIS — M25552 Pain in left hip: Secondary | ICD-10-CM | POA: Diagnosis not present

## 2023-01-08 DIAGNOSIS — M25512 Pain in left shoulder: Secondary | ICD-10-CM | POA: Diagnosis not present

## 2023-01-08 DIAGNOSIS — M25552 Pain in left hip: Secondary | ICD-10-CM | POA: Diagnosis not present

## 2023-01-11 DIAGNOSIS — E119 Type 2 diabetes mellitus without complications: Secondary | ICD-10-CM | POA: Diagnosis not present

## 2023-01-11 DIAGNOSIS — M7542 Impingement syndrome of left shoulder: Secondary | ICD-10-CM | POA: Diagnosis not present

## 2023-01-12 DIAGNOSIS — M25552 Pain in left hip: Secondary | ICD-10-CM | POA: Diagnosis not present

## 2023-01-12 DIAGNOSIS — M25512 Pain in left shoulder: Secondary | ICD-10-CM | POA: Diagnosis not present

## 2023-01-16 DIAGNOSIS — M25552 Pain in left hip: Secondary | ICD-10-CM | POA: Diagnosis not present

## 2023-01-16 DIAGNOSIS — M25512 Pain in left shoulder: Secondary | ICD-10-CM | POA: Diagnosis not present

## 2023-01-18 DIAGNOSIS — M25512 Pain in left shoulder: Secondary | ICD-10-CM | POA: Diagnosis not present

## 2023-01-18 DIAGNOSIS — M25552 Pain in left hip: Secondary | ICD-10-CM | POA: Diagnosis not present

## 2023-01-22 DIAGNOSIS — M7062 Trochanteric bursitis, left hip: Secondary | ICD-10-CM | POA: Diagnosis not present

## 2023-01-22 DIAGNOSIS — M7502 Adhesive capsulitis of left shoulder: Secondary | ICD-10-CM | POA: Diagnosis not present

## 2023-01-23 DIAGNOSIS — M25512 Pain in left shoulder: Secondary | ICD-10-CM | POA: Diagnosis not present

## 2023-01-23 DIAGNOSIS — M25552 Pain in left hip: Secondary | ICD-10-CM | POA: Diagnosis not present

## 2023-01-26 DIAGNOSIS — M25552 Pain in left hip: Secondary | ICD-10-CM | POA: Diagnosis not present

## 2023-01-26 DIAGNOSIS — M25512 Pain in left shoulder: Secondary | ICD-10-CM | POA: Diagnosis not present

## 2023-01-29 DIAGNOSIS — M25512 Pain in left shoulder: Secondary | ICD-10-CM | POA: Diagnosis not present

## 2023-01-29 DIAGNOSIS — M25552 Pain in left hip: Secondary | ICD-10-CM | POA: Diagnosis not present

## 2023-02-01 DIAGNOSIS — Z5181 Encounter for therapeutic drug level monitoring: Secondary | ICD-10-CM | POA: Diagnosis not present

## 2023-02-01 DIAGNOSIS — G894 Chronic pain syndrome: Secondary | ICD-10-CM | POA: Diagnosis not present

## 2023-02-01 DIAGNOSIS — M542 Cervicalgia: Secondary | ICD-10-CM | POA: Diagnosis not present

## 2023-02-01 DIAGNOSIS — Z79891 Long term (current) use of opiate analgesic: Secondary | ICD-10-CM | POA: Diagnosis not present

## 2023-02-01 DIAGNOSIS — M5459 Other low back pain: Secondary | ICD-10-CM | POA: Diagnosis not present

## 2023-02-02 DIAGNOSIS — M25552 Pain in left hip: Secondary | ICD-10-CM | POA: Diagnosis not present

## 2023-02-02 DIAGNOSIS — M25512 Pain in left shoulder: Secondary | ICD-10-CM | POA: Diagnosis not present

## 2023-02-06 DIAGNOSIS — M25512 Pain in left shoulder: Secondary | ICD-10-CM | POA: Diagnosis not present

## 2023-02-06 DIAGNOSIS — M25552 Pain in left hip: Secondary | ICD-10-CM | POA: Diagnosis not present

## 2023-02-08 DIAGNOSIS — Z794 Long term (current) use of insulin: Secondary | ICD-10-CM | POA: Diagnosis not present

## 2023-02-08 DIAGNOSIS — E1165 Type 2 diabetes mellitus with hyperglycemia: Secondary | ICD-10-CM | POA: Diagnosis not present

## 2023-02-08 DIAGNOSIS — E7800 Pure hypercholesterolemia, unspecified: Secondary | ICD-10-CM | POA: Diagnosis not present

## 2023-02-08 DIAGNOSIS — Z79899 Other long term (current) drug therapy: Secondary | ICD-10-CM | POA: Diagnosis not present

## 2023-02-08 DIAGNOSIS — E876 Hypokalemia: Secondary | ICD-10-CM | POA: Diagnosis not present

## 2023-02-08 DIAGNOSIS — Z9049 Acquired absence of other specified parts of digestive tract: Secondary | ICD-10-CM | POA: Diagnosis not present

## 2023-02-08 DIAGNOSIS — Z6835 Body mass index (BMI) 35.0-35.9, adult: Secondary | ICD-10-CM | POA: Diagnosis not present

## 2023-02-08 DIAGNOSIS — L680 Hirsutism: Secondary | ICD-10-CM | POA: Diagnosis not present

## 2023-02-08 DIAGNOSIS — D352 Benign neoplasm of pituitary gland: Secondary | ICD-10-CM | POA: Diagnosis not present

## 2023-02-08 DIAGNOSIS — Z6837 Body mass index (BMI) 37.0-37.9, adult: Secondary | ICD-10-CM | POA: Diagnosis not present

## 2023-02-12 ENCOUNTER — Other Ambulatory Visit: Payer: Self-pay

## 2023-02-12 ENCOUNTER — Emergency Department
Admission: EM | Admit: 2023-02-12 | Discharge: 2023-02-12 | Disposition: A | Discharge status: Home / Self Care | Payer: Medicare Other | Attending: Emergency Medicine | Admitting: Emergency Medicine

## 2023-02-12 DIAGNOSIS — E1065 Type 1 diabetes mellitus with hyperglycemia: Secondary | ICD-10-CM | POA: Diagnosis not present

## 2023-02-12 DIAGNOSIS — Z7982 Long term (current) use of aspirin: Secondary | ICD-10-CM | POA: Diagnosis not present

## 2023-02-12 DIAGNOSIS — M25552 Pain in left hip: Secondary | ICD-10-CM | POA: Diagnosis not present

## 2023-02-12 DIAGNOSIS — M25512 Pain in left shoulder: Secondary | ICD-10-CM | POA: Diagnosis not present

## 2023-02-12 DIAGNOSIS — I10 Essential (primary) hypertension: Secondary | ICD-10-CM | POA: Diagnosis not present

## 2023-02-12 DIAGNOSIS — R739 Hyperglycemia, unspecified: Secondary | ICD-10-CM | POA: Diagnosis not present

## 2023-02-12 DIAGNOSIS — Z79899 Other long term (current) drug therapy: Secondary | ICD-10-CM | POA: Insufficient documentation

## 2023-02-12 DIAGNOSIS — R110 Nausea: Secondary | ICD-10-CM | POA: Insufficient documentation

## 2023-02-12 DIAGNOSIS — Z7984 Long term (current) use of oral hypoglycemic drugs: Secondary | ICD-10-CM | POA: Diagnosis not present

## 2023-02-12 DIAGNOSIS — R112 Nausea with vomiting, unspecified: Secondary | ICD-10-CM | POA: Diagnosis not present

## 2023-02-12 DIAGNOSIS — E1165 Type 2 diabetes mellitus with hyperglycemia: Secondary | ICD-10-CM | POA: Diagnosis not present

## 2023-02-12 LAB — COMPREHENSIVE METABOLIC PANEL
ALT: 16 U/L (ref 0–44)
AST: 16 U/L (ref 15–41)
Albumin: 3.8 g/dL (ref 3.5–5.0)
Alkaline Phosphatase: 76 U/L (ref 38–126)
Anion gap: 10 (ref 5–15)
BUN: 20 mg/dL (ref 8–23)
CO2: 28 mmol/L (ref 22–32)
Calcium: 9.4 mg/dL (ref 8.9–10.3)
Chloride: 100 mmol/L (ref 98–111)
Creatinine, Ser: 1.01 mg/dL — ABNORMAL HIGH (ref 0.44–1.00)
GFR, Estimated: >60 mL/min (ref >60)
Glucose, Bld: 137 mg/dL — ABNORMAL HIGH (ref 70–99)
Potassium: 3.3 mmol/L — ABNORMAL LOW (ref 3.5–5.1)
Sodium: 138 mmol/L (ref 135–145)
Total Bilirubin: 0.5 mg/dL (ref 0.3–1.2)
Total Protein: 7.6 g/dL (ref 6.5–8.1)

## 2023-02-12 LAB — URINALYSIS, ROUTINE W REFLEX MICROSCOPIC
Bilirubin Urine: NEGATIVE
Glucose, UA: NEGATIVE mg/dL
Hgb urine dipstick: NEGATIVE
Ketones, ur: NEGATIVE mg/dL
Leukocytes,Ua: NEGATIVE
Nitrite: NEGATIVE
Protein, ur: NEGATIVE mg/dL
Specific Gravity, Urine: 1.008 (ref 1.005–1.030)
pH: 5 (ref 5.0–8.0)

## 2023-02-12 LAB — CBG MONITORING, ED
Glucose-Capillary: 141 mg/dL — ABNORMAL HIGH (ref 70–99)
Glucose-Capillary: 171 mg/dL — ABNORMAL HIGH (ref 70–99)

## 2023-02-12 LAB — CBC
HCT: 37.4 % (ref 36.0–46.0)
Hemoglobin: 12.3 g/dL (ref 12.0–15.0)
MCH: 28 pg (ref 26.0–34.0)
MCHC: 32.9 g/dL (ref 30.0–36.0)
MCV: 85.2 fL (ref 80.0–100.0)
Platelets: 336 10*3/uL (ref 150–400)
RBC: 4.39 MIL/uL (ref 3.87–5.11)
RDW: 13 % (ref 11.5–15.5)
WBC: 8.3 10*3/uL (ref 4.0–10.5)
nRBC: 0 % (ref 0.0–0.2)

## 2023-02-12 LAB — LIPASE, BLOOD: Lipase: 23 U/L (ref 11–51)

## 2023-02-12 MED ORDER — ONDANSETRON 4 MG PO TBDP: 4.0000 mg | ORAL_TABLET | Freq: Four times a day (QID) | ORAL | 0 refills | Status: DC | PRN

## 2023-02-12 MED ORDER — ONDANSETRON 4 MG PO TBDP: 4.0000 mg | ORAL_TABLET | Freq: Four times a day (QID) | ORAL | 0 refills | Status: AC | PRN

## 2023-02-12 MED ORDER — ONDANSETRON 4 MG PO TBDP
4.0000 mg | ORAL_TABLET | Freq: Once | ORAL | Status: AC
  Administered 2023-02-12: 4 mg via ORAL
  Filled 2023-02-12: qty 1

## 2023-02-12 NOTE — ED Triage Notes (Signed)
Pt to ed from home via POV for "sugar issues". Pt advised her sugar was higher today around 245 and then it dropped to 104 and she has been feeling nauseated today as well. This has been going on all day. Pt is caox4, in no acute distress and ambulatory in triage. Pt denies vomiting. Pt was seen at PCP earlier this week and had a fever but otherwise was fine.

## 2023-02-12 NOTE — ED Provider Notes (Signed)
[facility] Provider Note    Event Date/Time   First MD Initiated Contact with Patient 02/12/23 [phone]     (approximate)   History   Nausea   HPI  Charlotte [name] is a 67 y.o. female with history insulin-dependent diabetes, hypertension, rheumatoid arthritis, pituitary macroadenoma who presents to the emergency department with concerns for hyperglycemia today.  States her blood sugar was in the 200s which concerned her.  She took her fast acting insulin prior to arrival in the ED.  She does report getting her flu and shingles vaccinations on 9/25.  She has had some nausea but no vomiting or diarrhea.  No chest pain or shortness of breath.  She states she saw her endocrinologist on Thursday, September 26.  Patient is on Basaglar, Humalog, metformin and [place].  Reports compliance with her medications.   History provided by patient, significant other.    Past Medical History:  Diagnosis Date   DDD (degenerative disc disease), lumbar    Depression    Diabetes mellitus without complication (HCC)    Diverticulitis    Fatty liver    Fibroid tumor    GERD (gastroesophageal reflux disease)    Gout    H pylori ulcer    Hemorrhoids    Hypercholesteremia    Hypertension    IBS (irritable bowel syndrome)    IDA (iron deficiency anemia)    OA (osteoarthritis)    Obesity    RA (rheumatoid arthritis) (HCC)    Sleep apnea    Spinal stenosis    Vitamin D deficiency     Past Surgical History:  Procedure Laterality Date   ABDOMINAL HYSTERECTOMY     partial   BACK SURGERY     CHOLECYSTECTOMY     COLONOSCOPY WITH PROPOFOL N/A 08/18/2015   Procedure: COLONOSCOPY WITH PROPOFOL;  Surgeon: Scot Jun, MD;  Location: [facility] ENDOSCOPY;  Service: Endoscopy;  Laterality: N/A;   COLONOSCOPY WITH PROPOFOL N/A 07/16/2020   Procedure: COLONOSCOPY WITH PROPOFOL;  Surgeon: Regis Bill, MD;  Location: [facility] ENDOSCOPY;  Service: Endoscopy;  Laterality: N/A;   ECTOPIC  PREGNANCY SURGERY     ESOPHAGOGASTRODUODENOSCOPY (EGD) WITH PROPOFOL N/A 08/18/2015   Procedure: ESOPHAGOGASTRODUODENOSCOPY (EGD) WITH PROPOFOL;  Surgeon: Scot Jun, MD;  Location: [facility] ENDOSCOPY;  Service: Endoscopy;  Laterality: N/A;   ESOPHAGOGASTRODUODENOSCOPY (EGD) WITH PROPOFOL N/A 01/17/2022   Procedure: ESOPHAGOGASTRODUODENOSCOPY (EGD) WITH PROPOFOL;  Surgeon: Regis Bill, MD;  Location: [facility] ENDOSCOPY;  Service: Endoscopy;  Laterality: N/A;  DM   FOOT SURGERY     incision tendon sheath for trigger finger     NECK SURGERY     ROTATOR CUFF REPAIR Right    SHOULDER SURGERY Right    SPINE SURGERY     neck x2 and lumbar spine x1   uterine fibroid removed      MEDICATIONS:  Prior to Admission medications   Medication Sig Start Date End Date Taking? Authorizing Provider  allopurinol (ZYLOPRIM) 100 MG tablet Take 200 mg by mouth daily.    [provider]  amLODipine (NORVASC) 10 MG tablet TAKE 1 TABLET(10 MG TOTAL) BY MOUTH DAILY. 11/08/15   Alba Cory, MD  aspirin 81 MG chewable tablet Chew by mouth daily.    [provider]  atorvastatin (LIPITOR) 40 MG tablet Take 40 mg by mouth daily. Patient not taking: Reported on 07/16/2020    [provider]  carvedilol (COREG) 6.25 MG tablet Take 6.25 mg by mouth 2 (two)  times daily with a meal.    [provider]  cetirizine (ZYRTEC) 10 MG tablet Take 10 mg by mouth daily.    [provider]  diclofenac Sodium (VOLTAREN) 1 % GEL Apply topically 4 (four) times daily.    [provider]  dicyclomine (BENTYL) 10 MG capsule Take 1 capsule (10 mg total) by mouth 4 (four) times daily for 14 days. 01/13/20 01/27/20  Triplett, Rulon Eisenmenger B, FNP  empagliflozin (JARDIANCE) 25 MG TABS tablet Take 25 mg by mouth daily.    [provider]  fluticasone (FLONASE) 50 MCG/ACT nasal spray Place 2 sprays into both nostrils as needed for allergies. 11/06/14   Alba Cory, MD  gabapentin  (NEURONTIN) 300 MG capsule Limit 1-2 tabs by mouth 2-3 times per day if tolerated 01/03/16   Ewing Schlein, MD  glipiZIDE (GLUCOTROL) 10 MG tablet Take 10 mg by mouth daily before breakfast.     [provider]  HYDROcodone-acetaminophen (NORCO/VICODIN) 5-325 MG tablet Limit 1 tab by mouth per day or twice a day if tolerated 10/30/16   Auburn Bilberry, MD  hydroxychloroquine (PLAQUENIL) 200 MG tablet Take 200 mg by mouth 2 (two) times daily.     [provider]  hydroxypropyl methylcellulose / hypromellose (ISOPTO TEARS / GONIOVISC) 2.5 % ophthalmic solution Place 1 drop into the right eye every hour as needed for dry eyes. 01/13/19   Sharman Cheek, MD  Insulin Glargine [facility]) 100 UNIT/ML Inject 68 Units into the skin daily. 68 units AM and 75 units PM    [provider]  insulin lispro (HUMALOG) 100 UNIT/ML injection Inject 65 Units into the skin daily. In middle of day    [provider]  insulin NPH Human (HUMULIN N,NOVOLIN N) 100 UNIT/ML injection Inject 85 Units into the skin 2 (two) times daily before a meal. 9 am and 9 pm Patient not taking: Reported on 01/17/2022    [provider]  leflunomide (ARAVA) 10 MG tablet Take 10 mg by mouth daily.    [provider]  losartan-hydrochlorothiazide (HYZAAR) 100-25 MG tablet Take 1 tablet by mouth daily. 10/14/16   [provider]  metFORMIN (GLUCOPHAGE-XR) 500 MG 24 hr tablet Take 500 mg by mouth daily. 09/05/16   [provider]  metoprolol succinate (TOPROL-XL) 50 MG 24 hr tablet Take 1 tablet (50 mg total) by mouth daily. Take with or immediately following a meal. 11/06/14   Carlynn Purl, Danna Hefty, MD  metoprolol tartrate (LOPRESSOR) 100 MG tablet Take tablet (100mg ) TWO hours prior to your cardiac CT scan. 03/07/22   Debbe Odea, MD  omeprazole (PRILOSEC) 20 MG capsule Take 20 mg by mouth daily.    [provider]  pantoprazole (PROTONIX) 40 MG tablet Take 40  mg by mouth 2 (two) times daily. Patient not taking: No sig reported 10/17/16   [provider]  polyethylene glycol (MIRALAX) packet Take 17 g by mouth daily. 10/30/16   Auburn Bilberry, MD  pravastatin (PRAVACHOL) 40 MG tablet TAKE 1 TABLET BY MOUTH EVERY EVENING FOR CHOLESTEROL IN PLACE OF LIPITOR 02/05/15   Sowles, Danna Hefty, MD  senna-docusate (SENOKOT-S) 8.6-50 MG tablet Take 1 tablet by mouth 2 (two) times daily. 10/30/16   Auburn Bilberry, MD  sertraline (ZOLOFT) 50 MG tablet Take 50 mg by mouth daily.    [provider]  spironolactone (ALDACTONE) 25 MG tablet Take 25 mg by mouth daily.    [provider]  sucralfate (CARAFATE) 1 G tablet Take 1 tablet by  mouth 4 (four) times daily. Reported on 11/11/2015 11/26/14   [provider]  topiramate (TOPAMAX) 25 MG capsule Take 25 mg by mouth 2 (two) times daily.    [provider]    Physical Exam   Triage Vital Signs: ED Triage Vitals [02/12/23 0158]  Encounter Vitals Group     BP (!) 158/80     Systolic BP Percentile      Diastolic BP Percentile      Pulse Rate 79     Resp 14     Temp 98.1 F (36.7 C)     Temp Source Oral     SpO2 100 %     Weight      Height 5\' 6"  (1.676 m)     Head Circumference      Peak Flow      Pain Score 0     Pain Loc      Pain Education      Exclude from Growth Chart     Most recent vital signs: Vitals:   02/12/23 0158 02/12/23 0330  BP: (!) 158/80 131/73  Pulse: 79 72  Resp: 14   Temp: 98.1 F (36.7 C)   SpO2: 100% 99%    CONSTITUTIONAL: Alert, responds appropriately to questions. Well-appearing; well-nourished HEAD: Normocephalic, atraumatic EYES: Conjunctivae clear, pupils appear equal, sclera nonicteric ENT: normal nose; moist mucous membranes NECK: Supple, normal ROM CARD: RRR; S1 and S2 appreciated RESP: Normal chest excursion without splinting or tachypnea; breath sounds clear and equal bilaterally; no wheezes, no rhonchi, no rales, no  hypoxia or respiratory distress, speaking full sentences ABD/GI: Non-distended; soft, non-tender, no rebound, no guarding, no peritoneal signs BACK: The back appears normal EXT: Normal ROM in all joints; no deformity noted, no edema SKIN: Normal color for age and race; warm; no rash on exposed skin NEURO: Moves all extremities equally, normal speech PSYCH: The patient's mood and manner are appropriate.   ED Results / Procedures / Treatments   LABS: (all labs ordered are listed, but only abnormal results are displayed) Labs Reviewed  COMPREHENSIVE METABOLIC PANEL - Abnormal; Notable for the following components:      Result Value   Potassium 3.3 (*)    Glucose, Bld 137 (*)    Creatinine, Ser 1.01 (*)    All other components within normal limits  URINALYSIS, ROUTINE W REFLEX MICROSCOPIC - Abnormal; Notable for the following components:   Color, Urine STRAW (*)    APPearance CLEAR (*)    All other components within normal limits  CBG MONITORING, ED - Abnormal; Notable for the following components:   Glucose-Capillary 141 (*)    All other components within normal limits  CBG MONITORING, ED - Abnormal; Notable for the following components:   Glucose-Capillary 171 (*)    All other components within normal limits  LIPASE, BLOOD  CBC     EKG:   RADIOLOGY: My personal review and interpretation of imaging:    I have personally reviewed all radiology reports.   No results found.   PROCEDURES:  Critical Care performed: No   CRITICAL CARE Performed by: Rochele Raring   Total critical care time: 0 minutes  Critical care time was exclusive of separately billable procedures and treating other patients.  Critical care was necessary to treat or prevent imminent or life-threatening deterioration.  Critical care was time spent personally by me on the following activities: development of treatment plan with patient and/or surrogate as well as nursing, discussions with  consultants, evaluation of patient's response to treatment, examination of patient, obtaining history from patient or surrogate, ordering and performing treatments and interventions, ordering and review of laboratory studies, ordering and review of radiographic studies, pulse oximetry and re-evaluation of patient's condition.   Procedures    IMPRESSION / MDM / ASSESSMENT AND PLAN / ED COURSE  I reviewed the triage vital signs and the nursing notes.    Patient here for concerns for hyperglycemia with blood sugar of 245 at home.  Also having some nausea but no vomiting.  The patient is on the cardiac monitor to evaluate for evidence of arrhythmia and/or significant heart rate changes.   DIFFERENTIAL DIAGNOSIS (includes but not limited to):   Low suspicion for DKA, HHS, symptoms could be secondary to dietary changes, UTI, kidney dysfunction  She has not yet taken the Decadron that was prescribed by her endocrinologist.   Patient's presentation is most consistent with acute complicated illness / injury requiring diagnostic workup.   PLAN: Patient's labs, urine are unremarkable today.  Normal hemoglobin, creatinine,.  No DKA.  Urine shows no ketones or signs of infection.  Blood sugar in the 100s here.  I feel she is safe for discharge.  She is feeling better after oral Zofran.  Tolerating p.o.  Patient is comfortable with this plan.   MEDICATIONS GIVEN IN ED: Medications  ondansetron (ZOFRAN-ODT) disintegrating tablet 4 mg (4 mg Oral Given 02/12/23 0327)     ED COURSE:  At this time, I do not feel there is any life-threatening condition present. I reviewed all nursing notes, vitals, pertinent previous records.  All lab and urine results, EKGs, imaging ordered have been independently reviewed and interpreted by myself.  I reviewed all available radiology reports from any imaging ordered this visit.  Based on my assessment, I feel the patient is safe to be discharged home without further  emergent workup and can continue workup as an outpatient as needed. Discussed all findings, treatment plan as well as usual and customary return precautions.  They verbalize understanding and are comfortable with this plan.  Outpatient follow-up has been provided as needed.  All questions have been answered.    CONSULTS:  none   OUTSIDE RECORDS REVIEWED: Reviewed recent endocrinology note.       FINAL CLINICAL IMPRESSION(S) / ED DIAGNOSES   Final diagnoses:  Hyperglycemia  Nausea     Rx / DC Orders   ED Discharge Orders          Ordered    ondansetron (ZOFRAN-ODT) 4 MG disintegrating tablet  Every 6 hours PRN        02/12/23 0350             Note:  This document was prepared using Dragon voice recognition software and may include unintentional dictation errors.   Inanna Telford, Layla Maw, DO 02/12/23 [phone]

## 2023-02-13 DIAGNOSIS — M25552 Pain in left hip: Secondary | ICD-10-CM | POA: Diagnosis not present

## 2023-02-13 DIAGNOSIS — Z7982 Long term (current) use of aspirin: Secondary | ICD-10-CM | POA: Diagnosis not present

## 2023-02-13 DIAGNOSIS — E1165 Type 2 diabetes mellitus with hyperglycemia: Secondary | ICD-10-CM | POA: Diagnosis not present

## 2023-02-13 DIAGNOSIS — D352 Benign neoplasm of pituitary gland: Secondary | ICD-10-CM | POA: Diagnosis not present

## 2023-02-13 DIAGNOSIS — Z794 Long term (current) use of insulin: Secondary | ICD-10-CM | POA: Diagnosis not present

## 2023-02-13 DIAGNOSIS — E119 Type 2 diabetes mellitus without complications: Secondary | ICD-10-CM | POA: Diagnosis not present

## 2023-02-13 DIAGNOSIS — Z79899 Other long term (current) drug therapy: Secondary | ICD-10-CM | POA: Diagnosis not present

## 2023-02-13 DIAGNOSIS — Z7984 Long term (current) use of oral hypoglycemic drugs: Secondary | ICD-10-CM | POA: Diagnosis not present

## 2023-02-13 DIAGNOSIS — M25512 Pain in left shoulder: Secondary | ICD-10-CM | POA: Diagnosis not present

## 2023-02-14 DIAGNOSIS — E1165 Type 2 diabetes mellitus with hyperglycemia: Secondary | ICD-10-CM | POA: Diagnosis not present

## 2023-02-14 DIAGNOSIS — D352 Benign neoplasm of pituitary gland: Secondary | ICD-10-CM | POA: Diagnosis not present

## 2023-02-14 DIAGNOSIS — Z794 Long term (current) use of insulin: Secondary | ICD-10-CM | POA: Diagnosis not present

## 2023-02-19 DIAGNOSIS — M25552 Pain in left hip: Secondary | ICD-10-CM | POA: Diagnosis not present

## 2023-02-19 DIAGNOSIS — M25512 Pain in left shoulder: Secondary | ICD-10-CM | POA: Diagnosis not present

## 2023-02-20 DIAGNOSIS — M25512 Pain in left shoulder: Secondary | ICD-10-CM | POA: Diagnosis not present

## 2023-02-20 DIAGNOSIS — M25552 Pain in left hip: Secondary | ICD-10-CM | POA: Diagnosis not present

## 2023-02-22 DIAGNOSIS — D352 Benign neoplasm of pituitary gland: Secondary | ICD-10-CM | POA: Diagnosis not present

## 2023-02-27 DIAGNOSIS — M25512 Pain in left shoulder: Secondary | ICD-10-CM | POA: Diagnosis not present

## 2023-02-27 DIAGNOSIS — M25552 Pain in left hip: Secondary | ICD-10-CM | POA: Diagnosis not present

## 2023-02-28 DIAGNOSIS — B9689 Other specified bacterial agents as the cause of diseases classified elsewhere: Secondary | ICD-10-CM | POA: Diagnosis not present

## 2023-02-28 DIAGNOSIS — Z03818 Encounter for observation for suspected exposure to other biological agents ruled out: Secondary | ICD-10-CM | POA: Diagnosis not present

## 2023-02-28 DIAGNOSIS — R42 Dizziness and giddiness: Secondary | ICD-10-CM | POA: Diagnosis not present

## 2023-02-28 DIAGNOSIS — J019 Acute sinusitis, unspecified: Secondary | ICD-10-CM | POA: Diagnosis not present

## 2023-03-01 DIAGNOSIS — M5459 Other low back pain: Secondary | ICD-10-CM | POA: Diagnosis not present

## 2023-03-01 DIAGNOSIS — M542 Cervicalgia: Secondary | ICD-10-CM | POA: Diagnosis not present

## 2023-03-01 DIAGNOSIS — Z5181 Encounter for therapeutic drug level monitoring: Secondary | ICD-10-CM | POA: Diagnosis not present

## 2023-03-01 DIAGNOSIS — G894 Chronic pain syndrome: Secondary | ICD-10-CM | POA: Diagnosis not present

## 2023-03-01 DIAGNOSIS — Z79891 Long term (current) use of opiate analgesic: Secondary | ICD-10-CM | POA: Diagnosis not present

## 2023-03-02 DIAGNOSIS — M25552 Pain in left hip: Secondary | ICD-10-CM | POA: Diagnosis not present

## 2023-03-02 DIAGNOSIS — M25512 Pain in left shoulder: Secondary | ICD-10-CM | POA: Diagnosis not present

## 2023-03-07 DIAGNOSIS — M7062 Trochanteric bursitis, left hip: Secondary | ICD-10-CM | POA: Diagnosis not present

## 2023-03-08 DIAGNOSIS — E1165 Type 2 diabetes mellitus with hyperglycemia: Secondary | ICD-10-CM | POA: Diagnosis not present

## 2023-03-08 DIAGNOSIS — Z794 Long term (current) use of insulin: Secondary | ICD-10-CM | POA: Diagnosis not present

## 2023-03-09 DIAGNOSIS — M25552 Pain in left hip: Secondary | ICD-10-CM | POA: Diagnosis not present

## 2023-03-09 DIAGNOSIS — M25512 Pain in left shoulder: Secondary | ICD-10-CM | POA: Diagnosis not present

## 2023-03-13 DIAGNOSIS — E237 Disorder of pituitary gland, unspecified: Secondary | ICD-10-CM | POA: Diagnosis not present

## 2023-03-13 DIAGNOSIS — B0052 Herpesviral keratitis: Secondary | ICD-10-CM | POA: Diagnosis not present

## 2023-03-13 DIAGNOSIS — E236 Other disorders of pituitary gland: Secondary | ICD-10-CM | POA: Diagnosis not present

## 2023-03-13 DIAGNOSIS — I10 Essential (primary) hypertension: Secondary | ICD-10-CM | POA: Diagnosis not present

## 2023-03-13 DIAGNOSIS — D352 Benign neoplasm of pituitary gland: Secondary | ICD-10-CM | POA: Diagnosis not present

## 2023-03-15 DIAGNOSIS — M25512 Pain in left shoulder: Secondary | ICD-10-CM | POA: Diagnosis not present

## 2023-03-15 DIAGNOSIS — M25552 Pain in left hip: Secondary | ICD-10-CM | POA: Diagnosis not present

## 2023-03-20 DIAGNOSIS — M25512 Pain in left shoulder: Secondary | ICD-10-CM | POA: Diagnosis not present

## 2023-03-20 DIAGNOSIS — M25552 Pain in left hip: Secondary | ICD-10-CM | POA: Diagnosis not present

## 2023-03-28 DIAGNOSIS — M25512 Pain in left shoulder: Secondary | ICD-10-CM | POA: Diagnosis not present

## 2023-03-28 DIAGNOSIS — M25552 Pain in left hip: Secondary | ICD-10-CM | POA: Diagnosis not present

## 2023-04-05 DIAGNOSIS — G894 Chronic pain syndrome: Secondary | ICD-10-CM | POA: Diagnosis not present

## 2023-04-05 DIAGNOSIS — Z79891 Long term (current) use of opiate analgesic: Secondary | ICD-10-CM | POA: Diagnosis not present

## 2023-04-05 DIAGNOSIS — F4542 Pain disorder with related psychological factors: Secondary | ICD-10-CM | POA: Diagnosis not present

## 2023-04-05 DIAGNOSIS — M542 Cervicalgia: Secondary | ICD-10-CM | POA: Diagnosis not present

## 2023-04-05 DIAGNOSIS — F3289 Other specified depressive episodes: Secondary | ICD-10-CM | POA: Diagnosis not present

## 2023-04-05 DIAGNOSIS — Z5181 Encounter for therapeutic drug level monitoring: Secondary | ICD-10-CM | POA: Diagnosis not present

## 2023-04-05 DIAGNOSIS — F5101 Primary insomnia: Secondary | ICD-10-CM | POA: Diagnosis not present

## 2023-04-05 DIAGNOSIS — M5459 Other low back pain: Secondary | ICD-10-CM | POA: Diagnosis not present

## 2023-04-16 DIAGNOSIS — Z5181 Encounter for therapeutic drug level monitoring: Secondary | ICD-10-CM | POA: Diagnosis not present

## 2023-04-16 DIAGNOSIS — G894 Chronic pain syndrome: Secondary | ICD-10-CM | POA: Diagnosis not present

## 2023-04-16 DIAGNOSIS — Z79891 Long term (current) use of opiate analgesic: Secondary | ICD-10-CM | POA: Diagnosis not present

## 2023-04-16 DIAGNOSIS — M5459 Other low back pain: Secondary | ICD-10-CM | POA: Diagnosis not present

## 2023-04-24 DIAGNOSIS — M791 Myalgia, unspecified site: Secondary | ICD-10-CM | POA: Diagnosis not present

## 2023-04-24 DIAGNOSIS — Z7960 Long term (current) use of unspecified immunomodulators and immunosuppressants: Secondary | ICD-10-CM | POA: Diagnosis not present

## 2023-04-24 DIAGNOSIS — M0609 Rheumatoid arthritis without rheumatoid factor, multiple sites: Secondary | ICD-10-CM | POA: Diagnosis not present

## 2023-04-24 DIAGNOSIS — M1A09X0 Idiopathic chronic gout, multiple sites, without tophus (tophi): Secondary | ICD-10-CM | POA: Diagnosis not present

## 2023-05-03 DIAGNOSIS — F5101 Primary insomnia: Secondary | ICD-10-CM | POA: Diagnosis not present

## 2023-05-03 DIAGNOSIS — M5459 Other low back pain: Secondary | ICD-10-CM | POA: Diagnosis not present

## 2023-05-03 DIAGNOSIS — F4542 Pain disorder with related psychological factors: Secondary | ICD-10-CM | POA: Diagnosis not present

## 2023-05-03 DIAGNOSIS — F3289 Other specified depressive episodes: Secondary | ICD-10-CM | POA: Diagnosis not present

## 2023-05-03 DIAGNOSIS — M542 Cervicalgia: Secondary | ICD-10-CM | POA: Diagnosis not present

## 2023-05-03 DIAGNOSIS — Z5181 Encounter for therapeutic drug level monitoring: Secondary | ICD-10-CM | POA: Diagnosis not present

## 2023-05-03 DIAGNOSIS — Z79891 Long term (current) use of opiate analgesic: Secondary | ICD-10-CM | POA: Diagnosis not present

## 2023-05-03 DIAGNOSIS — G894 Chronic pain syndrome: Secondary | ICD-10-CM | POA: Diagnosis not present

## 2023-05-10 DIAGNOSIS — Z794 Long term (current) use of insulin: Secondary | ICD-10-CM | POA: Diagnosis not present

## 2023-05-10 DIAGNOSIS — Z6835 Body mass index (BMI) 35.0-35.9, adult: Secondary | ICD-10-CM | POA: Diagnosis not present

## 2023-05-10 DIAGNOSIS — Z7984 Long term (current) use of oral hypoglycemic drugs: Secondary | ICD-10-CM | POA: Diagnosis not present

## 2023-05-10 DIAGNOSIS — Z79899 Other long term (current) drug therapy: Secondary | ICD-10-CM | POA: Diagnosis not present

## 2023-05-10 DIAGNOSIS — I10 Essential (primary) hypertension: Secondary | ICD-10-CM | POA: Diagnosis not present

## 2023-05-10 DIAGNOSIS — E66812 Obesity, class 2: Secondary | ICD-10-CM | POA: Diagnosis not present

## 2023-05-10 DIAGNOSIS — E785 Hyperlipidemia, unspecified: Secondary | ICD-10-CM | POA: Diagnosis not present

## 2023-05-10 DIAGNOSIS — E1142 Type 2 diabetes mellitus with diabetic polyneuropathy: Secondary | ICD-10-CM | POA: Diagnosis not present

## 2023-05-10 DIAGNOSIS — D352 Benign neoplasm of pituitary gland: Secondary | ICD-10-CM | POA: Diagnosis not present

## 2023-05-10 DIAGNOSIS — E1165 Type 2 diabetes mellitus with hyperglycemia: Secondary | ICD-10-CM | POA: Diagnosis not present

## 2023-05-14 DIAGNOSIS — R0789 Other chest pain: Secondary | ICD-10-CM | POA: Diagnosis not present

## 2023-05-14 DIAGNOSIS — E66811 Obesity, class 1: Secondary | ICD-10-CM | POA: Diagnosis not present

## 2023-05-14 DIAGNOSIS — I10 Essential (primary) hypertension: Secondary | ICD-10-CM | POA: Diagnosis not present

## 2023-05-14 DIAGNOSIS — R0602 Shortness of breath: Secondary | ICD-10-CM | POA: Diagnosis not present

## 2023-05-16 DIAGNOSIS — B0229 Other postherpetic nervous system involvement: Secondary | ICD-10-CM

## 2023-05-16 DIAGNOSIS — M10042 Idiopathic gout, left hand: Secondary | ICD-10-CM

## 2023-05-16 HISTORY — DX: Idiopathic gout, left hand: M10.042

## 2023-05-16 HISTORY — DX: Other postherpetic nervous system involvement: B02.29

## 2023-05-28 DIAGNOSIS — R0789 Other chest pain: Secondary | ICD-10-CM | POA: Diagnosis not present

## 2023-05-28 DIAGNOSIS — R0602 Shortness of breath: Secondary | ICD-10-CM | POA: Diagnosis not present

## 2023-05-30 DIAGNOSIS — Z5181 Encounter for therapeutic drug level monitoring: Secondary | ICD-10-CM | POA: Diagnosis not present

## 2023-05-30 DIAGNOSIS — G894 Chronic pain syndrome: Secondary | ICD-10-CM | POA: Diagnosis not present

## 2023-05-30 DIAGNOSIS — M5459 Other low back pain: Secondary | ICD-10-CM | POA: Diagnosis not present

## 2023-05-30 DIAGNOSIS — Z79891 Long term (current) use of opiate analgesic: Secondary | ICD-10-CM | POA: Diagnosis not present

## 2023-05-31 DIAGNOSIS — Z5181 Encounter for therapeutic drug level monitoring: Secondary | ICD-10-CM | POA: Diagnosis not present

## 2023-05-31 DIAGNOSIS — Z79891 Long term (current) use of opiate analgesic: Secondary | ICD-10-CM | POA: Diagnosis not present

## 2023-05-31 DIAGNOSIS — M542 Cervicalgia: Secondary | ICD-10-CM | POA: Diagnosis not present

## 2023-05-31 DIAGNOSIS — M5459 Other low back pain: Secondary | ICD-10-CM | POA: Diagnosis not present

## 2023-05-31 DIAGNOSIS — G894 Chronic pain syndrome: Secondary | ICD-10-CM | POA: Diagnosis not present

## 2023-06-06 DIAGNOSIS — M75120 Complete rotator cuff tear or rupture of unspecified shoulder, not specified as traumatic: Secondary | ICD-10-CM | POA: Insufficient documentation

## 2023-06-06 DIAGNOSIS — G5600 Carpal tunnel syndrome, unspecified upper limb: Secondary | ICD-10-CM | POA: Insufficient documentation

## 2023-06-06 DIAGNOSIS — E1142 Type 2 diabetes mellitus with diabetic polyneuropathy: Secondary | ICD-10-CM | POA: Insufficient documentation

## 2023-06-07 ENCOUNTER — Ambulatory Visit (INDEPENDENT_AMBULATORY_CARE_PROVIDER_SITE_OTHER): Payer: Medicare Other | Admitting: Family Medicine

## 2023-06-07 ENCOUNTER — Encounter: Payer: Self-pay | Admitting: Family Medicine

## 2023-06-07 VITALS — BP 133/75 | HR 78 | Temp 97.9°F | Resp 16 | Ht 66.0 in | Wt 219.0 lb

## 2023-06-07 DIAGNOSIS — E1129 Type 2 diabetes mellitus with other diabetic kidney complication: Secondary | ICD-10-CM

## 2023-06-07 DIAGNOSIS — G894 Chronic pain syndrome: Secondary | ICD-10-CM | POA: Insufficient documentation

## 2023-06-07 DIAGNOSIS — E782 Mixed hyperlipidemia: Secondary | ICD-10-CM | POA: Diagnosis not present

## 2023-06-07 DIAGNOSIS — Z1231 Encounter for screening mammogram for malignant neoplasm of breast: Secondary | ICD-10-CM

## 2023-06-07 DIAGNOSIS — Z13820 Encounter for screening for osteoporosis: Secondary | ICD-10-CM

## 2023-06-07 DIAGNOSIS — R809 Proteinuria, unspecified: Secondary | ICD-10-CM | POA: Diagnosis not present

## 2023-06-07 DIAGNOSIS — Z794 Long term (current) use of insulin: Secondary | ICD-10-CM

## 2023-06-07 DIAGNOSIS — I10 Essential (primary) hypertension: Secondary | ICD-10-CM | POA: Insufficient documentation

## 2023-06-07 DIAGNOSIS — M10042 Idiopathic gout, left hand: Secondary | ICD-10-CM | POA: Diagnosis not present

## 2023-06-07 DIAGNOSIS — E1140 Type 2 diabetes mellitus with diabetic neuropathy, unspecified: Secondary | ICD-10-CM | POA: Insufficient documentation

## 2023-06-07 DIAGNOSIS — R911 Solitary pulmonary nodule: Secondary | ICD-10-CM

## 2023-06-07 DIAGNOSIS — Z780 Asymptomatic menopausal state: Secondary | ICD-10-CM | POA: Diagnosis not present

## 2023-06-07 DIAGNOSIS — M47817 Spondylosis without myelopathy or radiculopathy, lumbosacral region: Secondary | ICD-10-CM | POA: Insufficient documentation

## 2023-06-07 DIAGNOSIS — H8111 Benign paroxysmal vertigo, right ear: Secondary | ICD-10-CM | POA: Insufficient documentation

## 2023-06-07 DIAGNOSIS — D352 Benign neoplasm of pituitary gland: Secondary | ICD-10-CM

## 2023-06-07 MED ORDER — MECLIZINE HCL 25 MG PO TABS: 25.0000 mg | ORAL_TABLET | Freq: Three times a day (TID) | ORAL | 1 refills | Status: AC | PRN

## 2023-06-07 NOTE — Assessment & Plan Note (Signed)
Dilated by both endocrinology and neurosurgery.  No intervention at this time.
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
## 2023-06-07 NOTE — Assessment & Plan Note (Signed)
Offered her physical therapy but she declined.  Meclizine refill.

## 2023-06-07 NOTE — Assessment & Plan Note (Signed)
Kircher take her allopurinol daily.  Uric acid level today

## 2023-06-07 NOTE — Assessment & Plan Note (Signed)
You are scheduled for a mammogram

## 2023-06-07 NOTE — Assessment & Plan Note (Signed)
Followed by endocrinology.  Humalog 30 mL 30 units with meals and Basaglar 60 units in the morning and 40 units in the evening.  Takes an 81 mg aspirin daily recently stopped [place].

## 2023-06-07 NOTE — Assessment & Plan Note (Signed)
Is on Lipitor 80 mg daily.  Will check her lipids and CMP

## 2023-06-07 NOTE — Assessment & Plan Note (Signed)
Incidental finding of a 1.5 cm nodular density in the right upper lobe.  Has not had a follow-up CT scan.  Will get this arranged

## 2023-06-08 ENCOUNTER — Encounter: Payer: Self-pay | Admitting: Family Medicine

## 2023-06-08 ENCOUNTER — Telehealth: Payer: Self-pay

## 2023-06-08 DIAGNOSIS — M10042 Idiopathic gout, left hand: Secondary | ICD-10-CM

## 2023-06-08 LAB — CMP14+EGFR
ALT: 9 [IU]/L (ref 0–32)
AST: 10 [IU]/L (ref 0–40)
Albumin: 4.1 g/dL (ref 3.9–4.9)
Alkaline Phosphatase: 89 [IU]/L (ref 44–121)
BUN/Creatinine Ratio: 8 — ABNORMAL LOW (ref 12–28)
BUN: 9 mg/dL (ref 8–27)
Bilirubin Total: 0.4 mg/dL (ref 0.0–1.2)
CO2: 27 mmol/L (ref 20–29)
Calcium: 9.7 mg/dL (ref 8.7–10.3)
Chloride: 100 mmol/L (ref 96–106)
Creatinine, Ser: 1.07 mg/dL — ABNORMAL HIGH (ref 0.57–1.00)
Globulin, Total: 2.6 g/dL (ref 1.5–4.5)
Glucose: 238 mg/dL — ABNORMAL HIGH (ref 70–99)
Potassium: 3.5 mmol/L (ref 3.5–5.2)
Sodium: 143 mmol/L (ref 134–144)
Total Protein: 6.7 g/dL (ref 6.0–8.5)
eGFR: 57 mL/min/{1.73_m2} — ABNORMAL LOW (ref >59)

## 2023-06-08 LAB — LIPID PANEL
Chol/HDL Ratio: 6.3 {ratio} — ABNORMAL HIGH (ref 0.0–4.4)
Cholesterol, Total: 182 mg/dL (ref 100–199)
HDL: 29 mg/dL — ABNORMAL LOW (ref >39)
LDL Chol Calc (NIH): 115 mg/dL — ABNORMAL HIGH (ref 0–99)
Triglycerides: 215 mg/dL — ABNORMAL HIGH (ref 0–149)
VLDL Cholesterol Cal: 38 mg/dL (ref 5–40)

## 2023-06-08 LAB — MICROALBUMIN / CREATININE URINE RATIO
Creatinine, Urine: 230.5 mg/dL
Microalb/Creat Ratio: 3 mg/g{creat} (ref 0–29)
Microalbumin, Urine: 7.3 ug/mL

## 2023-06-08 LAB — HEMOGLOBIN A1C
Est. average glucose Bld gHb Est-mCnc: 192 mg/dL
Hgb A1c MFr Bld: 8.3 % — ABNORMAL HIGH (ref 4.8–5.6)

## 2023-06-08 LAB — URIC ACID: Uric Acid: 7.5 mg/dL — ABNORMAL HIGH (ref 3.0–7.2)

## 2023-06-08 MED ORDER — PREDNISONE 10 MG (21) PO TBPK: ORAL_TABLET | ORAL | 0 refills | Status: DC

## 2023-06-08 NOTE — Telephone Encounter (Signed)
Sent in prednisone dosepack as requested.

## 2023-06-08 NOTE — Telephone Encounter (Signed)
Spoke with patient and reviewed her labs (and husband Roy's) Patient requested prednisone course to help with her gout pain, she expressed compliance with allopurinol but stated prednisone would help her through and she would greatly appreciate it. Sent message to Dr. Girtha Rm and will call patient when a determination has been made.

## 2023-06-12 ENCOUNTER — Other Ambulatory Visit: Payer: Self-pay | Admitting: Internal Medicine

## 2023-06-12 DIAGNOSIS — R0602 Shortness of breath: Secondary | ICD-10-CM

## 2023-06-12 DIAGNOSIS — R079 Chest pain, unspecified: Secondary | ICD-10-CM

## 2023-06-20 ENCOUNTER — Telehealth (HOSPITAL_COMMUNITY): Payer: Self-pay | Admitting: *Deleted

## 2023-06-20 ENCOUNTER — Other Ambulatory Visit (HOSPITAL_COMMUNITY): Payer: Self-pay | Admitting: *Deleted

## 2023-06-20 ENCOUNTER — Encounter (HOSPITAL_COMMUNITY): Payer: Self-pay

## 2023-06-20 ENCOUNTER — Telehealth: Payer: Self-pay

## 2023-06-20 MED ORDER — METOPROLOL TARTRATE 100 MG PO TABS: ORAL_TABLET | ORAL | 0 refills | Status: DC

## 2023-06-20 NOTE — Telephone Encounter (Signed)
 Attempted to call patient regarding upcoming cardiac CT appointment. Left message on voicemail with name and callback number Johney Frame RN Navigator Cardiac Imaging [facility] [phone] Office

## 2023-06-20 NOTE — Telephone Encounter (Signed)
 Copied from CRM [id number]. Topic: Appointments - Scheduling Inquiry for Clinic >> Jun 19, 2023 10:48 AM Farrel B wrote: Reason for CRM: Prior Authorization request please call [phone] at extension [phone]  Reason for CRM: Prior Authorization request please call [phone] at extension [phone]

## 2023-06-20 NOTE — Progress Notes (Signed)
 Left message for patient-advised if PA needed for one time dose of metoprolol  (for CT scan), request to pay out of pocket for tablet.   Advised to call back with any questions.

## 2023-06-20 NOTE — Telephone Encounter (Signed)
 Copied from CRM [id number]. Topic: Clinical - Request for Lab/Test Order >> Jun 20, 2023  8:43 AM Myrick T wrote: Reason for CRM: Marjorie from [facility] called stated this was her 2nd attempt to get a CT Authorization for a CT that is scheduled for tomorrow, Thursday 2/6. Please f/u with Kendra at [phone] ext [phone].

## 2023-06-20 NOTE — Telephone Encounter (Signed)
 Received call from patient regarding upcoming cardiac imaging study; pt verbalizes understanding of appt date/time, parking situation and where to check in, pre-test NPO status and medications ordered, and verified current allergies; name and call back number provided for further questions should they arise Sid Seats RN Navigator Cardiac Imaging [facility] [phone] office [phone] cell

## 2023-06-21 ENCOUNTER — Ambulatory Visit
Admission: RE | Admit: 2023-06-21 | Discharge: 2023-06-21 | Disposition: A | Discharge status: Home / Self Care | Payer: Medicare Other | Source: Ambulatory Visit | Attending: Internal Medicine | Admitting: Internal Medicine

## 2023-06-21 DIAGNOSIS — R079 Chest pain, unspecified: Secondary | ICD-10-CM | POA: Diagnosis not present

## 2023-06-21 DIAGNOSIS — R0602 Shortness of breath: Secondary | ICD-10-CM | POA: Insufficient documentation

## 2023-06-21 DIAGNOSIS — I10 Essential (primary) hypertension: Secondary | ICD-10-CM | POA: Insufficient documentation

## 2023-06-21 DIAGNOSIS — R002 Palpitations: Secondary | ICD-10-CM | POA: Insufficient documentation

## 2023-06-21 MED ORDER — IOHEXOL 350 MG/ML SOLN
100.0000 mL | Freq: Once | INTRAVENOUS | Status: AC | PRN
Start: 2023-06-21 — End: 2023-06-21
  Administered 2023-06-21: 100 mL via INTRAVENOUS

## 2023-06-21 MED ORDER — NITROGLYCERIN 0.4 MG SL SUBL
0.8000 mg | SUBLINGUAL_TABLET | Freq: Once | SUBLINGUAL | Status: AC
  Administered 2023-06-21: 0.8 mg via SUBLINGUAL

## 2023-06-21 MED ORDER — SODIUM CHLORIDE 0.9 % IV SOLN: INTRAVENOUS | Status: DC

## 2023-06-21 NOTE — Progress Notes (Signed)
 Patient tolerated procedure well. Ambulate w/o difficulty. Denies light headedness or being dizzy. Sitting up drinking water provided. Encouraged to drink extra water today and reasoning explained. Verbalized understanding. All questions answered. ABC intact. No further needs. Discharge from procedure area w/o issues.

## 2023-06-25 DIAGNOSIS — M65341 Trigger finger, right ring finger: Secondary | ICD-10-CM | POA: Diagnosis not present

## 2023-06-25 DIAGNOSIS — M7062 Trochanteric bursitis, left hip: Secondary | ICD-10-CM | POA: Diagnosis not present

## 2023-07-05 DIAGNOSIS — Z79891 Long term (current) use of opiate analgesic: Secondary | ICD-10-CM | POA: Diagnosis not present

## 2023-07-05 DIAGNOSIS — Z5181 Encounter for therapeutic drug level monitoring: Secondary | ICD-10-CM | POA: Diagnosis not present

## 2023-07-05 DIAGNOSIS — G894 Chronic pain syndrome: Secondary | ICD-10-CM | POA: Diagnosis not present

## 2023-07-05 DIAGNOSIS — M5459 Other low back pain: Secondary | ICD-10-CM | POA: Diagnosis not present

## 2023-07-05 DIAGNOSIS — M542 Cervicalgia: Secondary | ICD-10-CM | POA: Diagnosis not present

## 2023-07-10 DIAGNOSIS — M7062 Trochanteric bursitis, left hip: Secondary | ICD-10-CM | POA: Diagnosis not present

## 2023-07-10 HISTORY — DX: Trochanteric bursitis, left hip: M70.62

## 2023-07-13 ENCOUNTER — Encounter: Payer: Self-pay | Admitting: Family Medicine

## 2023-07-13 ENCOUNTER — Ambulatory Visit (INDEPENDENT_AMBULATORY_CARE_PROVIDER_SITE_OTHER): Payer: Medicare Other | Admitting: Family Medicine

## 2023-07-13 ENCOUNTER — Telehealth: Payer: Self-pay | Admitting: Family Medicine

## 2023-07-13 VITALS — BP 135/86 | HR 75 | Temp 98.0°F | Resp 16 | Ht 66.0 in | Wt 217.6 lb

## 2023-07-13 DIAGNOSIS — M62838 Other muscle spasm: Secondary | ICD-10-CM | POA: Diagnosis not present

## 2023-07-13 NOTE — Telephone Encounter (Signed)
 Please give Mrs. Micallef a note for her husband.Thanks

## 2023-07-14 LAB — BASIC METABOLIC PANEL
BUN/Creatinine Ratio: 14 (ref 12–28)
BUN: 15 mg/dL (ref 8–27)
CO2: 27 mmol/L (ref 20–29)
Calcium: 10.3 mg/dL (ref 8.7–10.3)
Chloride: 100 mmol/L (ref 96–106)
Creatinine, Ser: 1.06 mg/dL — ABNORMAL HIGH (ref 0.57–1.00)
Glucose: 153 mg/dL — ABNORMAL HIGH (ref 70–99)
Potassium: 4.5 mmol/L (ref 3.5–5.2)
Sodium: 142 mmol/L (ref 134–144)
eGFR: 58 mL/min/{1.73_m2} — ABNORMAL LOW (ref >59)

## 2023-07-14 LAB — MAGNESIUM: Magnesium: 2.3 mg/dL (ref 1.6–2.3)

## 2023-07-15 DIAGNOSIS — M62838 Other muscle spasm: Secondary | ICD-10-CM | POA: Insufficient documentation

## 2023-07-15 NOTE — Progress Notes (Signed)
 Established Patient Office Visit  Subjective   Patient ID: Charlotte [name], female    DOB: 1955/11/29  Age: 68 y.o. MRN: [id number]  Chief Complaint  Patient presents with   Spasms    Legs x3days    HPI 68 year old woman with hypertension, mild LVH and grade 2 DD, DMT2 on insulin, sella turcica mass (pituitary microadenoma 1.8 cm, evaluated by NS and ENDO), gout, RA, pulmonary nodule, BPH. She reports 3 different nights that she went to bed and had terrible spasms in her feet and hands.  She had to get out of the bed and get up and walk to make it stop the second night she fell while she was trying to walk the cramps out.  She did not hit her head, no loss of consciousness and she has not had any neck pain.  She will get back in bed and the spasm would happen again.  She does not take any albuterol.  She has never had this happen before.She is on diuretics spironolactone and losartan HCTZ.    ROS    Objective:     BP 135/86   Pulse 75   Temp 98 F (36.7 C) (Oral)   Resp 16   Ht 5\' 6"  (1.676 m) Comment: per patient  Wt 217 lb 9.6 oz (98.7 kg)   SpO2 97%   BMI 35.12 kg/m    Physical Exam Vitals and nursing note reviewed.  Constitutional:      Appearance: Normal appearance.  HENT:     Head: Normocephalic and atraumatic.  Eyes:     Conjunctiva/sclera: Conjunctivae normal.  Cardiovascular:     Rate and Rhythm: Normal rate and regular rhythm.  Pulmonary:     Effort: Pulmonary effort is normal.     Breath sounds: Normal breath sounds.  Musculoskeletal:     Right lower leg: No edema.     Left lower leg: No edema.  Skin:    General: Skin is warm and dry.  Neurological:     Mental Status: She is alert and oriented to person, place, and time.  Psychiatric:        Mood and Affect: Mood normal.        Behavior: Behavior normal.        Thought Content: Thought content normal.        Judgment: Judgment normal.          Results for orders placed or performed in  visit on 07/13/23  Magnesium  Result Value Ref Range   Magnesium 2.3 1.6 - 2.3 mg/dL  Basic metabolic panel  Result Value Ref Range   Glucose 153 (H) 70 - 99 mg/dL   BUN 15 8 - 27 mg/dL   Creatinine, Ser 9.14 (H) 0.57 - 1.00 mg/dL   eGFR 58 (L) >78 GN/FAO/1.30   BUN/Creatinine Ratio 14 12 - 28   Sodium 142 134 - 144 mmol/L   Potassium 4.5 3.5 - 5.2 mmol/L   Chloride 100 96 - 106 mmol/L   CO2 27 20 - 29 mmol/L   Calcium 10.3 8.7 - 10.3 mg/dL      The 86-VHQI ASCVD risk score (Arnett DK, et al., 2019) is: 24.6%    Assessment & Plan:  Muscle spasms of both lower extremities Assessment & Plan: Usually potassium and sodium shifts caused by albuterol, diuretics.  May be magnesium.  Will check magnesium and potassium levels.  May ask her to drink Gatorade to replete sodium and potassium on the nights that  she has this.  Orders: -     Magnesium -     Basic metabolic panel     No follow-ups on file.    Alease Medina, MD

## 2023-07-15 NOTE — Assessment & Plan Note (Signed)
 Usually potassium and sodium shifts caused by albuterol, diuretics.  May be magnesium.  Will check magnesium and potassium levels.  May ask her to drink Gatorade to replete sodium and potassium on the nights that she has this.

## 2023-07-17 ENCOUNTER — Encounter: Payer: Self-pay | Admitting: Family Medicine

## 2023-07-17 DIAGNOSIS — M7062 Trochanteric bursitis, left hip: Secondary | ICD-10-CM | POA: Diagnosis not present

## 2023-07-19 DIAGNOSIS — M7062 Trochanteric bursitis, left hip: Secondary | ICD-10-CM | POA: Diagnosis not present

## 2023-07-24 DIAGNOSIS — M7062 Trochanteric bursitis, left hip: Secondary | ICD-10-CM | POA: Diagnosis not present

## 2023-07-26 DIAGNOSIS — M7062 Trochanteric bursitis, left hip: Secondary | ICD-10-CM | POA: Diagnosis not present

## 2023-07-31 DIAGNOSIS — M7062 Trochanteric bursitis, left hip: Secondary | ICD-10-CM | POA: Diagnosis not present

## 2023-08-02 DIAGNOSIS — M542 Cervicalgia: Secondary | ICD-10-CM | POA: Diagnosis not present

## 2023-08-02 DIAGNOSIS — M5459 Other low back pain: Secondary | ICD-10-CM | POA: Diagnosis not present

## 2023-08-02 DIAGNOSIS — Z5181 Encounter for therapeutic drug level monitoring: Secondary | ICD-10-CM | POA: Diagnosis not present

## 2023-08-02 DIAGNOSIS — G894 Chronic pain syndrome: Secondary | ICD-10-CM | POA: Diagnosis not present

## 2023-08-02 DIAGNOSIS — Z79891 Long term (current) use of opiate analgesic: Secondary | ICD-10-CM | POA: Diagnosis not present

## 2023-08-03 DIAGNOSIS — M7062 Trochanteric bursitis, left hip: Secondary | ICD-10-CM | POA: Diagnosis not present

## 2023-08-09 DIAGNOSIS — M7062 Trochanteric bursitis, left hip: Secondary | ICD-10-CM | POA: Diagnosis not present

## 2023-08-17 ENCOUNTER — Other Ambulatory Visit: Payer: Self-pay | Admitting: Family Medicine

## 2023-08-17 ENCOUNTER — Ambulatory Visit (INDEPENDENT_AMBULATORY_CARE_PROVIDER_SITE_OTHER)

## 2023-08-17 VITALS — BP 125/84 | HR 72 | Temp 98.6°F | Ht 66.0 in | Wt 223.0 lb

## 2023-08-17 DIAGNOSIS — Z0000 Encounter for general adult medical examination without abnormal findings: Secondary | ICD-10-CM | POA: Diagnosis not present

## 2023-08-17 DIAGNOSIS — Z5181 Encounter for therapeutic drug level monitoring: Secondary | ICD-10-CM | POA: Diagnosis not present

## 2023-08-17 DIAGNOSIS — I152 Hypertension secondary to endocrine disorders: Secondary | ICD-10-CM

## 2023-08-17 DIAGNOSIS — M5459 Other low back pain: Secondary | ICD-10-CM | POA: Diagnosis not present

## 2023-08-17 DIAGNOSIS — Z13820 Encounter for screening for osteoporosis: Secondary | ICD-10-CM | POA: Diagnosis not present

## 2023-08-17 DIAGNOSIS — Z1231 Encounter for screening mammogram for malignant neoplasm of breast: Secondary | ICD-10-CM

## 2023-08-17 DIAGNOSIS — G894 Chronic pain syndrome: Secondary | ICD-10-CM | POA: Diagnosis not present

## 2023-08-17 DIAGNOSIS — K219 Gastro-esophageal reflux disease without esophagitis: Secondary | ICD-10-CM

## 2023-08-17 DIAGNOSIS — Z79891 Long term (current) use of opiate analgesic: Secondary | ICD-10-CM | POA: Diagnosis not present

## 2023-08-17 NOTE — Progress Notes (Addendum)
 Subjective:   Charlotte [name] is a 68 y.o. female who presents for Medicare Annual (Subsequent) preventive examination.  Visit Complete: In person  Patient Medicare AWV questionnaire was completed by the patient on 08/17/2023; I have confirmed that all information answered by patient is correct and no changes since this date.        Objective:    There were no vitals filed for this visit. There is no height or weight on file to calculate BMI.     02/12/2023    1:59 AM 03/01/2022   12:36 PM 01/17/2022    9:01 AM 07/16/2020   12:27 PM 01/13/2020   12:48 PM 01/13/2019   10:18 AM 10/29/2016    3:57 PM  Advanced Directives  Does Patient Have a Medical Advance Directive? No No Yes Yes No No No;Yes  Type of Advance Directive    Living will   Living will  Would patient like information on creating a medical advance directive? No - Patient declined   No - Guardian declined  No - Patient declined No - Patient declined    Current Medications (verified) Outpatient Encounter Medications as of 08/17/2023  Medication Sig   allopurinol (ZYLOPRIM) 100 MG tablet Take 200 mg by mouth daily.   amLODipine (NORVASC) 10 MG tablet TAKE 1 TABLET(10 MG TOTAL) BY MOUTH DAILY.   aspirin 81 MG chewable tablet Chew by mouth daily.   atorvastatin (LIPITOR) 40 MG tablet Take 40 mg by mouth daily. (Patient not taking: Reported on 07/13/2023)   atorvastatin (LIPITOR) 80 MG tablet Take by mouth.   Blood Glucose Monitoring Suppl (ONE TOUCH ULTRA MINI) w/Device KIT Use to check blood sugars three times a day   carvedilol (COREG) 6.25 MG tablet Take 6.25 mg by mouth 2 (two) times daily with a meal.   Continuous Glucose Receiver (FREESTYLE LIBRE 2 READER) DEVI See admin instructions.   Continuous Glucose Sensor (FREESTYLE LIBRE 2 SENSOR) MISC USE 1 KIT EVERY 14 DAYS TO MONITOR GLUCOSE   diclofenac Sodium (VOLTAREN) 1 % GEL Apply topically 4 (four) times daily.   ezetimibe (ZETIA) 10 MG tablet Oral for 90 Days    fluticasone (FLONASE) 50 MCG/ACT nasal spray Place 2 sprays into both nostrils as needed for allergies.   gabapentin (NEURONTIN) 100 MG capsule Take 1 capsule by mouth at bedtime.   gabapentin (NEURONTIN) 300 MG capsule Limit 1-2 tabs by mouth 2-3 times per day if tolerated   glucose blood (ACCU-CHEK GUIDE TEST) test strip    glucose blood (ACCU-CHEK GUIDE) test strip    glucose blood (PRECISION QID TEST) test strip 1 each (1 strip total) 3 (three) times daily Use as instructed.   HYDROcodone-acetaminophen (NORCO) 7.5-325 MG tablet Take 1 tablet by mouth every 6 (six) hours as needed.   hydroxypropyl methylcellulose / hypromellose (ISOPTO TEARS / GONIOVISC) 2.5 % ophthalmic solution Place 1 drop into the right eye every hour as needed for dry eyes.   Insulin Glargine (BASAGLAR KWIKPEN) 100 UNIT/ML Inject 68 units AM and 75 units PM   insulin lispro (HUMALOG) 100 UNIT/ML KwikPen Take 30 units with meals plus correction as instructed. Max TDD 150 units.   KLOR-CON M20 20 MEQ tablet TAKE 1 BY MOUTH ONCE DAILY   leflunomide (ARAVA) 10 MG tablet Take 10 mg by mouth daily.   losartan-hydrochlorothiazide (HYZAAR) 100-25 MG tablet Take 1 tablet by mouth daily.   losartan-hydrochlorothiazide (HYZAAR) 50-12.5 MG tablet    meclizine (ANTIVERT) 25 MG tablet Take 1 tablet (25  mg total) by mouth 3 (three) times daily as needed for dizziness.   meloxicam (MOBIC) 15 MG tablet Take 1 tablet by mouth daily. (Patient not taking: Reported on 07/13/2023)   meloxicam (MOBIC) 7.5 MG tablet  (Patient not taking: Reported on 07/13/2023)   metFORMIN (GLUCOPHAGE-XR) 500 MG 24 hr tablet Take 500 mg by mouth daily.   naloxone (NARCAN) nasal spray 4 mg/0.1 mL    naproxen (NAPROSYN) 500 MG tablet TAKE 1 TABLET BY MOUTH TWICE DAILY WITH MEALS FOR 10 DAYS Oral for 10 Days (Patient not taking: Reported on 07/13/2023)   ofloxacin (OCUFLOX) 0.3 % ophthalmic solution INSTILL 1 TO 2 DROPS INTO RIGHT EYE 4 TIMES DAILY FOR 5 DAYS    Omega-3 Fatty Acids (FISH OIL) 300 MG CAPS    omeprazole (PRILOSEC) 40 MG capsule TAKE 1 CAPSULE BY MOUTH ONCE DAILY Oral for 90 Days   ondansetron (ZOFRAN-ODT) 4 MG disintegrating tablet Take 1 tablet (4 mg total) by mouth every 6 (six) hours as needed for nausea or vomiting.   polyethylene glycol (MIRALAX) packet Take 17 g by mouth daily.   senna-docusate (SENOKOT-S) 8.6-50 MG tablet Take 1 tablet by mouth 2 (two) times daily.   sertraline (ZOLOFT) 25 MG tablet Take 1 tablet by mouth daily.   spironolactone (ALDACTONE) 25 MG tablet Take 1 tablet by mouth daily.   sucralfate (CARAFATE) 1 G tablet Take 1 tablet by mouth 4 (four) times daily. Reported on 11/11/2015   valACYclovir (VALTREX) 1000 MG tablet Take 1,000 mg by mouth 2 (two) times daily.   No facility-administered encounter medications on file as of 08/17/2023.    Allergies (verified) Lisinopril-hydrochlorothiazide, Liraglutide, Pregabalin, Sitagliptin, Sulfadiazine, Ace inhibitors, and Sulfa antibiotics   History: Past Medical History:  Diagnosis Date   DDD (degenerative disc disease), lumbar    Depression    Diabetes mellitus without complication (HCC)    Diverticulitis    Fatty liver    Fibroid tumor    GERD (gastroesophageal reflux disease)    Gout    H pylori ulcer    Hemorrhoids    Hypercholesteremia    Hypertension    IBS (irritable bowel syndrome)    IDA (iron deficiency anemia)    OA (osteoarthritis)    Obesity    RA (rheumatoid arthritis) (HCC)    Sleep apnea    Spinal stenosis    Vitamin D deficiency    Past Surgical History:  Procedure Laterality Date   ABDOMINAL HYSTERECTOMY     partial   BACK SURGERY     CHOLECYSTECTOMY     COLONOSCOPY WITH PROPOFOL N/A 08/18/2015   Procedure: COLONOSCOPY WITH PROPOFOL;  Surgeon: Scot Jun, MD;  Location: [facility] ENDOSCOPY;  Service: Endoscopy;  Laterality: N/A;   COLONOSCOPY WITH PROPOFOL N/A 07/16/2020   Procedure: COLONOSCOPY WITH PROPOFOL;  Surgeon: Regis Bill, MD;  Location: [facility] ENDOSCOPY;  Service: Endoscopy;  Laterality: N/A;   ECTOPIC PREGNANCY SURGERY     ESOPHAGOGASTRODUODENOSCOPY (EGD) WITH PROPOFOL N/A 08/18/2015   Procedure: ESOPHAGOGASTRODUODENOSCOPY (EGD) WITH PROPOFOL;  Surgeon: Scot Jun, MD;  Location: [facility] ENDOSCOPY;  Service: Endoscopy;  Laterality: N/A;   ESOPHAGOGASTRODUODENOSCOPY (EGD) WITH PROPOFOL N/A 01/17/2022   Procedure: ESOPHAGOGASTRODUODENOSCOPY (EGD) WITH PROPOFOL;  Surgeon: Regis Bill, MD;  Location: [facility] ENDOSCOPY;  Service: Endoscopy;  Laterality: N/A;  DM   FOOT SURGERY     incision tendon sheath for trigger finger     NECK SURGERY     ROTATOR CUFF REPAIR Right  SHOULDER SURGERY Right    SPINE SURGERY     neck x2 and lumbar spine x1   uterine fibroid removed     Family History  Problem Relation Age of Onset   Diabetes Mother    Diabetes Father    Kidney disease Father    Diabetes Sister    Kidney disease Brother        Dialysis   Stroke Brother    Breast cancer Neg Hx    Social History   Socioeconomic History   Marital status: Married    Spouse name: Not on file   Number of children: Not on file   Years of education: Not on file   Highest education level: Not on file  Occupational History   Not on file  Tobacco Use   Smoking status: Former    Current packs/day: 0.00    Average packs/day: 0.5 packs/day for 33.0 years (16.5 ttl pk-yrs)    Types: Cigarettes    Start date: 05/15/1972    Quit date: 09/20/2004    Years since quitting: 18.9   Smokeless tobacco: Never  Vaping Use   Vaping status: Never Used  Substance and Sexual Activity   Alcohol use: No    Alcohol/week: 0.0 standard drinks of alcohol   Drug use: No   Sexual activity: Yes    Partners: Male  Other Topics Concern   Not on file  Social History Narrative   Not on file   Social Drivers of Health   Financial Resource Strain: Low Risk  (02/08/2023)   Received from [facility]   Overall  Financial Resource Strain (CARDIA)    Difficulty of Paying Living Expenses: Not very hard  Food Insecurity: Food Insecurity Present (02/08/2023)   Received from [facility]   Hunger Vital Sign    Worried About Running Out of Food in the Last Year: Sometimes true    Ran Out of Food in the Last Year: Sometimes true  Transportation Needs: No Transportation Needs (02/08/2023)   Received from [facility]   PRAPARE - Transportation    In the past 12 months, has lack of transportation kept you from medical appointments or from getting medications?: No    Lack of Transportation (Non-Medical): No  Physical Activity: Insufficiently Active (03/05/2021)   Received from [facility], [facility]   Exercise Vital Sign    Days of Exercise per Week: 2 days    Minutes of Exercise per Session: 20 min  Stress: Stress Concern Present (03/05/2021)   Received from [facility], [facility]   [employer] of Occupational Health - Occupational Stress Questionnaire    Feeling of Stress : To some extent  Social Connections: Moderately Integrated (01/12/2020)   Received from [facility], [facility]   Social Connection and Isolation Panel [NHANES]    Frequency of Communication with Friends and Family: Three times a week    Frequency of Social Gatherings with Friends and Family: Once a week    Attends Religious Services: 1 to 4 times per year    Active Member of [employer] or Organizations: No    Attends Banker Meetings: Never    Marital Status: Married    Tobacco Counseling Counseling given: Not Answered   Clinical Intake:  Activities of Daily Living     No data to display           Patient Care Team: Ziglar, Eli Phillips, MD as PCP - General (Family Medicine)  Indicate any recent Medical Services you may have  received from other than [facility] providers in the past year (date may be approximate).     Assessment:   This is a routine wellness examination for Tyreka.  Hearing/Vision screen No results found.   Goals Addressed   None   Depression Screen    07/13/2023   11:16 AM 06/07/2023    9:00 AM 01/03/2016    7:56 AM 11/11/2015    8:13 AM 09/16/2015    7:59 AM 08/17/2015    8:02 AM 07/15/2015    7:08 AM  PHQ 2/9 Scores  PHQ - 2 Score 1 0 0 0 0 0 0  PHQ- 9 Score 1 0       Exception Documentation       Patient refusal    Fall Risk    07/13/2023   11:16 AM 06/07/2023    8:59 AM 01/03/2016    7:56 AM 11/11/2015    8:13 AM 10/12/2015    8:11 AM  Fall Risk   Falls in the past year? 0 1 -- -- No  Comment   denies since last visit  no visit since lst visit   Number falls in past yr:  0     Injury with Fall?  0     Risk for fall due to : Orthopedic patient;Impaired balance/gait History of fall(s);Orthopedic patient     Follow up Falls prevention discussed;Education provided Falls prevention discussed;Education provided;Falls evaluation completed       MEDICARE RISK AT HOME:    TIMED UP AND GO:  Was the test performed?  Yes  Length of time to ambulate 10 feet: 10-15 sec Gait steady and fast without use of assistive device    Cognitive Function:        Immunizations Immunization History  Administered Date(s) Administered   Hep A / Hep B 07/28/2015, 09/21/2015   Influenza, Seasonal, Injecte, Preservative Fre 01/24/2010, 02/08/2012   Influenza,inj,Quad PF,6+ Mos 01/16/2013, 04/17/2014, 03/04/2015, 03/21/2016, 04/09/2017, 06/11/2018, 01/17/2019, 02/23/2020, 02/07/2021   Influenza-Unspecified 04/17/2014, 03/04/2015, 04/09/2017, 06/11/2018, 01/17/2019, 02/07/2022   PFIZER Comirnaty(Gray Top)Covid-19 Tri-Sucrose Vaccine 08/06/2019, 08/27/2019, 01/12/2020   Pneumococcal Conjugate-13 12/21/2009, 08/06/2014   Pneumococcal Polysaccharide-23 12/21/2009   Td 12/21/2009   Tdap 12/21/2009   Zoster  Recombinant(Shingrix) 07/09/2018, 01/17/2019   Zoster, Live 08/06/2014    TDAP status: Due, Education has been provided regarding the importance of this vaccine. Advised may receive this vaccine at local pharmacy or Health Dept. Aware to provide a copy of the vaccination record if obtained from local pharmacy or Health Dept. Verbalized acceptance and understanding.  Flu Vaccine status: Declined, Education has been provided regarding the importance of this vaccine but patient still declined. Advised may receive this vaccine at local pharmacy or Health Dept. Aware to provide a copy of the vaccination record if obtained from local pharmacy or Health Dept. Verbalized acceptance and understanding.  Pneumococcal vaccine status: Up to date  Covid-19 vaccine status: Information provided on how to obtain vaccines.   Qualifies for Shingles Vaccine? Yes   Zostavax completed Yes   Shingrix Completed?: Yes  Screening Tests Health Maintenance  Topic Date Due   OPHTHALMOLOGY EXAM  Never done   DEXA SCAN  Never done   COVID-19 Vaccine (4 - 2024-25 season) 01/14/2023  MAMMOGRAM  06/28/2023   DTaP/Tdap/Td (3 - Td or Tdap) 06/06/2024 (Originally 12/22/2019)   Pneumonia Vaccine 49+ Years old (3 of 3 - PPSV23 or PCV20) 06/06/2024 (Originally 10/27/2021)   Hepatitis C Screening  06/06/2024 (Originally 10/28/1973)   HEMOGLOBIN A1C  12/05/2023   INFLUENZA VACCINE  12/14/2023   Diabetic kidney evaluation - Urine ACR  06/06/2024   FOOT EXAM  06/06/2024   Diabetic kidney evaluation - eGFR measurement  07/12/2024   Medicare Annual Wellness (AWV)  08/16/2024   Colonoscopy  07/17/2030   Zoster Vaccines- Shingrix  Completed   HPV VACCINES  Aged Out    Health Maintenance  Health Maintenance Due  Topic Date Due   OPHTHALMOLOGY EXAM  Never done   DEXA SCAN  Never done   COVID-19 Vaccine (4 - 2024-25 season) 01/14/2023   MAMMOGRAM  06/28/2023    Colorectal cancer screening: Type of screening: Colonoscopy.  Completed 07/16/2020. Repeat every 10 years  Mammogram status: Ordered 08/17/2023. Pt provided with contact info and advised to call to schedule appt.   Bone Density status: Ordered 08/17/2023. Pt provided with contact info and advised to call to schedule appt.  Lung Cancer Screening: (Low Dose CT Chest recommended if Age 62-80 years, 20 pack-year currently smoking OR have quit w/in 15years.) does qualify.   Lung Cancer Screening Referral: placed on 06/07/23  Additional Screening:  Hepatitis C Screening: does not qualify;   Vision Screening: Recommended annual ophthalmology exams for early detection of glaucoma and other disorders of the eye. Is the patient up to date with their annual eye exam?  Yes  Who is the provider or what is the name of the office in which the patient attends annual eye exams? [facility] If pt is not established with a provider, would they like to be referred to a provider to establish care? No .   Dental Screening: Recommended annual dental exams for proper oral hygiene  Diabetic Foot Exam: Diabetic Foot Exam: Completed 06/07/2023  Community Resource Referral / Chronic Care Management: CRR required this visit?  No   CCM required this visit?  No     Plan:     I have personally reviewed and noted the following in the patient's chart:   Medical and social history Use of alcohol, tobacco or illicit drugs  Current medications and supplements including opioid prescriptions. Patient is currently taking opioid prescriptions. Information provided to patient regarding non-opioid alternatives. Patient advised to discuss non-opioid treatment plan with their provider. Functional ability and status Nutritional status Physical activity Advanced directives List of other physicians Hospitalizations, surgeries, and ER visits in previous 12 months Vitals Screenings to include cognitive, depression, and falls Referrals and appointments  In addition, I have  reviewed and discussed with patient certain preventive protocols, quality metrics, and best practice recommendations. A written personalized care plan for preventive services as well as general preventive health recommendations were provided to patient.     Tonny Bollman, CMA   08/17/2023   After Visit Summary: (In Person-Printed) AVS printed and given to the patient

## 2023-08-20 DIAGNOSIS — M7062 Trochanteric bursitis, left hip: Secondary | ICD-10-CM | POA: Diagnosis not present

## 2023-08-21 ENCOUNTER — Encounter: Payer: Self-pay | Admitting: Family Medicine

## 2023-08-23 DIAGNOSIS — M7062 Trochanteric bursitis, left hip: Secondary | ICD-10-CM | POA: Diagnosis not present

## 2023-08-24 MED ORDER — OMEPRAZOLE 40 MG PO CPDR: 40.0000 mg | DELAYED_RELEASE_CAPSULE | Freq: Every day | ORAL | 3 refills | Status: DC

## 2023-08-24 MED ORDER — LOSARTAN POTASSIUM-HCTZ 100-25 MG PO TABS: 1.0000 | ORAL_TABLET | Freq: Every day | ORAL | 3 refills | Status: DC

## 2023-08-27 DIAGNOSIS — Z7960 Long term (current) use of unspecified immunomodulators and immunosuppressants: Secondary | ICD-10-CM | POA: Diagnosis not present

## 2023-08-27 DIAGNOSIS — M791 Myalgia, unspecified site: Secondary | ICD-10-CM | POA: Diagnosis not present

## 2023-08-27 DIAGNOSIS — M1A09X0 Idiopathic chronic gout, multiple sites, without tophus (tophi): Secondary | ICD-10-CM | POA: Diagnosis not present

## 2023-08-27 DIAGNOSIS — M0609 Rheumatoid arthritis without rheumatoid factor, multiple sites: Secondary | ICD-10-CM | POA: Diagnosis not present

## 2023-08-28 DIAGNOSIS — M7062 Trochanteric bursitis, left hip: Secondary | ICD-10-CM | POA: Diagnosis not present

## 2023-08-30 DIAGNOSIS — M542 Cervicalgia: Secondary | ICD-10-CM | POA: Diagnosis not present

## 2023-08-30 DIAGNOSIS — Z5181 Encounter for therapeutic drug level monitoring: Secondary | ICD-10-CM | POA: Diagnosis not present

## 2023-08-30 DIAGNOSIS — M5459 Other low back pain: Secondary | ICD-10-CM | POA: Diagnosis not present

## 2023-08-30 DIAGNOSIS — Z79891 Long term (current) use of opiate analgesic: Secondary | ICD-10-CM | POA: Diagnosis not present

## 2023-08-30 DIAGNOSIS — G894 Chronic pain syndrome: Secondary | ICD-10-CM | POA: Diagnosis not present

## 2023-08-31 DIAGNOSIS — M7062 Trochanteric bursitis, left hip: Secondary | ICD-10-CM | POA: Diagnosis not present

## 2023-09-03 DIAGNOSIS — M7062 Trochanteric bursitis, left hip: Secondary | ICD-10-CM | POA: Diagnosis not present

## 2023-09-04 ENCOUNTER — Ambulatory Visit (INDEPENDENT_AMBULATORY_CARE_PROVIDER_SITE_OTHER): Admitting: Family Medicine

## 2023-09-04 ENCOUNTER — Encounter: Payer: Self-pay | Admitting: Family Medicine

## 2023-09-04 VITALS — BP 137/82 | HR 74 | Temp 98.4°F | Resp 18 | Ht 66.0 in | Wt 224.0 lb

## 2023-09-04 DIAGNOSIS — I10 Essential (primary) hypertension: Secondary | ICD-10-CM

## 2023-09-04 DIAGNOSIS — E559 Vitamin D deficiency, unspecified: Secondary | ICD-10-CM

## 2023-09-04 DIAGNOSIS — E538 Deficiency of other specified B group vitamins: Secondary | ICD-10-CM

## 2023-09-04 DIAGNOSIS — E782 Mixed hyperlipidemia: Secondary | ICD-10-CM | POA: Diagnosis not present

## 2023-09-04 DIAGNOSIS — T7840XD Allergy, unspecified, subsequent encounter: Secondary | ICD-10-CM

## 2023-09-04 DIAGNOSIS — R0982 Postnasal drip: Secondary | ICD-10-CM | POA: Insufficient documentation

## 2023-09-04 MED ORDER — CETIRIZINE HCL 10 MG PO TABS: 10.0000 mg | ORAL_TABLET | Freq: Every day | ORAL | 3 refills | Status: AC

## 2023-09-04 MED ORDER — EZETIMIBE 10 MG PO TABS
10.0000 mg | ORAL_TABLET | Freq: Every day | ORAL | 1 refills | Status: AC
Start: 2023-09-04 — End: ?

## 2023-09-04 NOTE — Assessment & Plan Note (Signed)
Checking B12 level.  °

## 2023-09-04 NOTE — Assessment & Plan Note (Signed)
 On losartan  HCTZ 100-25

## 2023-09-04 NOTE — Assessment & Plan Note (Signed)
 Has this at night after she lays down.  Try taking Zyrtec  at bedtime.

## 2023-09-04 NOTE — Assessment & Plan Note (Signed)
 Not certain what her vitamin D  dosage is.  Checking labs today

## 2023-09-04 NOTE — Assessment & Plan Note (Signed)
 06/07/2023 total cholesterol 182, Triggs 215, HDL 29 and LDL 115.  Goal for her is LDL less than 70.  She is on atorvastatin 80 and Zetia  10 mg

## 2023-09-04 NOTE — Progress Notes (Signed)
 Established Patient Office Visit  Subjective   Patient ID: Charlotte [name], female    DOB: Apr 07, 1956  Age: 68 y.o. 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  She is taking allopurinol  twice a day now and her gout is much better.   She has left hip pain and thigh pain and she has right sided sciatica.  She has osteoarthritis of the knees.   She sees an endocrinologist for her diabetes.  Last A1c 06/07/2023 was 8.3%.  06/07/2023 total cholesterol 182, Triggs 215, HDL 29 and LDL 115.  She takes atorvastatin 80 mg daily and Zetia  10 mg.   She has B12 and vitamin D  deficiencies.    ROS    Objective:     BP 137/82 (BP Location: Left Arm, Patient Position: Sitting, Cuff Size: Normal)   Pulse 74   Temp 98.4 F (36.9 C) (Oral)   Resp 18   Ht 5\' 6"  (1.676 m)   Wt 224 lb (101.6 kg)   SpO2 96%   BMI 36.15 kg/m    Physical Exam Vitals and nursing note reviewed.  Constitutional:      Appearance: Normal appearance.  HENT:     Head: Normocephalic and atraumatic.  Eyes:     Conjunctiva/sclera: Conjunctivae normal.  Cardiovascular:     Rate and Rhythm: Normal rate and regular rhythm.  Pulmonary:     Effort: Pulmonary effort is normal.     Breath sounds: Normal breath sounds.  Musculoskeletal:     Right lower leg: No edema.     Left lower leg: No edema.  Skin:    General: Skin is warm and dry.  Neurological:     Mental Status: She is alert and oriented to person, place, and time.  Psychiatric:        Mood and Affect: Mood normal.        Behavior: Behavior normal.        Thought  Content: Thought content normal.        Judgment: Judgment normal.          No results found for any visits on 09/04/23.    The 10-year ASCVD risk score (Arnett DK, et al., 2019) is: 25.3%    Assessment & Plan:  Mixed hyperlipidemia Assessment & Plan: 06/07/2023 total cholesterol 182, Triggs 215, HDL 29 and LDL 115.  Goal for her is LDL less than 70.  She is on atorvastatin 80 and Zetia  10 mg  Orders: -     Ezetimibe ; Take 1 tablet (10 mg total) by mouth daily.  Dispense: 90 tablet; Refill: 1 -     Comprehensive metabolic panel with GFR; Future -     Lipid panel; Future  B12 deficiency Assessment & Plan: Checking B12 level.    Orders: -     Vitamin B12  Vitamin D  deficiency Assessment & Plan: Not certain what her vitamin D  dosage is.  Checking labs today  Orders: -     VITAMIN D  25 Hydroxy (Vit-D Deficiency, Fractures)  Allergy, subsequent encounter -     Cetirizine  HCl; Take  1 tablet (10 mg total) by mouth daily.  Dispense: 90 tablet; Refill: 3  Essential (primary) hypertension Assessment & Plan: On losartan  HCTZ 100-25   Post-nasal drainage Assessment & Plan: Has this at night after she lays down.  Try taking Zyrtec  at bedtime.      Return in about 3 months (around 12/04/2023).    Nastasia Kage K Lyncoln Ledgerwood, MD

## 2023-09-05 ENCOUNTER — Encounter: Payer: Self-pay | Admitting: Family Medicine

## 2023-09-05 DIAGNOSIS — M542 Cervicalgia: Secondary | ICD-10-CM | POA: Diagnosis not present

## 2023-09-05 DIAGNOSIS — G894 Chronic pain syndrome: Secondary | ICD-10-CM | POA: Diagnosis not present

## 2023-09-05 DIAGNOSIS — Z5181 Encounter for therapeutic drug level monitoring: Secondary | ICD-10-CM | POA: Diagnosis not present

## 2023-09-05 DIAGNOSIS — Z79891 Long term (current) use of opiate analgesic: Secondary | ICD-10-CM | POA: Diagnosis not present

## 2023-09-05 LAB — VITAMIN D 25 HYDROXY (VIT D DEFICIENCY, FRACTURES): Vit D, 25-Hydroxy: 13.4 ng/mL — ABNORMAL LOW (ref 30.0–100.0)

## 2023-09-05 LAB — VITAMIN B12: Vitamin B-12: 176 pg/mL — ABNORMAL LOW (ref 232–1245)

## 2023-09-06 ENCOUNTER — Ambulatory Visit: Payer: Medicare Other | Admitting: Family Medicine

## 2023-09-07 ENCOUNTER — Other Ambulatory Visit: Payer: Self-pay | Admitting: Family Medicine

## 2023-09-07 DIAGNOSIS — I152 Hypertension secondary to endocrine disorders: Secondary | ICD-10-CM

## 2023-09-07 DIAGNOSIS — M7062 Trochanteric bursitis, left hip: Secondary | ICD-10-CM | POA: Diagnosis not present

## 2023-09-07 DIAGNOSIS — E782 Mixed hyperlipidemia: Secondary | ICD-10-CM

## 2023-09-07 MED ORDER — ATORVASTATIN CALCIUM 80 MG PO TABS: 80.0000 mg | ORAL_TABLET | Freq: Every day | ORAL | 1 refills | Status: AC

## 2023-09-07 MED ORDER — SPIRONOLACTONE 25 MG PO TABS
25.0000 mg | ORAL_TABLET | Freq: Every day | ORAL | 1 refills | Status: DC
Start: 2023-09-07 — End: 2023-10-09

## 2023-09-07 MED ORDER — CARVEDILOL 6.25 MG PO TABS: 6.2500 mg | ORAL_TABLET | Freq: Two times a day (BID) | ORAL | 1 refills | Status: DC

## 2023-09-07 MED ORDER — LOSARTAN POTASSIUM-HCTZ 100-25 MG PO TABS: 1.0000 | ORAL_TABLET | Freq: Every day | ORAL | 1 refills | Status: DC

## 2023-09-09 DIAGNOSIS — R0781 Pleurodynia: Secondary | ICD-10-CM | POA: Diagnosis not present

## 2023-09-11 DIAGNOSIS — M7062 Trochanteric bursitis, left hip: Secondary | ICD-10-CM | POA: Diagnosis not present

## 2023-09-12 DIAGNOSIS — M7062 Trochanteric bursitis, left hip: Secondary | ICD-10-CM | POA: Diagnosis not present

## 2023-09-14 ENCOUNTER — Encounter: Payer: Self-pay | Admitting: Family Medicine

## 2023-09-14 DIAGNOSIS — Z1231 Encounter for screening mammogram for malignant neoplasm of breast: Secondary | ICD-10-CM | POA: Diagnosis not present

## 2023-09-14 LAB — HM MAMMOGRAPHY

## 2023-09-26 DIAGNOSIS — D352 Benign neoplasm of pituitary gland: Secondary | ICD-10-CM | POA: Diagnosis not present

## 2023-09-26 DIAGNOSIS — E237 Disorder of pituitary gland, unspecified: Secondary | ICD-10-CM | POA: Diagnosis not present

## 2023-09-27 DIAGNOSIS — G894 Chronic pain syndrome: Secondary | ICD-10-CM | POA: Diagnosis not present

## 2023-09-27 DIAGNOSIS — M5459 Other low back pain: Secondary | ICD-10-CM | POA: Diagnosis not present

## 2023-09-27 DIAGNOSIS — Z5181 Encounter for therapeutic drug level monitoring: Secondary | ICD-10-CM | POA: Diagnosis not present

## 2023-09-27 DIAGNOSIS — Z79891 Long term (current) use of opiate analgesic: Secondary | ICD-10-CM | POA: Diagnosis not present

## 2023-09-27 DIAGNOSIS — M542 Cervicalgia: Secondary | ICD-10-CM | POA: Diagnosis not present

## 2023-10-09 ENCOUNTER — Ambulatory Visit (INDEPENDENT_AMBULATORY_CARE_PROVIDER_SITE_OTHER): Admitting: Family Medicine

## 2023-10-09 ENCOUNTER — Encounter: Payer: Self-pay | Admitting: Family Medicine

## 2023-10-09 ENCOUNTER — Ambulatory Visit: Payer: Self-pay

## 2023-10-09 VITALS — BP 131/84 | HR 74 | Temp 98.1°F | Resp 18 | Ht 66.0 in | Wt 221.0 lb

## 2023-10-09 DIAGNOSIS — E1159 Type 2 diabetes mellitus with other circulatory complications: Secondary | ICD-10-CM | POA: Diagnosis not present

## 2023-10-09 DIAGNOSIS — I152 Hypertension secondary to endocrine disorders: Secondary | ICD-10-CM

## 2023-10-09 DIAGNOSIS — I10 Essential (primary) hypertension: Secondary | ICD-10-CM | POA: Diagnosis not present

## 2023-10-09 DIAGNOSIS — R1011 Right upper quadrant pain: Secondary | ICD-10-CM | POA: Insufficient documentation

## 2023-10-09 DIAGNOSIS — R109 Unspecified abdominal pain: Secondary | ICD-10-CM | POA: Diagnosis not present

## 2023-10-09 LAB — POCT URINALYSIS DIP (CLINITEK)
Bilirubin, UA: NEGATIVE
Blood, UA: NEGATIVE
Glucose, UA: NEGATIVE mg/dL
Ketones, POC UA: NEGATIVE mg/dL
Leukocytes, UA: NEGATIVE
Nitrite, UA: NEGATIVE
POC PROTEIN,UA: 30 — AB
Spec Grav, UA: >=1.03 — AB (ref 1.010–1.025)
Urobilinogen, UA: 0.2 U/dL
pH, UA: 5.5 (ref 5.0–8.0)

## 2023-10-09 MED ORDER — LOSARTAN POTASSIUM-HCTZ 100-25 MG PO TABS: 1.0000 | ORAL_TABLET | Freq: Every day | ORAL | 1 refills | Status: DC

## 2023-10-09 MED ORDER — FAMOTIDINE 20 MG PO TABS: 20.0000 mg | ORAL_TABLET | Freq: Two times a day (BID) | ORAL | 3 refills | Status: DC

## 2023-10-09 NOTE — Telephone Encounter (Signed)
 Chief Complaint: Right side rib pain radiating to back Symptoms: pain Frequency: 1 month Pertinent Negatives: Patient denies difficulty breathing, diarrhea, vomiting, pain with urination Disposition: [] ED /[] Urgent Care (no appt availability in office) / [x] Appointment(In office/virtual)/ []  [facility]/ [] Home Care/ [] Refused Recommended Disposition /[] [facility]/ []  Follow-up with PCP Additional Notes: Patient called to make appt with PCP for further evaluation after experiencing right side rib pain that wraps around to her back for one month. Patient was seen in UC for this and given a full round of prednisone  that she has completed, but still having the constant pain. Patient states she no longer has her gallbladder. Patient denies any known injury. Patient denies difficulty breathing, pain with urination, diarrhea, vomiting.    Copied from CRM [id number]. Topic: Clinical - Red Word Triage >> Oct 09, 2023  9:01 AM Baldemar Lev wrote: Red Word that prompted transfer to Nurse Triage: Right side pain under rib cage, severe pain. Reason for Disposition  [1] MODERATE pain (e.g., interferes with normal activities) AND [2] pain comes and goes (cramps) AND [3] present > 24 hours  (Exception: Pain with Vomiting or Diarrhea - see that Guideline.)  Answer Assessment - Initial Assessment Questions 1. LOCATION: "Where does it hurt?"      Right side across ribs and into your back 2. RADIATION: "Does the pain shoot anywhere else?" (e.g., chest, back)     Into back 3. ONSET: "When did the pain begin?" (e.g., minutes, hours or days ago)      Last month 4. SUDDEN: "Gradual or sudden onset?"     Gradual over last month 5. PATTERN "Does the pain come and go, or is it constant?"    - If it comes and goes: "How long does it last?" "Do you have pain now?"     (Note: Comes and goes means the pain is intermittent. It goes away completely between bouts.)    - If constant: "Is it getting  better, staying the same, or getting worse?"      (Note: Constant means the pain never goes away completely; most serious pain is constant and gets worse.)      Comes and goes 6. SEVERITY: "How bad is the pain?"  (e.g., Scale 1-10; mild, moderate, or severe)    - MILD (1-3): Doesn't interfere with normal activities, abdomen soft and not tender to touch.     - MODERATE (4-7): Interferes with normal activities or awakens from sleep, abdomen tender to touch.     - SEVERE (8-10): Excruciating pain, doubled over, unable to do any normal activities.       7 7. RECURRENT SYMPTOM: "Have you ever had this type of stomach pain before?" If Yes, ask: "When was the last time?" and "What happened that time?"      No 8. CAUSE: "What do you think is causing the stomach pain?"     Unsure  9. RELIEVING/AGGRAVATING FACTORS: "What makes it better or worse?" (e.g., antacids, bending or twisting motion, bowel movement)     No relieving or aggravating factors 10. OTHER SYMPTOMS: "Do you have any other symptoms?" (e.g., back pain, diarrhea, fever, urination pain, vomiting)       Radiating around to back  Protocols used: Abdominal Pain - [facility]

## 2023-10-09 NOTE — Assessment & Plan Note (Signed)
 Blood pressure 131/84.  Goal 120s over 70s because she has type 2 diabetes and hypertension takes amlodipine  10 mg, carvedilol  6.25 mg twice daily and losartan  HCTZ 100-25 daily.  Will ask her to monitor her blood pressure at home.

## 2023-10-09 NOTE — Progress Notes (Signed)
 Established Patient Office Visit  Subjective   Patient ID: Charlotte [name], female    DOB: 09-Jun-1955  Age: 68 y.o. MRN: [id number]  Chief Complaint  Patient presents with   Abdominal Pain    RLQ. Radiates to lower back. Started 09/09/23.    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 10-year ASCVD risk is 23.1 and she is on Lipitor 80 and Zetia  10 mg daily.  06/07/2023 total cholesterol 182, Triggs 215, HDL 29 and LDL 115.      She complains of right upper quadrant and right flank pain  that has been present for at least a month..  She describes the pain as constant, a burst of pain that seems to roll around.  She denies fever, chills or night sweats.  She denies a cough or shortness of breath with ambulation.  There has been no trauma.  There is no rash.  She is s/p cholecystectomy.  Eating does not seem to affect the pain.  She takes sucralfate  and omeprazole  40 mg daily.  She had x-rays (09/09/2023) of her abdomen that were normal.      She complains of thick mucus and postnasal drainage every morning.  She has been taking Coricidin HBP.  She does not have postnasal drip during the day.     ROS    Objective:      BP 131/84 (BP Location: Left Arm, Patient Position: Sitting, Cuff Size: Normal)   Pulse 74   Temp 98.1 F (36.7 C) (Oral)   Resp 18   Ht 5\' 6"  (1.676 m)   Wt 221 lb (100.2 kg)   SpO2 95%   BMI 35.67 kg/m    Physical Exam       Results for orders placed or performed in visit on 10/09/23  POCT URINALYSIS DIP (CLINITEK)  Result Value Ref Range   Color, UA yellow yellow   Clarity, UA clear clear   Glucose, UA  negative negative mg/dL   Bilirubin, UA negative negative   Ketones, POC UA negative negative mg/dL   Spec Grav, UA >=6.962 (A) 1.010 - 1.025   Blood, UA negative negative   pH, UA 5.5 5.0 - 8.0   POC PROTEIN,UA =30 (A) negative, trace   Urobilinogen, UA 0.2 0.2 or 1.0 E.U./dL   Nitrite, UA Negative Negative   Leukocytes, UA Negative Negative      The 10-year ASCVD risk score (Arnett DK, et al., 2019) is: 23.1%    Assessment & Plan:  Flank pain Assessment & Plan: Urine sample was negative except for specific gravity of 1.030.  Wondering if the concentrated urine is irritating the kidney.  Ask her to push fluids.  Orders: -     POCT URINALYSIS DIP (CLINITEK)  Hypertension associated with diabetes (HCC) -     Losartan  Potassium-HCTZ; Take 1 tablet by mouth daily.  Dispense: 90 tablet; Refill: 1  Right upper quadrant pain Assessment & Plan: Has had pain for about a month now.  Takes sucralfate  and omeprazole  40 mg daily.  s/p cholecystectomy.  X-rays were normal, no kidney stone.  No fever or night sweats.  Trial of Pepcid  20 mg twice daily in addition  to her sucralfate  and omeprazole  40 mg.  Follow-up in a month  Orders: -     Famotidine; Take 1 tablet (20 mg total) by mouth 2 (two) times daily.  Dispense: 60 tablet; Refill: 3     Return in about 4 weeks (around 11/06/2023).    Alleah Dearman K Mayme Profeta, MD

## 2023-10-09 NOTE — Assessment & Plan Note (Signed)
 Has had pain for about a month now.  Takes sucralfate  and omeprazole  40 mg daily.  s/p cholecystectomy.  X-rays were normal, no kidney stone.  No fever or night sweats.  Trial of Pepcid 20 mg twice daily in addition to her sucralfate  and omeprazole  40 mg.  Follow-up in a month

## 2023-10-09 NOTE — Assessment & Plan Note (Signed)
 Urine sample was negative except for specific gravity of 1.030.  Wondering if the concentrated urine is irritating the kidney.  Ask her to push fluids.

## 2023-10-18 DIAGNOSIS — Z794 Long term (current) use of insulin: Secondary | ICD-10-CM | POA: Diagnosis not present

## 2023-10-18 DIAGNOSIS — D352 Benign neoplasm of pituitary gland: Secondary | ICD-10-CM | POA: Diagnosis not present

## 2023-10-18 DIAGNOSIS — E119 Type 2 diabetes mellitus without complications: Secondary | ICD-10-CM | POA: Diagnosis not present

## 2023-10-18 DIAGNOSIS — Z79899 Other long term (current) drug therapy: Secondary | ICD-10-CM | POA: Diagnosis not present

## 2023-10-18 DIAGNOSIS — I10 Essential (primary) hypertension: Secondary | ICD-10-CM | POA: Diagnosis not present

## 2023-10-18 DIAGNOSIS — E1165 Type 2 diabetes mellitus with hyperglycemia: Secondary | ICD-10-CM | POA: Diagnosis not present

## 2023-11-01 DIAGNOSIS — M5459 Other low back pain: Secondary | ICD-10-CM | POA: Diagnosis not present

## 2023-11-01 DIAGNOSIS — Z79891 Long term (current) use of opiate analgesic: Secondary | ICD-10-CM | POA: Diagnosis not present

## 2023-11-01 DIAGNOSIS — Z5181 Encounter for therapeutic drug level monitoring: Secondary | ICD-10-CM | POA: Diagnosis not present

## 2023-11-01 DIAGNOSIS — G894 Chronic pain syndrome: Secondary | ICD-10-CM | POA: Diagnosis not present

## 2023-11-01 DIAGNOSIS — M25552 Pain in left hip: Secondary | ICD-10-CM | POA: Diagnosis not present

## 2023-11-29 DIAGNOSIS — M542 Cervicalgia: Secondary | ICD-10-CM | POA: Diagnosis not present

## 2023-11-29 DIAGNOSIS — Z5181 Encounter for therapeutic drug level monitoring: Secondary | ICD-10-CM | POA: Diagnosis not present

## 2023-11-29 DIAGNOSIS — Z79891 Long term (current) use of opiate analgesic: Secondary | ICD-10-CM | POA: Diagnosis not present

## 2023-11-29 DIAGNOSIS — M5459 Other low back pain: Secondary | ICD-10-CM | POA: Diagnosis not present

## 2023-11-29 DIAGNOSIS — G894 Chronic pain syndrome: Secondary | ICD-10-CM | POA: Diagnosis not present

## 2023-12-04 ENCOUNTER — Telehealth: Payer: Self-pay

## 2023-12-04 NOTE — Telephone Encounter (Signed)
 Copied from CRM #[id number]. Topic: Clinical - Lab/Test Results >> Dec 04, 2023  2:56 PM Charlotte [name] wrote: Reason for CRM: Hospital called the patient and told her Feb 29,2024 is the last time the patient had a bone density scan and insurance companies don't pay for this until after 2 years,   Patient would like to ask if Dr Ziglar would think maybe refer her to the [facility] office could be helpful, the patient wants to make sure its not a blood clot concern and she states she is experiencing burning tingling pains sometimes when she wakes up and she is hoping it's not blood clot this is the right leg   Pt num [phone] (M)

## 2023-12-18 NOTE — Telephone Encounter (Signed)
 Patient says she is not having swelling but muscle spasms. Spasms start from buttock down on a daily basis.

## 2023-12-18 NOTE — Telephone Encounter (Signed)
 Appointment scheduled 12/19/23 at 8:10 am

## 2023-12-19 ENCOUNTER — Encounter: Payer: Self-pay | Admitting: Family Medicine

## 2023-12-19 ENCOUNTER — Ambulatory Visit (INDEPENDENT_AMBULATORY_CARE_PROVIDER_SITE_OTHER): Admitting: Family Medicine

## 2023-12-19 VITALS — BP 117/81 | HR 71 | Temp 98.2°F | Resp 18 | Ht 66.0 in | Wt 218.0 lb

## 2023-12-19 DIAGNOSIS — R011 Cardiac murmur, unspecified: Secondary | ICD-10-CM | POA: Diagnosis not present

## 2023-12-19 DIAGNOSIS — E538 Deficiency of other specified B group vitamins: Secondary | ICD-10-CM

## 2023-12-19 DIAGNOSIS — M542 Cervicalgia: Secondary | ICD-10-CM | POA: Insufficient documentation

## 2023-12-19 HISTORY — DX: Cardiac murmur, unspecified: R01.1

## 2023-12-19 MED ORDER — METHOCARBAMOL 500 MG PO TABS: ORAL_TABLET | ORAL | 1 refills | Status: DC

## 2023-12-19 NOTE — Assessment & Plan Note (Signed)
 Will consider getting an ECHO to evaluate.

## 2023-12-19 NOTE — Assessment & Plan Note (Signed)
 Has spasm in the muscles in her neck on the left side that may be exacerbated by her rotator cuff syndrome.  Will try methocarbamol  500 to 1000 mg 3 times daily as needed neck pain

## 2023-12-19 NOTE — Assessment & Plan Note (Signed)
 Ask her to get B12 1000 mg and take it daily.  Will check her B12 level in a couple of months to see if she is able to sustain a B12 level with oral supplementation.

## 2023-12-19 NOTE — Progress Notes (Signed)
 Established Patient Office Visit  Subjective   Patient ID: Charlotte [name], female    DOB: [birth year]  Age: 68 y.o. 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 10-year ASCVD risk is 23% and she is on Lipitor 80 and Zetia  10 mg daily.  Discussed the use of AI scribe software for clinical note transcription with the patient, who gave verbal consent to proceed.  History of Present Illness   Charlotte [name] is a 68 year old female who presents with neck pain and leg cramps.  She experiences neck pain originating at the base of her neck, radiating upwards, with a painful 'little bump' in the area, especially when turning her head to the left. She has a history of a torn rotator cuff on the left side, which has worsened over time, causing pain when reaching overhead or behind her. She was scheduled for surgical repair but postponed due to Covid.    She reports cramps in the back of her legs, which sometimes make her feel as though her legs might give out when walking. She uses Voltaren gel for relief, which provides temporary easing of the symptoms. She has bad varicose veins, particularly in her right leg, which sometimes swells.  Her varicose veins are painful.    She has experienced significant weight loss, dropping from 250 pounds to approximately 218-219 pounds, attributed to increased water intake and efforts to reduce medication dependency. She is not on a GLP-1 inhibitor and is taking insulin .    She has a vitamin D  deficiency and a B12 level of 176,  which is low. She recalls receiving a B12 shot years ago but is not currently taking any B12 supplements. She experiences memory issues, such as forgetting where she placed items or what she intended to do, which she associates with her low B12 levels.  She underwent lumbar spinal surgery in the past and recalls being told she had degenerative disc disease and spinal stenosis. She experiences back pain and tingling in her feet, which she associates with her back issues. She uses gabapentin  for the tingling but does not take it regularly.  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 Her last MRI indicated stability.      Review of Systems  Musculoskeletal:  Positive for neck pain.      Objective:     BP 117/81   Pulse 71   Temp 98.2 F (36.8 C) (Oral)   Resp 18   Ht 5' 6 (1.676 m)   Wt 218 lb (98.9 kg)   SpO2 96%   BMI 35.19 kg/m    Physical Exam Vitals and nursing note reviewed.  Constitutional:      Appearance: Normal appearance.  HENT:     Head: Normocephalic and atraumatic.  Eyes:     Conjunctiva/sclera: Conjunctivae normal.  Cardiovascular:     Rate and Rhythm: Normal rate and regular rhythm.     Heart sounds: Murmur (SEM  LUSB) heard.  Pulmonary:     Effort: Pulmonary effort is normal.     Breath sounds: Normal breath sounds.  Musculoskeletal:     Right lower leg: No edema.     Left lower leg: No edema.  Skin:    General: Skin is warm and dry.  Neurological:     Mental Status: She is alert and oriented to person, place, and time.  Psychiatric:        Mood and Affect: Mood normal.        Behavior: Behavior normal.        Thought Content: Thought content normal.        Judgment: Judgment normal.          No results found for any visits on 12/19/23.    The 10-year ASCVD risk score (Arnett DK, et al., 2019) is: 18.7%    Assessment & Plan:  Neck pain Assessment &  Plan: Has spasm in the muscles in her neck on the left side that may be exacerbated by her rotator cuff syndrome.  Will try methocarbamol  500 to 1000 mg 3 times daily as needed neck pain  Orders: -     Methocarbamol ; 500mg  to 1000mg  TID Prn muscle spasms  Dispense: 90 tablet; Refill: 1  Cardiac murmur, unspecified Assessment & Plan: Will consider getting an ECHO to evaluate.   B12 deficiency Assessment & Plan: Ask her to get B12 1000 mg and take it daily.  Will check her B12 level in a couple of months to see if she is able to sustain a B12 level with oral supplementation.    Assessment and Plan    Cervical myalgia Neck pain with a palpable muscle spasm.. Pain radiates upwards and worsens with left head movement.   Chronic left shoulder pain due to rotator cuff tear Chronic left shoulder pain from a known rotator cuff tear. Worsening pain with overhead or behind reaching. Surgery postponed due to COVID-19.  Chronic bilateral lower extremity pain and cramps due to lumbar spine disease and varicose veins Chronic bilateral lower extremity pain and cramps, possibly due to lumbar spine disease and varicose veins. Cramps in the back of the legs, occasional leg weakness, prominent varicose veins, especially in the right leg, with swelling. Spinal surgery and degenerative disc disease may contribute to symptoms. - Refer to vascular specialist for evaluation of varicose veins.  Vitamin B12 deficiency Vitamin B12 deficiency with a level of 176. Not on B12 supplements, reports memory issues possibly related to deficiency. - Recommend B12 supplementation with 1000 mcg daily.  Vitamin D  deficiency Vitamin D  deficiency identified. Previously on vitamin D  but not currently supplementing. - Recommend Vitamin D3 supplementation with 5000 IU daily.      Return in about 3 months (around 03/20/2024).    Hollyanne Schloesser K Allisyn Kunz, MD

## 2023-12-21 DIAGNOSIS — R1013 Epigastric pain: Secondary | ICD-10-CM | POA: Diagnosis not present

## 2023-12-21 DIAGNOSIS — K921 Melena: Secondary | ICD-10-CM | POA: Diagnosis not present

## 2023-12-21 DIAGNOSIS — R112 Nausea with vomiting, unspecified: Secondary | ICD-10-CM | POA: Diagnosis not present

## 2023-12-24 ENCOUNTER — Ambulatory Visit: Payer: Self-pay

## 2023-12-24 NOTE — Telephone Encounter (Signed)
 FYI Only or Action Required?: Action required by provider: update on patient condition and requesting medication for cough.  Patient was last seen in primary care on 12/19/2023 by Ziglar, Susan K, MD.  Called Nurse Triage reporting Cough and nasal drainage.  Symptoms began yesterday.  Interventions attempted: OTC medications: cough drops, coricidin, drinking warm fluids, Vicks vapor/mist machine.  Symptoms are: constant, hacking productive cough with thick yellow or white mucus (spells of coughing causing gagging, vomited mucus), nasal drainage.Cough improves after taking coricidin but states when it wears off the cough is unchanged and hard, hacking:.  Triage Disposition: See Physician Within 24 Hours  Patient/caregiver understands and will follow disposition?: No, wishes to speak with PCP              Copied from CRM #[id number]. Topic: Clinical - Medication Question >> Dec 24, 2023 12:03 PM Montie POUR wrote: Reason for CRM:  Tajana has a bad cough and wanted to see if Dr. Ziglar will call her in cough medication. She is just coughing and nose is dripping. Please call her at [phone]. She just saw Dr. Ziglar on 12/19/23 Reason for Disposition  SEVERE coughing spells (e.g., whooping sound after coughing, vomiting after coughing)  Answer Assessment - Initial Assessment Questions 1. ONSET: When did the cough begin?      Yesterday.  2. SEVERITY: How bad is the cough today?     Hard, hacking cough. Around 2am coughing spell that caused gagging. She states this morning she had to throw up because the cough was constant, states she threw up some mucus. It is a frequent cough, has improved slightly/slowed down since Coricidin.  3. SPUTUM: Describe the color of your sputum (e.g., none, dry cough; clear, white, yellow, green)     Sometimes thick, yellow or white.  4. HEMOPTYSIS: Are you coughing up any blood? If Yes, ask: How much? (e.g., flecks, streaks, tablespoons,  etc.)     No.  5. DIFFICULTY BREATHING: Are you having difficulty breathing? If Yes, ask: How bad is it? (e.g., mild, moderate, severe)      She states her breathing is okay. No breathing difficulty.  6. FEVER: Do you have a fever? If Yes, ask: What is your temperature, how was it measured, and when did it start?     She states she felt warm and clammy. She states she has not checked with a thermometer.  7. CARDIAC HISTORY: Do you have any history of heart disease? (e.g., heart attack, congestive heart failure)      Per chart: hypertension, cardiac murmur.  8. LUNG HISTORY: Do you have any history of lung disease?  (e.g., pulmonary embolus, asthma, emphysema)     No.  9. PE RISK FACTORS: Do you have a history of blood clots? (or: recent major surgery, recent prolonged travel, bedridden)     She states she thinks she has a lung nodule.  10. OTHER SYMPTOMS: Do you have any other symptoms? (e.g., runny nose, wheezing, chest pain)       Nasal drainage. Denies wheezing, chest pain, difficulty breathing.   11. PREGNANCY: Is there any chance you are pregnant? When was your last menstrual period?       N/A.  12. TRAVEL: Have you traveled out of the country in the last month? (e.g., travel history, exposures)       No.  Protocols used: Cough - Acute Productive-A-AH

## 2023-12-25 DIAGNOSIS — R112 Nausea with vomiting, unspecified: Secondary | ICD-10-CM | POA: Diagnosis not present

## 2023-12-25 DIAGNOSIS — R1013 Epigastric pain: Secondary | ICD-10-CM | POA: Diagnosis not present

## 2023-12-26 DIAGNOSIS — U071 COVID-19: Secondary | ICD-10-CM | POA: Diagnosis not present

## 2023-12-26 DIAGNOSIS — R051 Acute cough: Secondary | ICD-10-CM | POA: Diagnosis not present

## 2023-12-26 DIAGNOSIS — M5459 Other low back pain: Secondary | ICD-10-CM | POA: Diagnosis not present

## 2023-12-26 DIAGNOSIS — Z79891 Long term (current) use of opiate analgesic: Secondary | ICD-10-CM | POA: Diagnosis not present

## 2023-12-26 DIAGNOSIS — R059 Cough, unspecified: Secondary | ICD-10-CM | POA: Diagnosis not present

## 2023-12-26 DIAGNOSIS — Z5181 Encounter for therapeutic drug level monitoring: Secondary | ICD-10-CM | POA: Diagnosis not present

## 2023-12-26 DIAGNOSIS — G894 Chronic pain syndrome: Secondary | ICD-10-CM | POA: Diagnosis not present

## 2023-12-31 ENCOUNTER — Ambulatory Visit: Payer: Self-pay

## 2023-12-31 NOTE — Telephone Encounter (Signed)
 FYI Only or Action Required?: FYI only for provider.  Patient was last seen in primary care on 12/19/2023 by Ziglar, Susan K, MD.  Called Nurse Triage reporting Cough.  Symptoms began a week ago.  Interventions attempted: Prescription medications: Promethazine DM and azithromycin.  Symptoms are: stable.  Triage Disposition: See PCP When Office is Open (Within 3 Days)  Patient/caregiver understands and will follow disposition?: Yes    Copied from CRM #[id number]. Topic: Clinical - Medication Question >> Dec 31, 2023  9:37 AM Henretta I wrote: Reason for CRM: Patient wants to know if more of the azithromycin 200mg  can be prescirbed as she feels like it was not enough to treat the covid or if there is another medication that can be prescribed. Reason for Disposition  Cough has been present for > 3 weeks  Answer Assessment - Initial Assessment Questions Patient says she diagnosed with COVID when she went to [facility] on 12/28/23. She says she was prescribed Promethazine DM for cough and azithromycin. She says her cough is getting better, but still there. She says the congestion is in the chest and she gagged this morning to get up phlegm, white-yellow colored. Advised OV with PCP. She says she needs a morning appointment because that is when she has transportation. Advised Friday 8/22 is the first available morning, she agreed. Advised masking guidelines per CDC is to wear a mask 5 days out of the home as long as no fever, wear a mask until Thursday 8/21. She verbalized understanding.   1. ONSET: When did the cough begin?      12/23/23  2. SEVERITY:  How bad is the cough today?      3/10  3. SPUTUM: Describe the color of your sputum (e.g., none, dry cough; clear, white, yellow, green)     Nothing is come up, but can feel the congestion, whitish-yellow with a tent of yellow this morning  4. HEMOPTYSIS: Are you coughing up any blood? If Yes, ask: How much? (e.g., flecks,  streaks, tablespoons, etc.)     No  5. DIFFICULTY BREATHING: Are you having difficulty breathing? If Yes, ask: How bad is it? (e.g., mild, moderate, severe)      No  6. FEVER: Do you have a fever? If Yes, ask: What is your temperature, how was it measured, and when did it start?     No  7. CARDIAC HISTORY: Do you have any history of heart disease? (e.g., heart attack, congestive heart failure)      No  8. LUNG HISTORY: Do you have any history of lung disease?  (e.g., pulmonary embolus, asthma, emphysema)     No  9. PE RISK FACTORS: Do you have a history of blood clots? (or: recent major surgery, recent prolonged travel, bedridden)     No  10. OTHER SYMPTOMS: Do you have any other symptoms? (e.g., runny nose, wheezing, chest pain)       No   12. TRAVEL: Have you traveled out of the country in the last month? (e.g., travel history, exposures)       COVID exposure  Protocols used: Cough - Acute Productive-A-AH

## 2024-01-03 DIAGNOSIS — M5459 Other low back pain: Secondary | ICD-10-CM | POA: Diagnosis not present

## 2024-01-03 DIAGNOSIS — Z5181 Encounter for therapeutic drug level monitoring: Secondary | ICD-10-CM | POA: Diagnosis not present

## 2024-01-03 DIAGNOSIS — G894 Chronic pain syndrome: Secondary | ICD-10-CM | POA: Diagnosis not present

## 2024-01-03 DIAGNOSIS — Z79891 Long term (current) use of opiate analgesic: Secondary | ICD-10-CM | POA: Diagnosis not present

## 2024-01-03 DIAGNOSIS — M542 Cervicalgia: Secondary | ICD-10-CM | POA: Diagnosis not present

## 2024-01-04 ENCOUNTER — Encounter: Payer: Self-pay | Admitting: Family Medicine

## 2024-01-04 ENCOUNTER — Ambulatory Visit (INDEPENDENT_AMBULATORY_CARE_PROVIDER_SITE_OTHER): Admitting: Family Medicine

## 2024-01-04 VITALS — BP 137/88 | HR 70 | Temp 98.5°F | Resp 18 | Ht 66.0 in | Wt 213.0 lb

## 2024-01-04 DIAGNOSIS — H1031 Unspecified acute conjunctivitis, right eye: Secondary | ICD-10-CM | POA: Diagnosis not present

## 2024-01-04 DIAGNOSIS — R053 Chronic cough: Secondary | ICD-10-CM | POA: Diagnosis not present

## 2024-01-04 DIAGNOSIS — U099 Post covid-19 condition, unspecified: Secondary | ICD-10-CM | POA: Diagnosis not present

## 2024-01-04 DIAGNOSIS — U071 COVID-19: Secondary | ICD-10-CM

## 2024-01-04 MED ORDER — HYDROCODONE BIT-HOMATROP MBR 5-1.5 MG/5ML PO SOLN: 5.0000 mL | Freq: Three times a day (TID) | ORAL | 0 refills | Status: DC | PRN

## 2024-01-04 MED ORDER — CIPROFLOXACIN HCL 0.3 % OP SOLN: 1.0000 [drp] | OPHTHALMIC | 0 refills | Status: AC

## 2024-01-04 NOTE — Progress Notes (Addendum)
 Established Patient Office Visit  Subjective   Patient ID: Charlotte [name], female    DOB: 1955/08/07  Age: 68 y.o. 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 10-year ASCVD risk is 23% and she is on Lipitor 80 and Zetia  10 mg daily.   Discussed the use of AI scribe software for clinical note transcription with the patient, who gave verbal consent to proceed.  History of Present Illness   Charlotte [name] is a 68 year old female who presents with persistent symptoms following a recent COVID-19 infection.  She contracted COVID-19 around August 8-10, 2025, during a family reunion and was confirmed positive on December 26, 2023, at [facility]. Initial symptoms included severe coughing, leading to gagging and a sensation of impending vomiting. She was treated with Paxlovid, prednisone , azithromycin, and a cough syrup, which initially improved her symptoms.  Despite initial improvement, she experienced a recurrence of symptoms, including night sweats, eye irritation with tearing and pain, and a persistent cough. The cough is productive with thick, white, and clear sputum. She has completed her course of prednisone  and azithromycin and continues to use a cough syrup every six hours, taking 5 milliliters per dose. She also uses Flonase  for nasal symptoms.  She has experienced diarrhea, which has since resolved. No history of wheezing problems, no COPD and no shortness of breath in the past. She has a history of smoking, having quit 13 years ago after starting at age 29. No known  allergies except for a reaction to sulfur drugs and ACE inhibitors, which cause a rash.  This is her second COVID-19 infection, the first occurring when the virus initially emerged. A recent chest x-ray was performed.       ROS    Objective:     BP 137/88   Pulse 70   Temp 98.5 F (36.9 C) (Oral)   Resp 18   Ht 5' 6 (1.676 m)   Wt 213 lb (96.6 kg)   SpO2 97%   BMI 34.38 kg/m    Physical Exam Vitals and nursing note reviewed.  Constitutional:      Appearance: Normal appearance.  HENT:     Head: Normocephalic and atraumatic.  Eyes:     General: Lids are normal.        Right eye: Discharge (clear discharge) present.     Conjunctiva/sclera:     Right eye: Right conjunctiva is injected.  Cardiovascular:     Rate and Rhythm: Normal rate and regular rhythm.     Pulses: Normal pulses.     Heart sounds: Murmur (2/6 sem LUSB) heard.  Pulmonary:     Effort: Pulmonary effort is normal.     Breath sounds: Rhonchi present.  Musculoskeletal:     Right lower leg: No edema.     Left lower leg: No edema.  Skin:    General: Skin is warm and dry.  Neurological:     Mental Status: She is alert and oriented to person, place, and time.  Psychiatric:        Mood and Affect: Mood normal.  Behavior: Behavior normal.        Thought Content: Thought content normal.        Judgment: Judgment normal.          No results found for any visits on 01/04/24.    The 10-year ASCVD risk score (Arnett DK, et al., 2019) is: 25.5%    Assessment & Plan:  Acute conjunctivitis of right eye, unspecified acute conjunctivitis type -     Ciprofloxacin  HCl; Place 1 drop into the right eye every 2 (two) hours for 3 days. Administer 1 drop, every 2 hours, while awake, for 2 days. Then 1 drop, every 4 hours, while awake, for the next 5 days.  Dispense: 2.5 mL; Refill: 0  COVID -     HYDROcodone  Bit-Homatrop MBr; Take 5 mLs by mouth every 8 (eight) hours as needed for cough.  Dispense: 120  mL; Refill: 0  Post-COVID chronic cough Assessment & Plan: Coughing up clear to white sputum.  Changing her cough syrup to Hycodan.  Please stop using the cough syrup. FU in ten days for recheck.      Return in about 10 days (around 01/14/2024).    Natia Fahmy K Kendelle Schweers, MD

## 2024-01-04 NOTE — Assessment & Plan Note (Signed)
 Coughing up clear to white sputum.  Changing her cough syrup to Hycodan.  Please stop using the cough syrup. FU in ten days for recheck.

## 2024-01-07 ENCOUNTER — Ambulatory Visit: Payer: Self-pay

## 2024-01-07 NOTE — Telephone Encounter (Signed)
 FYI Only or Action Required?: FYI only for provider.  Patient was last seen in primary care on 01/04/2024 by Ziglar, Susan K, MD.  Called Nurse Triage reporting Shoulder Pain.  Symptoms began several weeks ago.  Interventions attempted: Prescription medications: muscle relaxer and Ice/heat application.  Symptoms are: gradually worsening.  Triage Disposition: See PCP When Office is Open (Within 3 Days)  Patient/caregiver understands and will follow disposition?: Yes Fyi: Patient also reports that treatment for conjuntivitis is not working. She is still waking up with mucus closing her eyes in the morning along with pain and irritation.   Copied from CRM #[id number]. Topic: Clinical - Red Word Triage >> Jan 07, 2024  9:17 AM Henretta I wrote: Red Word that prompted transfer to Nurse Triage: Patient is having pain from shoulder to the back of her head and patients eye is also red, painful, and itchy was given drops for it but they are not working. Patient states when she wakes up in the morning her eye is stuck shut by mucus. Reason for Disposition  [1] MODERATE pain (e.g., interferes with normal activities) AND [2] present > 3 days  Answer Assessment - Initial Assessment Questions 1. ONSET: When did the pain start?     3 weeks  2. LOCATION: Where is the pain located?     From Left shoulder up to side of head  3. PAIN: How bad is the pain? (Scale 1-10; or mild, moderate, severe)     Moderate  4. WORK OR EXERCISE: Has there been any recent work or exercise that involved this part of the body?     No  5. CAUSE: What do you think is causing the shoulder pain?     Unsure of cause, has history of torn rotator cuff  6. OTHER SYMPTOMS: Do you have any other symptoms? (e.g., neck pain, swelling, rash, fever, numbness, weakness)     Mild weakness  7. PREGNANCY: Is there any chance you are pregnant? When was your last menstrual period?     No  Protocols used: Shoulder  Pain-A-AH

## 2024-01-08 ENCOUNTER — Ambulatory Visit (INDEPENDENT_AMBULATORY_CARE_PROVIDER_SITE_OTHER): Admitting: Family Medicine

## 2024-01-08 ENCOUNTER — Telehealth: Payer: Self-pay | Admitting: Family Medicine

## 2024-01-08 ENCOUNTER — Encounter: Payer: Self-pay | Admitting: Family Medicine

## 2024-01-08 VITALS — BP 112/70 | HR 75 | Temp 98.3°F | Resp 18 | Ht 66.0 in | Wt 215.0 lb

## 2024-01-08 DIAGNOSIS — B0052 Herpesviral keratitis: Secondary | ICD-10-CM | POA: Diagnosis not present

## 2024-01-08 DIAGNOSIS — H1012 Acute atopic conjunctivitis, left eye: Secondary | ICD-10-CM

## 2024-01-08 DIAGNOSIS — H2513 Age-related nuclear cataract, bilateral: Secondary | ICD-10-CM | POA: Diagnosis not present

## 2024-01-08 MED ORDER — PRED-G 0.3-1 % OP SUSP: 1.0000 [drp] | Freq: Three times a day (TID) | OPHTHALMIC | 0 refills | Status: DC

## 2024-01-08 NOTE — Telephone Encounter (Signed)
 Copied from CRM [id number]. Topic: Clinical - Prescription Issue >> Jan 08, 2024 12:37 PM Cleave MATSU wrote: Reason for CRM: [facility] stated that the eyedrop medication that Dr. Ziglar sent in they cannot get it because its dicontinued can she send in something else.

## 2024-01-08 NOTE — Progress Notes (Unsigned)
 Established Patient Office Visit  Subjective   Patient ID: Charlotte [name], female    DOB: [birth year]  Age: 68 y.o. 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 10-year ASCVD risk is 23% and she is on Lipitor 80 and Zetia  10 mg daily.       Discussed the use of AI scribe software for clinical note transcription with the patient, who gave verbal consent to proceed.  History of Present Illness   Charlotte [name] is a 68 year old female who presents with eye irritation and blurry vision.  She has been experiencing eye irritation and blurry vision for the past three days. The sensation is described as a 'nagging feeling like a grit' in her eye, with intermittent watering. Initially, the watering had stopped but resumed after three days, though it is less severe. She uses tissues at night to manage the watering and prevent it from soaking her pillow.  No continuous drainage but notes intermittent watering. She confirms difficulty sleeping due to the eye symptoms  She has a history of a persistent cough, which has improved. She notes tenderness on the back of her head on the left side. She manages with alternating hot and cold packs. She previously took azithromycin for COVID.  ROS    Objective:     BP 112/70   Pulse 75   Temp 98.3 F (36.8 C) (Oral)   Resp 18   Ht 5' 6 (1.676 m)   Wt 215 lb (97.5 kg)   SpO2 97%   BMI 34.70 kg/m    Physical Exam Vitals reviewed.  Constitutional:      Appearance: Normal appearance.  HENT:     Head: Normocephalic.   Eyes:     General: Lids are normal. Vision grossly intact. Gaze aligned appropriately.        Right eye: No discharge.        Left eye: No discharge.     Extraocular Movements: Extraocular movements intact.     Conjunctiva/sclera:     Left eye: Left conjunctiva is injected.  Cardiovascular:     Rate and Rhythm: Normal rate.  Pulmonary:     Effort: Pulmonary effort is normal.  Neurological:     Mental Status: She is alert and oriented to person, place, and time.  Psychiatric:        Mood and Affect: Mood normal.        Behavior: Behavior normal.        Thought Content: Thought content normal.        Judgment: Judgment normal.          No results found for any visits on 01/08/24.    The 10-year ASCVD risk score (Arnett DK, et al., 2019) is: 17.1%    Assessment & Plan:  Acute atopic conjunctivitis of left eye -     Ambulatory referral to Ophthalmology -     Pred-G ; Place 1 drop into the right eye 3 (three) times daily.  Dispense: 5 mL; Refill: 0     Return if symptoms worsen  or fail to improve.    Tabbetha Kutscher K Neviah Braud, MD

## 2024-01-08 NOTE — Telephone Encounter (Unsigned)
 Copied from CRM [id number]. Topic: Clinical - Prescription Issue >> Jan 08, 2024 12:37 PM Cleave MATSU wrote: Reason for CRM: [facility] stated that the eyedrop medication that Dr. Ziglar sent in they cannot get it because its dicontinued can she send in something else.

## 2024-01-09 ENCOUNTER — Other Ambulatory Visit: Payer: Self-pay | Admitting: Family Medicine

## 2024-01-09 DIAGNOSIS — H1031 Unspecified acute conjunctivitis, right eye: Secondary | ICD-10-CM

## 2024-01-09 MED ORDER — NEOMYCIN-POLYMYXIN-HC 3.5-10000-1 OP SUSP: 3.0000 [drp] | Freq: Four times a day (QID) | OPHTHALMIC | 0 refills | Status: DC

## 2024-01-09 NOTE — Telephone Encounter (Signed)
 Copied from CRM [id number]. Topic: Clinical - Prescription Issue >> Jan 08, 2024 12:37 PM Cleave MATSU wrote: Reason for CRM: [facility] stated that the eyedrop medication that Dr. Ziglar sent in they cannot get it because its dicontinued can she send in something else. >> Jan 09, 2024  3:33 PM Zebedee SAUNDERS wrote: Received call from [facility] per [place] Phone: [phone] Fax: [phone]  insurance will not pay for neomycin -polymyxin-hydrocortisone (CORTISPORIN) 3.5-10000-1 ophthalmic suspension, please send new script for another medication.  >> Jan 08, 2024  3:28 PM Montie POUR wrote: Levie is calling in to see if Dr. Ziglar could call in another medication. gentamicin-prednisoLONE (PRED-G ) 0.3-1 % ophthalmic drops [[id number]] is no longer offered at [facility]. Please let Megen know what to do. Her number is [phone]. Thanks

## 2024-01-10 ENCOUNTER — Other Ambulatory Visit: Payer: Self-pay | Admitting: Family Medicine

## 2024-01-10 DIAGNOSIS — H1031 Unspecified acute conjunctivitis, right eye: Secondary | ICD-10-CM

## 2024-01-10 MED ORDER — NEOMYCIN-POLYMYXIN-DEXAMETH 3.5-10000-0.1 OP SUSP: 1.0000 [drp] | Freq: Four times a day (QID) | OPHTHALMIC | 0 refills | Status: DC

## 2024-01-11 DIAGNOSIS — R590 Localized enlarged lymph nodes: Secondary | ICD-10-CM | POA: Diagnosis not present

## 2024-01-11 DIAGNOSIS — R21 Rash and other nonspecific skin eruption: Secondary | ICD-10-CM | POA: Diagnosis not present

## 2024-01-11 DIAGNOSIS — R051 Acute cough: Secondary | ICD-10-CM | POA: Diagnosis not present

## 2024-01-11 DIAGNOSIS — M542 Cervicalgia: Secondary | ICD-10-CM | POA: Diagnosis not present

## 2024-01-16 ENCOUNTER — Encounter: Payer: Self-pay | Admitting: Family Medicine

## 2024-01-16 ENCOUNTER — Ambulatory Visit: Admitting: Family Medicine

## 2024-01-17 DIAGNOSIS — B0052 Herpesviral keratitis: Secondary | ICD-10-CM | POA: Diagnosis not present

## 2024-01-17 DIAGNOSIS — H2513 Age-related nuclear cataract, bilateral: Secondary | ICD-10-CM | POA: Diagnosis not present

## 2024-01-17 DIAGNOSIS — H179 Unspecified corneal scar and opacity: Secondary | ICD-10-CM | POA: Diagnosis not present

## 2024-01-20 ENCOUNTER — Encounter: Payer: Self-pay | Admitting: Family Medicine

## 2024-01-20 ENCOUNTER — Emergency Department
Admission: EM | Admit: 2024-01-20 | Discharge: 2024-01-20 | Disposition: A | Discharge status: Home / Self Care | Attending: Emergency Medicine | Admitting: Emergency Medicine

## 2024-01-20 ENCOUNTER — Other Ambulatory Visit: Payer: Self-pay

## 2024-01-20 ENCOUNTER — Emergency Department

## 2024-01-20 DIAGNOSIS — E119 Type 2 diabetes mellitus without complications: Secondary | ICD-10-CM | POA: Diagnosis not present

## 2024-01-20 DIAGNOSIS — I10 Essential (primary) hypertension: Secondary | ICD-10-CM | POA: Diagnosis not present

## 2024-01-20 DIAGNOSIS — Z981 Arthrodesis status: Secondary | ICD-10-CM | POA: Diagnosis not present

## 2024-01-20 DIAGNOSIS — M5412 Radiculopathy, cervical region: Secondary | ICD-10-CM | POA: Insufficient documentation

## 2024-01-20 DIAGNOSIS — M542 Cervicalgia: Secondary | ICD-10-CM | POA: Diagnosis not present

## 2024-01-20 LAB — CBC
HCT: 36.9 % (ref 36.0–46.0)
Hemoglobin: 11.8 g/dL — ABNORMAL LOW (ref 12.0–15.0)
MCH: 27.5 pg (ref 26.0–34.0)
MCHC: 32 g/dL (ref 30.0–36.0)
MCV: 86 fL (ref 80.0–100.0)
Platelets: 329 K/uL (ref 150–400)
RBC: 4.29 MIL/uL (ref 3.87–5.11)
RDW: 13.8 % (ref 11.5–15.5)
WBC: 7.7 K/uL (ref 4.0–10.5)
nRBC: 0 % (ref 0.0–0.2)

## 2024-01-20 LAB — COMPREHENSIVE METABOLIC PANEL WITH GFR
ALT: 8 U/L (ref 0–44)
AST: 16 U/L (ref 15–41)
Albumin: 3.5 g/dL (ref 3.5–5.0)
Alkaline Phosphatase: 60 U/L (ref 38–126)
Anion gap: 13 (ref 5–15)
BUN: 16 mg/dL (ref 8–23)
CO2: 26 mmol/L (ref 22–32)
Calcium: 9.4 mg/dL (ref 8.9–10.3)
Chloride: 101 mmol/L (ref 98–111)
Creatinine, Ser: 1.12 mg/dL — ABNORMAL HIGH (ref 0.44–1.00)
GFR, Estimated: 54 mL/min — ABNORMAL LOW (ref >60)
Glucose, Bld: 148 mg/dL — ABNORMAL HIGH (ref 70–99)
Potassium: 3.7 mmol/L (ref 3.5–5.1)
Sodium: 140 mmol/L (ref 135–145)
Total Bilirubin: 0.4 mg/dL (ref 0.0–1.2)
Total Protein: 6.8 g/dL (ref 6.5–8.1)

## 2024-01-20 MED ORDER — FENTANYL CITRATE PF 50 MCG/ML IJ SOSY
50.0000 ug | PREFILLED_SYRINGE | Freq: Once | INTRAMUSCULAR | Status: AC
  Administered 2024-01-20: 50 ug via INTRAVENOUS
  Filled 2024-01-20: qty 1

## 2024-01-20 MED ORDER — IOHEXOL 300 MG/ML  SOLN
75.0000 mL | Freq: Once | INTRAMUSCULAR | Status: AC | PRN
  Administered 2024-01-20: 75 mL via INTRAVENOUS

## 2024-01-20 NOTE — Discharge Instructions (Addendum)
 Please increase your gabapentin  to 200 mg 3 times a day

## 2024-01-20 NOTE — ED Triage Notes (Signed)
 Pt to ED via POV from home. Pt reports recently dx with COVID on 8/13 and went back for a check up on the 29th and dx with shingles on left side of neck, face and head. Pt placed on medication. Pt reports burning, pain and numbness has gotten worse. Pt also reports went to bed last pm and woke up this morning with lower right back pain with radiation to right lower leg. Pt reports difficulty ambulating this morning due to pain. Pt also reports numbness and tingling to the right leg.

## 2024-01-20 NOTE — ED Provider Notes (Signed)
 [facility] Provider Note    Event Date/Time   First MD Initiated Contact with Patient 01/20/24 0901     (approximate)   History   Left-sided neck pain   HPI  Charlotte [name] is a 68 y.o. female with a history of neck and low back surgery, diabetes, hypertension presents with complaints of left-sided neck pain just posterior to the angle of the jaw with radiation down her neck, she reports the pain is severe.  She has been tried on prednisone  but which raised her glucose so she was switched to gabapentin  which she notes provides little relief.  Initially there was suspicion of shingles but she has not developed a rash     Physical Exam   Triage Vital Signs: ED Triage Vitals [01/20/24 0858]  Encounter Vitals Group     BP 120/86     Girls Systolic BP Percentile      Girls Diastolic BP Percentile      Boys Systolic BP Percentile      Boys Diastolic BP Percentile      Pulse Rate 82     Resp 18     Temp 98 F (36.7 C)     Temp Source Oral     SpO2 98 %     Weight      Height      Head Circumference      Peak Flow      Pain Score 10     Pain Loc      Pain Education      Exclude from Growth Chart     Most recent vital signs: Vitals:   01/20/24 0924 01/20/24 1211  BP:  (!) 145/86  Pulse:  76  Resp:  18  Temp:  98.2 F (36.8 C)  SpO2: 99% 99%     General: Awake, no distress.  CV:  Good peripheral perfusion.  Resp:  Normal effort.  Abd:  No distention.  Other:  No vertebral tenderness palpation, no rash in the affected area, questionable swelling just behind the jaw on the left   ED Results / Procedures / Treatments   Labs (all labs ordered are listed, but only abnormal results are displayed) Labs Reviewed  CBC - Abnormal; Notable for the following components:      Result Value   Hemoglobin 11.8 (*)    All other components within normal limits  COMPREHENSIVE METABOLIC PANEL WITH GFR - Abnormal; Notable for the following  components:   Glucose, Bld 148 (*)    Creatinine, Ser 1.12 (*)    GFR, Estimated 54 (*)    All other components within normal limits     EKG     RADIOLOGY     PROCEDURES:  Critical Care performed:   Procedures   MEDICATIONS ORDERED IN ED: Medications  fentaNYL  (SUBLIMAZE ) injection 50 mcg (50 mcg Intravenous Given 01/20/24 0923)  iohexol  (OMNIPAQUE ) 300 MG/ML solution 75 mL (75 mLs Intravenous Contrast Given 01/20/24 1032)     IMPRESSION / MDM / ASSESSMENT AND PLAN / ED COURSE  I reviewed the triage vital signs and the nursing notes. Patient's presentation is most consistent with acute illness / injury with system symptoms.  Patient presents with pain as described above, she is clearly uncomfortable, she has had x-rays of her cervical spine which were unremarkable, will send for CT soft tissue neck to rule out infection or other soft tissue abnormality given over a week of significant pain which appears to  be worsening  CT scan is reassuring, no acute abnormality lab work not significantly changed, continue to suspect radiculopathy, will increase her gabapentin , close outpatient follow-up strongly urged      FINAL CLINICAL IMPRESSION(S) / ED DIAGNOSES   Final diagnoses:  Cervical radiculopathy     Rx / DC Orders   ED Discharge Orders     None        Note:  This document was prepared using Dragon voice recognition software and may include unintentional dictation errors.   Arlander Charleston, MD 01/20/24 [phone]

## 2024-01-21 DIAGNOSIS — M791 Myalgia, unspecified site: Secondary | ICD-10-CM | POA: Diagnosis not present

## 2024-01-21 DIAGNOSIS — B029 Zoster without complications: Secondary | ICD-10-CM | POA: Diagnosis not present

## 2024-01-22 DIAGNOSIS — G8929 Other chronic pain: Secondary | ICD-10-CM | POA: Diagnosis not present

## 2024-01-22 DIAGNOSIS — M79604 Pain in right leg: Secondary | ICD-10-CM | POA: Diagnosis not present

## 2024-01-22 DIAGNOSIS — M5417 Radiculopathy, lumbosacral region: Secondary | ICD-10-CM | POA: Diagnosis not present

## 2024-01-22 DIAGNOSIS — R200 Anesthesia of skin: Secondary | ICD-10-CM | POA: Diagnosis not present

## 2024-01-22 DIAGNOSIS — M5431 Sciatica, right side: Secondary | ICD-10-CM | POA: Diagnosis not present

## 2024-01-22 DIAGNOSIS — Z87891 Personal history of nicotine dependence: Secondary | ICD-10-CM | POA: Diagnosis not present

## 2024-01-22 DIAGNOSIS — I119 Hypertensive heart disease without heart failure: Secondary | ICD-10-CM | POA: Diagnosis not present

## 2024-01-22 DIAGNOSIS — M4727 Other spondylosis with radiculopathy, lumbosacral region: Secondary | ICD-10-CM | POA: Diagnosis not present

## 2024-01-22 DIAGNOSIS — M47816 Spondylosis without myelopathy or radiculopathy, lumbar region: Secondary | ICD-10-CM | POA: Diagnosis not present

## 2024-01-24 ENCOUNTER — Telehealth: Payer: Self-pay | Admitting: Family Medicine

## 2024-01-24 ENCOUNTER — Ambulatory Visit: Payer: Self-pay

## 2024-01-24 NOTE — Telephone Encounter (Signed)
 Copied from CRM [id number]. Topic: Referral - Question >> Jan 24, 2024  9:15 AM Avram MATSU wrote: Reason for CRM: Pt was seen in the ED and was told the provider needed to set her up with a referral for MRI. She was not able to text MD Ziglar back though mychart about making an appt.

## 2024-01-24 NOTE — Telephone Encounter (Signed)
 She went to [facility] and got gabapenint.  She has been taking 300mg  TID without benefit.  So then she went to the ER at [facility]. She got a toradol, tylenol  and another pill.  She also got an IV.  She goers to pain management already and has had ESI in the past.that was helpful.  Please call your pain management people.

## 2024-01-24 NOTE — Telephone Encounter (Signed)
 FYI Only or Action Required?: Action required by provider: referral request. (Spinal doctor referral for an MRI)  Patient was last seen in primary care on 01/08/2024 by Ziglar, Susan K, MD.  Called Nurse Triage reporting Back Pain.  Symptoms began several days ago.  Interventions attempted: Prescription medications: gabapentin , hydrocodone , ibuprofen.  Symptoms are: severe lower back pain and pain radiating down right leg, right leg numbness and weakness with numbness in top of right foot rapidly worsening.  Triage Disposition: Go to ED Now (overriding See HCP Within 4 Hours (Or PCP Triage))  Patient/caregiver understands and will follow disposition?: No                     Copied from CRM #[id number]. Topic: Clinical - Red Word Triage >> Jan 24, 2024  9:18 AM Avram MATSU wrote: Red Word that prompted transfer to Nurse Triage: patient is in pain/discomfort. Already been seen in the er but the pain came back. Feet is also numb. Reason for Disposition  [1] SEVERE back pain (e.g., excruciating, unable to do any normal activities) AND [2] not improved 2 hours after pain medicine  Answer Assessment - Initial Assessment Questions Patient tearful during triage, states pain is over a 10/10. Patient was given IV dilaudid  and Toradol in ED and states the pain feels that severe again and like she needs those medications again. Advised patient that IV medications can't be given in office and if her back pain feels as severe as when she went to ED that she should be reevaluated in the ED. She states her husband has already gone to work and she doesn't have anyone to take her and she doesn't want to go back tot he ED. She is requesting a referral for a spinal doctor for an MRI today. Called [facility] and notified staff of ED refusal.   1. ONSET: When did the pain begin? (e.g., minutes, hours, days)     Chronic issues, flared up since Tuesday.  2. LOCATION: Where does it hurt? (upper,  mid or lower back)     Lower.  3. SEVERITY: How bad is the pain?  (e.g., Scale 1-10; mild, moderate, or severe)     Over 10 /10. Patient tearful during triage.  4. PATTERN: Is the pain constant? (e.g., yes, no; constant, intermittent)      Yes.  5. RADIATION: Does the pain shoot into your legs or somewhere else?     Right hip/buttocks/thigh/leg.  6. CAUSE:  What do you think is causing the back pain?      States she had severe spinal stenosis and DDD and has been having a flare up of pain since Tuesday. She states she has chronic issues.  7. BACK OVERUSE:  Any recent lifting of heavy objects, strenuous work or exercise?     No.  8. MEDICINES: What have you taken so far for the pain? (e.g., nothing, acetaminophen , NSAIDS)     Gabapentin  twice daily, hydrocodone , ibuprofen.  9. NEUROLOGIC SYMPTOMS: Do you have any weakness, numbness, or problems with bowel/bladder control?     Numbness in right leg and top of right foot. Right leg weakness, she states she can't put it down to walk on it and her son and husband had to help her to the car to get to the ED yesterday. Denies problems with bowel or bladder control.  10. OTHER SYMPTOMS: Do you have any other symptoms? (e.g., fever, abdomen pain, burning with urination, blood in urine)  Numbness down right leg and foot.  11. PREGNANCY: Is there any chance you are pregnant? When was your last menstrual period?       N/A.  Protocols used: Back Pain-A-AH

## 2024-01-28 ENCOUNTER — Other Ambulatory Visit: Payer: Self-pay | Admitting: Family Medicine

## 2024-01-28 DIAGNOSIS — R069 Unspecified abnormalities of breathing: Secondary | ICD-10-CM | POA: Diagnosis not present

## 2024-01-28 DIAGNOSIS — M51372 Other intervertebral disc degeneration, lumbosacral region with discogenic back pain and lower extremity pain: Secondary | ICD-10-CM

## 2024-01-28 DIAGNOSIS — Z79891 Long term (current) use of opiate analgesic: Secondary | ICD-10-CM | POA: Diagnosis not present

## 2024-01-28 DIAGNOSIS — Z882 Allergy status to sulfonamides status: Secondary | ICD-10-CM | POA: Diagnosis not present

## 2024-01-28 DIAGNOSIS — E119 Type 2 diabetes mellitus without complications: Secondary | ICD-10-CM | POA: Diagnosis not present

## 2024-01-28 DIAGNOSIS — M79604 Pain in right leg: Secondary | ICD-10-CM | POA: Diagnosis not present

## 2024-01-28 DIAGNOSIS — R200 Anesthesia of skin: Secondary | ICD-10-CM | POA: Diagnosis not present

## 2024-01-28 DIAGNOSIS — Z888 Allergy status to other drugs, medicaments and biological substances status: Secondary | ICD-10-CM | POA: Diagnosis not present

## 2024-01-28 DIAGNOSIS — Z79899 Other long term (current) drug therapy: Secondary | ICD-10-CM | POA: Diagnosis not present

## 2024-01-28 DIAGNOSIS — M5441 Lumbago with sciatica, right side: Secondary | ICD-10-CM | POA: Diagnosis not present

## 2024-01-28 DIAGNOSIS — M109 Gout, unspecified: Secondary | ICD-10-CM | POA: Diagnosis not present

## 2024-01-28 DIAGNOSIS — M545 Low back pain, unspecified: Secondary | ICD-10-CM | POA: Diagnosis not present

## 2024-01-29 ENCOUNTER — Telehealth: Payer: Self-pay

## 2024-01-29 ENCOUNTER — Other Ambulatory Visit: Payer: Self-pay | Admitting: Family Medicine

## 2024-01-29 DIAGNOSIS — F40240 Claustrophobia: Secondary | ICD-10-CM

## 2024-01-29 MED ORDER — DIAZEPAM 5 MG PO TABS: 5.0000 mg | ORAL_TABLET | Freq: Two times a day (BID) | ORAL | 0 refills | Status: AC | PRN

## 2024-01-29 NOTE — Telephone Encounter (Signed)
 Left detailed VM message notifying patient of prescription and when to take medication. Requested a return call for any further questions or concerns.

## 2024-01-29 NOTE — Telephone Encounter (Signed)
(  Key: [id number])  PA Case ID #: [id number]  Rx #: [id number]   Status Sent to Plan today  Drug diazePAM  5MG  tablets  Form [facility] Medicare Electronic Request Form Original Claim Info [id number]

## 2024-01-29 NOTE — Telephone Encounter (Signed)
 Copied from CRM [id number]. Topic: Clinical - Medication Question >> Jan 29, 2024 11:28 AM Rea ORN wrote: Reason for CRM: Pt has MRI tomorrow and is request a one time rx for a sedative to help with claustrophobia during procedure.  Please send to [facility] [address] [place] ([place]) [place] [address]

## 2024-01-29 NOTE — Telephone Encounter (Signed)
 Copied from CRM [id number]. Topic: Clinical - Prescription Issue >> Jan 29, 2024  2:55 PM Rosaria BRAVO wrote: Reason for CRM:   [phone] Gustav called from [facility], reporting that she is faxing over a document with the diazepam  denial information.

## 2024-01-30 ENCOUNTER — Ambulatory Visit: Admission: RE | Admit: 2024-01-30 | Source: Ambulatory Visit

## 2024-01-30 ENCOUNTER — Telehealth: Payer: Self-pay

## 2024-01-30 ENCOUNTER — Telehealth: Payer: Self-pay | Admitting: Family Medicine

## 2024-01-30 ENCOUNTER — Ambulatory Visit: Admitting: Family Medicine

## 2024-01-30 ENCOUNTER — Other Ambulatory Visit: Payer: Self-pay | Admitting: Family Medicine

## 2024-01-30 DIAGNOSIS — M51372 Other intervertebral disc degeneration, lumbosacral region with discogenic back pain and lower extremity pain: Secondary | ICD-10-CM

## 2024-01-30 NOTE — Telephone Encounter (Signed)
 Per insurance, patient needs documented proof that she has tried and failed at least 6 weeks of physical therapy before MRI can be approved. Patient and provider has been informed.

## 2024-01-30 NOTE — Telephone Encounter (Signed)
 [phone] [facility] Called peer to peer and was denied until proof of 6 week Physical Therapy.  Advised provider and patient and she agreed to go to [facility] for PT.  Alerted imaging to cancel appt and provider to order PT with [facility].

## 2024-01-30 NOTE — Progress Notes (Signed)
   Established Patient Office Visit  Subjective   Patient ID: Charlotte [name], female    DOB: 1955/07/22  Age: 68 y.o. MRN: [id number]  No chief complaint on file.                ROS    Objective:     There were no vitals taken for this visit.   Physical Exam       No results found for any visits on 01/30/24.    The 10-year ASCVD risk score (Arnett DK, et al., 2019) is: 27.7%    Assessment & Plan:  There are no diagnoses linked to this encounter.   No follow-ups on file.    Miles Borkowski K Lanett Lasorsa, MD

## 2024-01-30 NOTE — Telephone Encounter (Signed)
 Copied from CRM [id number]. Topic: Clinical - Prescription Issue >> Jan 29, 2024  2:55 PM Rosaria BRAVO wrote: Reason for CRM:   [phone] Gustav called from [facility], reporting that she is faxing over a document with the diazepam  denial information. >> Jan 30, 2024  8:31 AM Emylou G wrote: Pls call patient.. need to checkin on pill she can use that is covered for her MRI?  Pls review her MRI appt is today.. Contact patient.. number on file is

## 2024-01-30 NOTE — Telephone Encounter (Signed)
 Copied from CRM #[id number]. Topic: General - Other >> Jan 30, 2024 10:26 AM Tonda B wrote: Reason for CRM: [facility] is calling in saying that the pt needs mri and [facility] needs prior authorization please call [phone] [facility]lon needs asap says that pt has an appointment scheduled but can not be seen until this prior please call pt to follow up authorization is done [id number]

## 2024-01-31 DIAGNOSIS — M5459 Other low back pain: Secondary | ICD-10-CM | POA: Diagnosis not present

## 2024-01-31 DIAGNOSIS — Z79891 Long term (current) use of opiate analgesic: Secondary | ICD-10-CM | POA: Diagnosis not present

## 2024-01-31 DIAGNOSIS — G894 Chronic pain syndrome: Secondary | ICD-10-CM | POA: Diagnosis not present

## 2024-01-31 DIAGNOSIS — Z5181 Encounter for therapeutic drug level monitoring: Secondary | ICD-10-CM | POA: Diagnosis not present

## 2024-01-31 DIAGNOSIS — M542 Cervicalgia: Secondary | ICD-10-CM | POA: Diagnosis not present

## 2024-02-05 ENCOUNTER — Encounter: Payer: Self-pay | Admitting: Family Medicine

## 2024-02-05 ENCOUNTER — Ambulatory Visit (INDEPENDENT_AMBULATORY_CARE_PROVIDER_SITE_OTHER): Admitting: Family Medicine

## 2024-02-05 VITALS — BP 136/76 | HR 72 | Temp 98.3°F | Resp 18 | Ht 66.0 in | Wt 224.0 lb

## 2024-02-05 DIAGNOSIS — E1159 Type 2 diabetes mellitus with other circulatory complications: Secondary | ICD-10-CM

## 2024-02-05 DIAGNOSIS — E1165 Type 2 diabetes mellitus with hyperglycemia: Secondary | ICD-10-CM

## 2024-02-05 DIAGNOSIS — I10 Essential (primary) hypertension: Secondary | ICD-10-CM | POA: Diagnosis not present

## 2024-02-05 DIAGNOSIS — Z794 Long term (current) use of insulin: Secondary | ICD-10-CM

## 2024-02-05 DIAGNOSIS — I152 Hypertension secondary to endocrine disorders: Secondary | ICD-10-CM | POA: Diagnosis not present

## 2024-02-05 DIAGNOSIS — M51372 Other intervertebral disc degeneration, lumbosacral region with discogenic back pain and lower extremity pain: Secondary | ICD-10-CM

## 2024-02-05 LAB — POCT GLYCOSYLATED HEMOGLOBIN (HGB A1C): Hemoglobin A1C: 8.2 % — AB (ref 4.0–5.6)

## 2024-02-05 MED ORDER — CARVEDILOL 6.25 MG PO TABS: 6.2500 mg | ORAL_TABLET | Freq: Two times a day (BID) | ORAL | 1 refills | Status: DC

## 2024-02-05 MED ORDER — GABAPENTIN 300 MG PO CAPS
300.0000 mg | ORAL_CAPSULE | Freq: Four times a day (QID) | ORAL | 3 refills | Status: AC
Start: 2024-02-05 — End: ?

## 2024-02-05 MED ORDER — METFORMIN HCL ER 500 MG PO TB24: 1000.0000 mg | ORAL_TABLET | Freq: Two times a day (BID) | ORAL | 3 refills | Status: AC

## 2024-02-05 MED ORDER — PRECISION QID TEST VI STRP: ORAL_STRIP | 3 refills | Status: AC

## 2024-02-05 NOTE — Assessment & Plan Note (Addendum)
 A1c is 8.2% today. Will increase her metformin  XR to 1000mg  am and 500mg  PM for a month then increase to metformin  XR 500mg  two twice a day with food.  Ask her to take an 81 mg aspirin  daily.

## 2024-02-05 NOTE — Assessment & Plan Note (Signed)
 Encouraging her to go to physical therapy, she would like to go to [facility].  Will increase gabapentin  to 300 mg 4 times daily from 3 times daily.

## 2024-02-05 NOTE — Assessment & Plan Note (Signed)
 Adequate control of her BP on carvedilol  6.25 twice daily, spironolactone  25 mg daily, losartan /HCTZ 100-25 daily and amlodipine  10 mg a day

## 2024-02-05 NOTE — Progress Notes (Signed)
 Established Patient Office Visit  Subjective   Patient ID: [name], female    DOB: [birth year]  Age: 68 y.o. MRN: [id number]  Chief Complaint  Patient presents with   Medical Management of Chronic Issues    HPI   Discussed the use of AI scribe software for clinical note transcription with the patient, who gave verbal consent to proceed.  History of Present Illness   Charlotte [name] is a 68 year old female with degenerative disc disease and post-herpetic neuralgia who presents with persistent back and leg pain.  She experiences persistent burning pain in her right hip and back, which radiates down her right leg, accompanied by numbness and a sensation of crawling under her skin. She has been taking gabapentin  300 mg TID, but it is not effective. Previous steroid injections in her spine provided relief for only a week.  Chiropractic adjustments did not provide lasting relief.  She has a history of diabetes, currently managed with metformin  500 mg twice daily and insulin , Baslagar 60 units in the morning and 40 units at night. Her A1c is 8.2%. She takes multiple medications for hypertension, including carvedilol , spironolactone , losartan  with HCTZ, and amlodipine .  She has experienced post-herpetic neuralgia following a shingles outbreak, despite having received both shingles vaccines. The pain is on the back of her scalp left side and into left side of neck anteriorly.  This pain prevents her from wearing a shirt comfortably.  The pain is persistent and severe, lasting for four weeks, and is currently managed with gabapentin  300mg  TID.  She does go to pain management and gets hydrocodone , 112 a month.  .         ROS    Objective:     BP 136/76 (BP Location: Left Arm, Patient Position: Sitting, Cuff Size: Normal)   Pulse 72   Temp 98.3 F (36.8 C) (Oral)   Resp 18   Ht 5' 6 (1.676 m)   Wt 224 lb (101.6 kg)   SpO2 96%   BMI 36.15 kg/m    Physical Exam Vitals and  nursing note reviewed.  Constitutional:      Appearance: Normal appearance.  HENT:     Head: Normocephalic and atraumatic.  Eyes:     Conjunctiva/sclera: Conjunctivae normal.  Cardiovascular:     Rate and Rhythm: Normal rate and regular rhythm.  Pulmonary:     Effort: Pulmonary effort is normal.     Breath sounds: Normal breath sounds.  Musculoskeletal:     Right lower leg: No edema.     Left lower leg: No edema.  Skin:    General: Skin is warm and dry.  Neurological:     Mental Status: She is alert and oriented to person, place, and time.  Psychiatric:        Mood and Affect: Mood normal.        Behavior: Behavior normal.        Thought Content: Thought content normal.        Judgment: Judgment normal.          Results for orders placed or performed in visit on 02/05/24  POCT glycosylated hemoglobin (Hb A1C)  Result Value Ref Range   Hemoglobin A1C 8.2 (A) 4.0 - 5.6 %   HbA1c POC (<> result, manual entry)     HbA1c, POC (prediabetic range)     HbA1c, POC (controlled diabetic range)        The 10-year ASCVD risk score (Arnett DK, et  al., 2019) is: 25.1%    Assessment & Plan:  Type 2 diabetes mellitus with hyperglycemia, with long-term current use of insulin  (HCC) Assessment & Plan: A1c is 8.2% today. Will increase her metformin  XR to 1000mg  am and 500mg  PM for a month then increase to metformin  XR 500mg  two twice a day with food.  Ask her to take an 81 mg aspirin  daily.  Orders: -     POCT glycosylated hemoglobin (Hb A1C) -     Precision QID Test; Use as instructed  Dispense: 300 each; Refill: 3 -     metFORMIN  HCl ER; Take 2 tablets (1,000 mg total) by mouth 2 (two) times daily with a meal.  Dispense: 120 tablet; Refill: 3  Hypertension associated with diabetes (HCC) -     Carvedilol ; Take 1 tablet (6.25 mg total) by mouth 2 (two) times daily with a meal.  Dispense: 180 tablet; Refill: 1  Degeneration of intervertebral disc of lumbosacral region with  discogenic back pain and lower extremity pain Assessment & Plan: Encouraging her to go to physical therapy, she would like to go to [facility].  Will increase gabapentin  to 300 mg 4 times daily from 3 times daily.  Orders: -     Gabapentin ; Take 1 capsule (300 mg total) by mouth 4 (four) times daily.  Dispense: 120 capsule; Refill: 3  Essential (primary) hypertension Assessment & Plan: Adequate control of her BP on carvedilol  6.25 twice daily, spironolactone  25 mg daily, losartan /HCTZ 100-25 daily and amlodipine  10 mg a day      Return in about 3 months (around 05/06/2024).    Charlotte Demont K Chala Gul, MD

## 2024-02-06 ENCOUNTER — Ambulatory Visit

## 2024-02-07 ENCOUNTER — Telehealth: Payer: Self-pay

## 2024-02-07 DIAGNOSIS — E1165 Type 2 diabetes mellitus with hyperglycemia: Secondary | ICD-10-CM

## 2024-02-07 MED ORDER — TRUE METRIX BLOOD GLUCOSE TEST VI STRP: 1.0000 | ORAL_STRIP | Freq: Three times a day (TID) | 12 refills | Status: AC

## 2024-02-07 NOTE — Telephone Encounter (Signed)
 Copied from CRM #[id number]. Topic: Clinical - Medical Advice >> Feb 07, 2024 11:07 AM [name] wrote: Reason for CRM: Patient called for the True Matrix for the strips  [facility] [address] ([address]), [place] - [address]) [place] [address] Phone: [phone] Fax: [phone]  Please call patient if needed at [phone]

## 2024-02-07 NOTE — Telephone Encounter (Signed)
 True Metrix test strips sent to [facility].

## 2024-02-08 DIAGNOSIS — M51372 Other intervertebral disc degeneration, lumbosacral region with discogenic back pain and lower extremity pain: Secondary | ICD-10-CM | POA: Diagnosis not present

## 2024-02-11 DIAGNOSIS — M51372 Other intervertebral disc degeneration, lumbosacral region with discogenic back pain and lower extremity pain: Secondary | ICD-10-CM | POA: Diagnosis not present

## 2024-02-13 DIAGNOSIS — M25561 Pain in right knee: Secondary | ICD-10-CM | POA: Diagnosis not present

## 2024-02-13 DIAGNOSIS — W19XXXA Unspecified fall, initial encounter: Secondary | ICD-10-CM | POA: Diagnosis not present

## 2024-02-13 DIAGNOSIS — M25571 Pain in right ankle and joints of right foot: Secondary | ICD-10-CM | POA: Diagnosis not present

## 2024-02-13 DIAGNOSIS — M79604 Pain in right leg: Secondary | ICD-10-CM | POA: Diagnosis not present

## 2024-02-20 DIAGNOSIS — M51372 Other intervertebral disc degeneration, lumbosacral region with discogenic back pain and lower extremity pain: Secondary | ICD-10-CM | POA: Diagnosis not present

## 2024-02-21 ENCOUNTER — Other Ambulatory Visit: Payer: Self-pay | Admitting: Family Medicine

## 2024-02-21 MED ORDER — FREESTYLE LIBRE 2 SENSOR MISC: 1.0000 | 1 refills | Status: AC

## 2024-02-21 MED ORDER — FREESTYLE LIBRE 2 READER DEVI: 1.0000 | 3 refills | Status: AC

## 2024-02-21 NOTE — Telephone Encounter (Unsigned)
 Copied from CRM [id number]. Topic: Clinical - Medication Refill >> Feb 21, 2024  9:36 AM Zebedee SAUNDERS wrote: Medication: Continuous Glucose Receiver (FREESTYLE LIBRE 2 READER) DEVI Continuous Glucose Sensor (FREESTYLE LIBRE 2 SENSOR) MISC, lancets   Has the patient contacted their pharmacy? Yes (Agent: If no, request that the patient contact the pharmacy for the refill. If patient does not wish to contact the pharmacy document the reason why and proceed with request.) (Agent: If yes, when and what did the pharmacy advise?)Pharmacy need script  This is the patient's preferred pharmacy:  [facility] [address] ([address]), [place] - [address] [place]) [place] [address] Phone: [phone] Fax: [phone]  Is this the correct pharmacy for this prescription? Yes If no, delete pharmacy and type the correct one.   Has the prescription been filled recently? Yes  Is the patient out of the medication? Yes  Has the patient been seen for an appointment in the last year OR does the patient have an upcoming appointment? Yes  Can we respond through MyChart? Yes  Agent: Please be advised that Rx refills may take up to 3 business days. We ask that you follow-up with your pharmacy.

## 2024-02-25 ENCOUNTER — Other Ambulatory Visit: Payer: Self-pay | Admitting: Family Medicine

## 2024-02-25 DIAGNOSIS — E782 Mixed hyperlipidemia: Secondary | ICD-10-CM

## 2024-02-27 DIAGNOSIS — M51372 Other intervertebral disc degeneration, lumbosacral region with discogenic back pain and lower extremity pain: Secondary | ICD-10-CM | POA: Diagnosis not present

## 2024-02-28 DIAGNOSIS — Z79891 Long term (current) use of opiate analgesic: Secondary | ICD-10-CM | POA: Diagnosis not present

## 2024-02-28 DIAGNOSIS — M5431 Sciatica, right side: Secondary | ICD-10-CM | POA: Diagnosis not present

## 2024-02-28 DIAGNOSIS — Z5181 Encounter for therapeutic drug level monitoring: Secondary | ICD-10-CM | POA: Diagnosis not present

## 2024-02-28 DIAGNOSIS — G894 Chronic pain syndrome: Secondary | ICD-10-CM | POA: Diagnosis not present

## 2024-02-28 DIAGNOSIS — M5459 Other low back pain: Secondary | ICD-10-CM | POA: Diagnosis not present

## 2024-02-28 DIAGNOSIS — M5136 Other intervertebral disc degeneration, lumbar region with discogenic back pain only: Secondary | ICD-10-CM | POA: Diagnosis not present

## 2024-02-29 ENCOUNTER — Encounter: Payer: Self-pay | Admitting: Family Medicine

## 2024-02-29 DIAGNOSIS — E782 Mixed hyperlipidemia: Secondary | ICD-10-CM

## 2024-02-29 MED ORDER — EZETIMIBE 10 MG PO TABS: 10.0000 mg | ORAL_TABLET | Freq: Every day | ORAL | 1 refills | Status: AC

## 2024-03-05 DIAGNOSIS — M51372 Other intervertebral disc degeneration, lumbosacral region with discogenic back pain and lower extremity pain: Secondary | ICD-10-CM | POA: Diagnosis not present

## 2024-03-12 ENCOUNTER — Ambulatory Visit (INDEPENDENT_AMBULATORY_CARE_PROVIDER_SITE_OTHER)

## 2024-03-12 DIAGNOSIS — Z23 Encounter for immunization: Secondary | ICD-10-CM | POA: Diagnosis not present

## 2024-03-12 DIAGNOSIS — M51372 Other intervertebral disc degeneration, lumbosacral region with discogenic back pain and lower extremity pain: Secondary | ICD-10-CM | POA: Diagnosis not present

## 2024-03-19 DIAGNOSIS — M51372 Other intervertebral disc degeneration, lumbosacral region with discogenic back pain and lower extremity pain: Secondary | ICD-10-CM | POA: Diagnosis not present

## 2024-03-27 DIAGNOSIS — M51372 Other intervertebral disc degeneration, lumbosacral region with discogenic back pain and lower extremity pain: Secondary | ICD-10-CM | POA: Diagnosis not present

## 2024-04-03 DIAGNOSIS — G894 Chronic pain syndrome: Secondary | ICD-10-CM | POA: Diagnosis not present

## 2024-04-03 DIAGNOSIS — M51372 Other intervertebral disc degeneration, lumbosacral region with discogenic back pain and lower extremity pain: Secondary | ICD-10-CM | POA: Diagnosis not present

## 2024-04-03 DIAGNOSIS — M5459 Other low back pain: Secondary | ICD-10-CM | POA: Diagnosis not present

## 2024-04-03 DIAGNOSIS — Z79891 Long term (current) use of opiate analgesic: Secondary | ICD-10-CM | POA: Diagnosis not present

## 2024-04-03 DIAGNOSIS — M542 Cervicalgia: Secondary | ICD-10-CM | POA: Diagnosis not present

## 2024-04-04 ENCOUNTER — Ambulatory Visit (INDEPENDENT_AMBULATORY_CARE_PROVIDER_SITE_OTHER): Admitting: Family Medicine

## 2024-04-04 ENCOUNTER — Encounter: Payer: Self-pay | Admitting: Family Medicine

## 2024-04-04 VITALS — BP 110/68 | HR 72 | Temp 98.7°F | Resp 18 | Ht 66.0 in | Wt 214.0 lb

## 2024-04-04 DIAGNOSIS — I83811 Varicose veins of right lower extremities with pain: Secondary | ICD-10-CM

## 2024-04-04 DIAGNOSIS — I83813 Varicose veins of bilateral lower extremities with pain: Secondary | ICD-10-CM

## 2024-04-04 DIAGNOSIS — M5137 Other intervertebral disc degeneration, lumbosacral region with discogenic back pain only: Secondary | ICD-10-CM | POA: Diagnosis not present

## 2024-04-04 DIAGNOSIS — E1165 Type 2 diabetes mellitus with hyperglycemia: Secondary | ICD-10-CM | POA: Diagnosis not present

## 2024-04-04 DIAGNOSIS — D352 Benign neoplasm of pituitary gland: Secondary | ICD-10-CM

## 2024-04-04 DIAGNOSIS — B0229 Other postherpetic nervous system involvement: Secondary | ICD-10-CM

## 2024-04-04 DIAGNOSIS — Z794 Long term (current) use of insulin: Secondary | ICD-10-CM

## 2024-04-04 NOTE — Progress Notes (Signed)
 Established Patient Office Visit  Subjective   Patient ID: Charlotte [name], female    DOB: May 01, 1956  Age: 68 y.o. MRN: [id number]  Chief Complaint  Patient presents with   Medical Management of Chronic Issues    HPI Discussed the use of AI scribe software for clinical note transcription with the patient, who gave verbal consent to proceed.  History of Present Illness  Charlotte [name] is a delightful 68 year old female with DDD, post-herpetic neuralgia , DMT2, HTN, mixed hyperlipidemia, lower extremity varicose veins, pituitary adenoma, vitamin D  deficiency who presents with persistent back and leg pain.   She continues to experience chronic back and hip pain, which she describes as related to her sciatic nerve. Despite attending physical therapy through [facility], her insurance has denied coverage for cold and hot packs used during therapy. She experiences severe pain upon waking, which sometimes impairs her ability to walk. Her gabapentin  dosage was increased to 300 mg four times a day, providing some relief, but significant discomfort persists, particularly in the hip area.  Since August, she has been experiencing pain localized to the back of her head, side of her neck, and down to her left shoulder, following a shingles outbreak. The pain is described as stinging and burning, with sensations of something crawling on her skin. Gabapentin  provides limited relief, and she continues to experience symptoms at night, causing sleep disturbances.  Increased her gabapentin  to 300 mg 4 times daily and she did get some benefit from this.  But she still reports an adequate control of her pain level.  Her diabetes management includes metformin  500 mg, which was recently increased to two tablets in the morning and two in the evening, along with Humalog and Basalog insulins.  She takes Basaglar 60 units every morning and 40 units every afternoon.  Her Humalog is typically 30 units with meals.  Fasting  blood sugar this morning was 180.  She reports occasional low blood sugar episodes, particularly when she skips meals, with levels dropping to the 50s, causing feelings of potential blackout but no shaking or sweating until her blood sugar is very low. Her A1c was last recorded at 8.2%.  She has a history of varicose veins, which cause pain and swelling, in her legs. She describes significant discomfort and visible swelling, with the veins appearing as if they might burst.  She has undergone two brain MRIs to monitor her pituitary gland mass.  Her next scheduled MRI is in May 2026. She has advanced moderate degree of chronic small ischemic changes characterized by patchy periventricular ad deep white matter hyperintensites.  Chronic left cerebellar infarct.        Objective:     BP 110/68 (BP Location: Left Arm, Patient Position: Sitting, Cuff Size: Normal)   Pulse 72   Temp 98.7 F (37.1 C) (Oral)   Resp 18   Ht 5' 6 (1.676 m)   Wt 214 lb (97.1 kg)   SpO2 95%   BMI 34.54 kg/m    Physical Exam Vitals and nursing note reviewed.  Constitutional:      Appearance: Normal appearance.  HENT:     Head: Normocephalic and atraumatic.  Eyes:     Conjunctiva/sclera: Conjunctivae normal.  Cardiovascular:     Rate and Rhythm: Normal rate and regular rhythm.  Pulmonary:     Effort: Pulmonary effort is normal.     Breath sounds: Normal breath sounds.  Musculoskeletal:     Right lower leg: No edema.  Left lower leg: No edema.  Skin:    General: Skin is warm and dry.  Neurological:     Mental Status: She is alert and oriented to person, place, and time.  Psychiatric:        Mood and Affect: Mood normal.        Behavior: Behavior normal.        Thought Content: Thought content normal.        Judgment: Judgment normal.          No results found for any visits on 04/04/24.    The 10-year ASCVD risk score (Arnett DK, et al., 2019) is: 16.5%    Assessment & Plan:  Varicose  veins of right lower extremity with pain -     Ambulatory referral to Vascular Surgery  Varicose veins of bilateral lower extremities with pain Assessment & Plan: She has a large highly torturous varicose veins bilateral legs that she reports is constantly swollen and painful.  Will refer to vein and vascular for assessment.   Type 2 diabetes mellitus with hyperglycemia, with long-term current use of insulin  [facility]) Assessment & Plan: Control is inadequate with most recent A1c at 8.2%.  On Basaglar 60 every morning and 40 every afternoon and Humalog 30 with meals.  She is taking metformin  XR 1000 twice a day.  Goal with use of metformin  is to reduce the amount of insulin  required.  She often skips lunch or breakfast and then will have a low blood sugars in the  50s.  Advise she either needs to cut back on her insulin  or do not skip her meals.     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  Have increase gabapentin  to 300 mg 4 times a day which has helped some.  She has a history of having had ESI's but reports the shots are too painful to repeat.  She does have a TENS unit and uses it.   Postherpetic neuralgia Assessment & Plan: She experiences a burning stinging sensation left face and left side of neck and left shoulder.  Gabapentin  300 mg 4 times a day has been somewhat successful.  Discussed nutritional support for neurons and recommended alpha lipoic acid and MSM.   Non-functioning pituitary adenoma [facility]) Assessment & Plan: Has a benign pituitary adenoma.  Next imaging surveillance is May 2026.   Return in about 2 months (around 06/04/2024).    Tobin Witucki K Edda Orea, MD

## 2024-04-12 DIAGNOSIS — B0229 Other postherpetic nervous system involvement: Secondary | ICD-10-CM | POA: Insufficient documentation

## 2024-04-12 NOTE — Assessment & Plan Note (Signed)
 Control is inadequate with most recent A1c at 8.2%.  On Basaglar 60 every morning and 40 every afternoon and Humalog 30 with meals.  She is taking metformin  XR 1000 twice a day.  Goal with use of metformin  is to reduce the amount of insulin  required.  She often skips lunch or breakfast and then will have a low blood sugars in the  50s.  Advise she either needs to cut back on her insulin  or do not skip her meals.

## 2024-04-12 NOTE — Assessment & Plan Note (Signed)
 Has a benign pituitary adenoma.  Next imaging surveillance is May 2026.

## 2024-04-12 NOTE — Assessment & Plan Note (Signed)
 She experiences a burning stinging sensation left face and left side of neck and left shoulder.  Gabapentin  300 mg 4 times a day has been somewhat successful.  Discussed nutritional support for neurons and recommended alpha lipoic acid and MSM.

## 2024-04-12 NOTE — Assessment & Plan Note (Signed)
 She has a large highly torturous varicose veins bilateral legs that she reports is constantly swollen and painful.  Will refer to vein and vascular for assessment.

## 2024-04-12 NOTE — Assessment & Plan Note (Signed)
 He is going to physical therapy which helps some.  Have increase gabapentin  to 300 mg 4 times a day which has helped some.  She has a history of having had ESI's but reports the shots are too painful to repeat.  She does have a TENS unit and uses it.

## 2024-04-17 ENCOUNTER — Other Ambulatory Visit
Admission: RE | Admit: 2024-04-17 | Discharge: 2024-04-17 | Disposition: A | Discharge status: Home / Self Care | Source: Ambulatory Visit | Attending: Ophthalmology | Admitting: Ophthalmology

## 2024-04-17 DIAGNOSIS — E113212 Type 2 diabetes mellitus with mild nonproliferative diabetic retinopathy with macular edema, left eye: Secondary | ICD-10-CM | POA: Diagnosis not present

## 2024-04-17 DIAGNOSIS — E113291 Type 2 diabetes mellitus with mild nonproliferative diabetic retinopathy without macular edema, right eye: Secondary | ICD-10-CM | POA: Diagnosis not present

## 2024-04-17 DIAGNOSIS — B0052 Herpesviral keratitis: Secondary | ICD-10-CM | POA: Diagnosis not present

## 2024-04-17 DIAGNOSIS — H179 Unspecified corneal scar and opacity: Secondary | ICD-10-CM | POA: Diagnosis not present

## 2024-04-17 LAB — RAPID HIV SCREEN (HIV 1/2 AB+AG)
HIV 1/2 Antibodies: NONREACTIVE
HIV-1 P24 Antigen - HIV24: NONREACTIVE

## 2024-04-17 LAB — OPHTHALMOLOGY REPORT-SCANNED

## 2024-04-23 DIAGNOSIS — Z794 Long term (current) use of insulin: Secondary | ICD-10-CM | POA: Diagnosis not present

## 2024-04-23 DIAGNOSIS — E1165 Type 2 diabetes mellitus with hyperglycemia: Secondary | ICD-10-CM | POA: Diagnosis not present

## 2024-04-23 DIAGNOSIS — E559 Vitamin D deficiency, unspecified: Secondary | ICD-10-CM | POA: Diagnosis not present

## 2024-04-23 DIAGNOSIS — E1140 Type 2 diabetes mellitus with diabetic neuropathy, unspecified: Secondary | ICD-10-CM | POA: Diagnosis not present
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
## 2024-04-27 ENCOUNTER — Emergency Department

## 2024-04-27 ENCOUNTER — Other Ambulatory Visit: Payer: Self-pay

## 2024-04-27 ENCOUNTER — Emergency Department
Admission: EM | Admit: 2024-04-27 | Discharge: 2024-04-27 | Disposition: A | Discharge status: Home / Self Care | Attending: Emergency Medicine | Admitting: Emergency Medicine

## 2024-04-27 ENCOUNTER — Encounter: Payer: Self-pay | Admitting: Family Medicine

## 2024-04-27 DIAGNOSIS — I10 Essential (primary) hypertension: Secondary | ICD-10-CM | POA: Insufficient documentation

## 2024-04-27 DIAGNOSIS — R911 Solitary pulmonary nodule: Secondary | ICD-10-CM | POA: Diagnosis not present

## 2024-04-27 DIAGNOSIS — J209 Acute bronchitis, unspecified: Secondary | ICD-10-CM | POA: Insufficient documentation

## 2024-04-27 DIAGNOSIS — R0602 Shortness of breath: Secondary | ICD-10-CM | POA: Insufficient documentation

## 2024-04-27 DIAGNOSIS — E119 Type 2 diabetes mellitus without complications: Secondary | ICD-10-CM | POA: Diagnosis not present

## 2024-04-27 DIAGNOSIS — R059 Cough, unspecified: Secondary | ICD-10-CM | POA: Diagnosis present

## 2024-04-27 DIAGNOSIS — J40 Bronchitis, not specified as acute or chronic: Secondary | ICD-10-CM | POA: Diagnosis not present

## 2024-04-27 DIAGNOSIS — E041 Nontoxic single thyroid nodule: Secondary | ICD-10-CM | POA: Diagnosis not present

## 2024-04-27 LAB — TROPONIN T, HIGH SENSITIVITY
Troponin T High Sensitivity: 28 ng/L — ABNORMAL HIGH (ref 0–19)
Troponin T High Sensitivity: 25 ng/L — ABNORMAL HIGH (ref 0–19)

## 2024-04-27 LAB — D-DIMER, QUANTITATIVE: D-Dimer, Quant: 0.62 ug{FEU}/mL — ABNORMAL HIGH (ref 0.00–0.50)

## 2024-04-27 LAB — RESPIRATORY PANEL BY PCR

## 2024-04-27 LAB — BASIC METABOLIC PANEL WITH GFR
Anion gap: 10 (ref 5–15)
BUN: 13 mg/dL (ref 8–23)
CO2: 30 mmol/L (ref 22–32)
Calcium: 10.1 mg/dL (ref 8.9–10.3)
Chloride: 102 mmol/L (ref 98–111)
Creatinine, Ser: 0.93 mg/dL (ref 0.44–1.00)
GFR, Estimated: >60 mL/min (ref >60)
Glucose, Bld: 119 mg/dL — ABNORMAL HIGH (ref 70–99)
Potassium: 3.5 mmol/L (ref 3.5–5.1)
Sodium: 142 mmol/L (ref 135–145)

## 2024-04-27 LAB — RESP PANEL BY RT-PCR (RSV, FLU A&B, COVID)  RVPGX2
Influenza A by PCR: NEGATIVE
Influenza B by PCR: NEGATIVE
Resp Syncytial Virus by PCR: NEGATIVE
SARS Coronavirus 2 by RT PCR: NEGATIVE

## 2024-04-27 LAB — CBC
HCT: 37 % (ref 36.0–46.0)
Hemoglobin: 12 g/dL (ref 12.0–15.0)
MCH: 28.6 pg (ref 26.0–34.0)
MCHC: 32.4 g/dL (ref 30.0–36.0)
MCV: 88.1 fL (ref 80.0–100.0)
Platelets: 358 K/uL (ref 150–400)
RBC: 4.2 MIL/uL (ref 3.87–5.11)
RDW: 13 % (ref 11.5–15.5)
WBC: 6.8 K/uL (ref 4.0–10.5)
nRBC: 0 % (ref 0.0–0.2)

## 2024-04-27 LAB — PRO BRAIN NATRIURETIC PEPTIDE: Pro Brain Natriuretic Peptide: 401 pg/mL — ABNORMAL HIGH (ref <300.0)

## 2024-04-27 MED ORDER — PREDNISONE 20 MG PO TABS
40.0000 mg | ORAL_TABLET | Freq: Once | ORAL | Status: AC
  Administered 2024-04-27: 40 mg via ORAL
  Filled 2024-04-27: qty 2

## 2024-04-27 MED ORDER — PREDNISONE 20 MG PO TABS: 40.0000 mg | ORAL_TABLET | Freq: Every day | ORAL | 0 refills | Status: AC

## 2024-04-27 MED ORDER — FUROSEMIDE 20 MG PO TABS: 20.0000 mg | ORAL_TABLET | Freq: Every day | ORAL | 0 refills | Status: DC

## 2024-04-27 MED ORDER — ALBUTEROL SULFATE HFA 108 (90 BASE) MCG/ACT IN AERS: 2.0000 | INHALATION_SPRAY | Freq: Four times a day (QID) | RESPIRATORY_TRACT | 0 refills | Status: AC | PRN

## 2024-04-27 MED ORDER — IPRATROPIUM-ALBUTEROL 0.5-2.5 (3) MG/3ML IN SOLN
3.0000 mL | Freq: Once | RESPIRATORY_TRACT | Status: AC
  Administered 2024-04-27: 3 mL via RESPIRATORY_TRACT
  Filled 2024-04-27: qty 3

## 2024-04-27 MED ORDER — IOHEXOL 350 MG/ML SOLN
75.0000 mL | Freq: Once | INTRAVENOUS | Status: AC | PRN
  Administered 2024-04-27: 75 mL via INTRAVENOUS

## 2024-04-27 MED ORDER — FUROSEMIDE 10 MG/ML IJ SOLN
20.0000 mg | Freq: Once | INTRAMUSCULAR | Status: AC
  Administered 2024-04-27: 20 mg via INTRAVENOUS
  Filled 2024-04-27: qty 4

## 2024-04-27 MED ORDER — AMLODIPINE BESYLATE 5 MG PO TABS
10.0000 mg | ORAL_TABLET | ORAL | Status: AC
  Administered 2024-04-27: 10 mg via ORAL
  Filled 2024-04-27: qty 2

## 2024-04-27 NOTE — Discharge Instructions (Addendum)
 Please follow-up with the pulmonary clinic with Dr. Theotis or Dr. Aleskerov.   Return to the emergency room right away if you have worsening symptoms, difficulty breathing, develop a fever, chest pain, or other concerns or symptoms arise.  Is extremely important you have close follow-up with both your primary doctor for today's evaluation and findings as well as one of our lung doctors.  Please schedule this, call Monday to make appointments.  Please follow-up with our lung nodule clinic.  Also, if your following with [facility] for this reason, please call your doctor so they can also review the results of today's CT scan and arrange appropriate lung nodule follow-up at [facility].

## 2024-04-27 NOTE — ED Provider Notes (Signed)
 [facility] Provider Note   Event Date/Time   First MD Initiated Contact with Patient 04/27/24 [phone]     (approximate)  History   Shortness of Breath  HPI  Charlotte [name] is a 68 y.o. female diabetes hypertension and past medical history as documented also below  Recent PCP visit November 21 documents history of varicose veins, type 2 diabetes with insulin  use, degenerative disc disease    Patient reports that regularly she will wake up in the morning with some congestion and cough slightly, yesterday though she felt like she had more nasal congestion slight cough nonproductive.  This morning when she woke up the congestion felt more than typical.  She does not have any wheezing.  No chest pain.  She reports this feels short of breath primarily worse this morning seems to be slowly improving.  When she woke up this morning she had quite a bit of coughing maybe a little bit of wheezing it is now resolved  Past Medical History:  Diagnosis Date   DDD (degenerative disc disease), lumbar    Depression    Diabetes mellitus without complication (HCC)    Diverticulitis    Fatty liver    Fibroid tumor    GERD (gastroesophageal reflux disease)    Gout    H pylori ulcer    Hemorrhoids    Hypercholesteremia    Hypertension    IBS (irritable bowel syndrome)    IDA (iron deficiency anemia)    OA (osteoarthritis)    Obesity    RA (rheumatoid arthritis) (HCC)    Sleep apnea    Spinal stenosis    Vitamin D  deficiency    She has no history of blood clots.  She follows with cardiology reports she had a stress test earlier this year.  She follows Dr. Florencio.  Has been no leg swelling except for 2 years she has had swelling and varicose veins in just her right leg, she reports that doctor wants to try to get an ultrasound to make sure is no blood clots but her insurance denied it  No abdominal pain.  No nausea or vomiting no fevers but she felt a little bit yesterday  like she might have a slight chill developing and a runny nose.  Blood pressure elevated but patient advises she has not yet had her amlodipine  or losartan  today  Physical Exam   Triage Vital Signs: ED Triage Vitals  Encounter Vitals Group     BP 04/27/24 0918 (!) 160/91     Girls Systolic BP Percentile --      Girls Diastolic BP Percentile --      Boys Systolic BP Percentile --      Boys Diastolic BP Percentile --      Pulse Rate 04/27/24 0917 78     Resp 04/27/24 0917 20     Temp 04/27/24 0918 98.2 F (36.8 C)     Temp Source 04/27/24 0918 Oral     SpO2 04/27/24 0917 100 %     Weight 04/27/24 0919 215 lb (97.5 kg)     Height 04/27/24 0919 5' 6 (1.676 m)     Head Circumference --      Peak Flow --      Pain Score 04/27/24 0918 7     Pain Loc --      Pain Education --      Exclude from Growth Chart --     Most recent vital signs: Vitals:  04/27/24 1200 04/27/24 1310  BP: 138/67   Pulse:    Resp:    Temp:  98.2 F (36.8 C)  SpO2:      General: Awake, no distress.  Patient currently resting on room air without hypoxia. CV:  Good peripheral perfusion.  Normal tones and rate. Resp:  Normal effort.  Clear bilateral.  Currently no active coughing wheezing.  Her lung sounds are clear no distress oxygen saturation normal Abd:  No distention.  Soft nontender Other:  Extremity venous cords or congestion with exception to the right upper thigh where she reports notable varicose veins been present for about 2 years.  No pedal edema  ED Results / Procedures / Treatments   Labs (all labs ordered are listed, but only abnormal results are displayed) Labs Reviewed  BASIC METABOLIC PANEL WITH GFR - Abnormal; Notable for the following components:      Result Value   Glucose, Bld 119 (*)    All other components within normal limits  D-DIMER, QUANTITATIVE - Abnormal; Notable for the following components:   D-Dimer, Quant 0.62 (*)    All other components within normal limits   TROPONIN T, HIGH SENSITIVITY - Abnormal; Notable for the following components:   Troponin T High Sensitivity 28 (*)    All other components within normal limits  TROPONIN T, HIGH SENSITIVITY - Abnormal; Notable for the following components:   Troponin T High Sensitivity 25 (*)    All other components within normal limits  RESP PANEL BY RT-PCR (RSV, FLU A&B, COVID)  RVPGX2  RESPIRATORY PANEL BY PCR  CBC  PRO BRAIN NATRIURETIC PEPTIDE   Normal CBC.  Troponin elevated at 28, minimal but present.  Normal metabolic panel  EKG  And interpreted by me at 920 heart rate 80 QRS 100 QTc 430 Sinus with first-degree AV block rhythm no evidence of acute ischemia.  No discrete ischemic changes there is somewhat diffuse T wave flattening throughout.   RADIOLOGY  Chest x-ray inter by me is clear though query mild cardiomegaly  CT Angio Chest PE W and/or Wo Contrast Result Date: 04/27/2024 CLINICAL DATA:  Pulmonary embolism (PE) suspected, low to intermediate prob, positive D-dimer EXAM: CT ANGIOGRAPHY CHEST WITH CONTRAST TECHNIQUE: Multidetector CT imaging of the chest was performed using the standard protocol during bolus administration of intravenous contrast. Multiplanar CT image reconstructions and MIPs were obtained to evaluate the vascular anatomy. RADIATION DOSE REDUCTION: This exam was performed according to the departmental dose-optimization program which includes automated exposure control, adjustment of the mA and/or kV according to patient size and/or use of iterative reconstruction technique. CONTRAST:  75mL OMNIPAQUE  IOHEXOL  350 MG/ML SOLN COMPARISON:  November 20, 2022, July 17, 2019 FINDINGS: Cardiovascular: Heart is enlarged. Main pulmonary artery is enlarged in relation to the ascending thoracic aorta. Evaluation is mildly limited by motion. No pulmonary embolism is visualized through the proximal subsegmental pulmonary arteries. Thoracic aorta is normal in course and caliber. No pericardial  effusion. Mediastinum/Nodes: Possible 22 mm isthmus thyroid  nodule. No axillary or mediastinal adenopathy. Lungs/Pleura: No pleural effusion or pneumothorax. Background of mosaic attenuation. RIGHT upper lobe irregular pulmonary nodule measures 15 x 11 by 12 mm, previously 10 x 9 by 8 mm in 2021 (series 5, image 41). This appears to be a mix of ground-glass and solid with favored overall increased solid component since 2021. Upper Abdomen: No acute abnormality. Musculoskeletal: No acute osseous abnormality Review of the MIP images confirms the above findings. IMPRESSION: 1. No pulmonary embolism through  the proximal subsegmental pulmonary arteries. 2. RIGHT upper lobe irregular pulmonary nodule measures 15 x 11 x 12 mm, previously 10 x 9 x 8 mm in 2021. This is concerning for a slow growing neoplasm. Recommend thoracic surgery consultation if not previously performed. Outpatient PET/CT should be considered. These recommendations are taken from: Recommendations for the Management of Subsolid Pulmonary Nodules Detected at CT: A Statement from the Fleischner Society Radiology 2013; 266:1, 304-317. 3. Possible 22 mm isthmus thyroid  nodule. Recommend nonemergent thyroid  US  (ref: J Am Coll Radiol. 2015 Feb;12(2): 143-50). 4. Enlarged main pulmonary artery in relation to the ascending thoracic aorta. This can be seen in the setting of pulmonary arterial hypertension. 5. Background of mosaic attenuation, which can be seen in the setting of small airways disease. Electronically Signed   By: Corean Salter M.D.   On: 04/27/2024 13:51   US  Venous Img Lower Bilateral Result Date: 04/27/2024 CLINICAL DATA:  dyspnea, exclude DVTs. Patient reports chronic R leg swelling but no prior eval for dvt EXAM: BILATERAL LOWER EXTREMITY VENOUS DOPPLER ULTRASOUND TECHNIQUE: Gray-scale sonography with compression, as well as color and duplex ultrasound, were performed to evaluate the deep venous system(s) from the level of the common  femoral vein through the popliteal and proximal calf veins. COMPARISON:  November 03, 2017. FINDINGS: VENOUS Normal compressibility of the common femoral, superficial femoral, and popliteal veins, as well as the visualized calf veins. Visualized portions of profunda femoral vein and great saphenous vein unremarkable. No filling defects to suggest DVT on grayscale or color Doppler imaging. Doppler waveforms show normal direction of venous flow, normal respiratory plasticity and response to augmentation. OTHER None. Limitations: none IMPRESSION: Negative. Electronically Signed   By: Corean Salter M.D.   On: 04/27/2024 11:56   DG Chest 2 View Result Date: 04/27/2024 CLINICAL DATA:  Shortness of breath. EXAM: CHEST - 2 VIEW COMPARISON:  11/20/2022 FINDINGS: No focal consolidation, pulmonary edema, pleural effusion, or evidence of pneumothorax. Subtle vague 9 mm nodular density identified over the right upper lobe. The cardio pericardial silhouette is enlarged. No acute bony abnormality. IMPRESSION: 1. No acute cardiopulmonary findings. 2. Subtle vague 9 mm nodular density over the right upper lobe. Potentially a confluence of shadows, follow-up CT chest without contrast recommended to further evaluate. Electronically Signed   By: Camellia Candle M.D.   On: 04/27/2024 10:00       PROCEDURES:  Critical Care performed: No  Procedures   MEDICATIONS ORDERED IN ED: Medications  ipratropium-albuterol  (DUONEB) 0.5-2.5 (3) MG/3ML nebulizer solution 3 mL (3 mLs Nebulization Given 04/27/24 1024)  predniSONE  (DELTASONE ) tablet 40 mg (40 mg Oral Given 04/27/24 1024)  amLODipine  (NORVASC ) tablet 10 mg (10 mg Oral Given 04/27/24 1038)  iohexol  (OMNIPAQUE ) 350 MG/ML injection 75 mL (75 mLs Intravenous Contrast Given 04/27/24 1313)  furosemide  (LASIX ) injection 20 mg (20 mg Intravenous Given 04/27/24 1419)     IMPRESSION / MDM / ASSESSMENT AND PLAN / ED COURSE  I reviewed the triage vital signs and the nursing  notes.                              Differential diagnosis includes, but is not limited to, possible mild viral syndrome bronchitis with reported slight chills runny nose, no evidence of pneumonia oxygen saturation normal work of breathing normal, carries no history of COPD or asthma, chest x-ray query mild cardiomegaly but previous echo recently that looked quite reassuring thus doubtful.  She has no findings of CHF.  She does not carry history of pulmonary embolism or thrombosis but does have chronic varicosity in the right leg I think it is best to exclude DVT and given her dyspnea also obtain D-dimer to exclude PE in a low risk patient new   No noted symptoms of ACS.  Repeat troponin stable slight downtrend  She does report that she is able to adjust her insulin  regimens when she has been on prednisone  in the past.  I suspect this may be bronchitis, especially if we are unable to rule out PE.  She does report having cough some wheezing in the morning, better now.  Plan to place her on prednisone  and provide albuterol  prescription if the remaining workup is reassuring as at this point she appears quite well.  Labs reassuring overall.  Previous cardiac workup outpatient has been unrevealing for ACS or coronary disease  Patient's presentation is most consistent with acute complicated illness / injury requiring diagnostic workup.      Clinical Course as of 04/27/24 1445  Sun Apr 27, 2024  1009 Jan 2025 cardiology echo: NORMAL LEFT VENTRICULAR SYSTOLIC FUNCTION WITH MILD LVH ESTIMATED EF: >55% ELEVATED LA PRESSURES WITH DIASTOLIC DYSFUNCTION (GRADE 2) NORMAL RIGHT VENTRICULAR SYSTOLIC FUNCTION VALVULAR REGURGITATION: TRIVIAL AR, MILD MR, No PR, MILD TR NO VALVULAR STENOSIS   [MQ]  1014 Discussed with Dr. Carnella, found CTA from this year of coronary.  Very reassuring calcium  score.  Based on her lack of chest pain very minimally elevated troponin, I think highly unlikely ACS.  If repeat  troponin shows no significant delta change would feel we effectively ruled out ACS as cause of dyspnea today  IMPRESSION: 1. Coronary calcium  score of 0.   2. Normal coronary origin with right dominance.   3. No evidence of CAD.   4. CAD-RADS 0. Consider non-atherosclerotic causes of chest pain.  [MQ]  1221 Patient is resting comfortably.  Reviewed x-ray and workup to this point with her.  Reassuring repeat troponin downtrending.  D-dimer is positive, will proceed with CT PE study.  Discussed with patient nodule on x-ray she reports [facility] is already aware and following her for pulmonary nodules [MQ]  1416 Reviewed CT findings with Dr. Aleskerov as well as the patient's medical history exam and evaluation etc.  He suggest that he would recommend the patient be treated with short course steroid, mosaic attenuation could be related to viral pattern, pulmonary hypertension, or if elevated BNP would be suspicious for possible CHF.  He recommends short course of diuretic therapy and would be happy to follow-up closely outpatient. [MQ]  1417 Inform patient of lung nodule and thyroid  nodule.  She is agreeable to set up follow-up through both via [facility], pulmonary, and [MQ]  1417 Care.  Primary care follow-up on thyroid  [MQ]  1432 Blood pressure is normalized.  Levophed titrated down to 1 mcg/min by me, reassessed and now his blood pressure remains normalized.  If turned off Levophed.  Patient currently awake alert oriented well-oriented very conversant appears much improved.  Wife at bedside. [MQ]  1441 Discussed with patient, BNP sent.  Will be helpful outpatient management.  We will give her diuresis for 3 days, she will monitor symptoms closely and continue steroid as well as inhaler if wheezing.  Difficult to ascertain if this represents atypical viral type cause, volume overload or pulmonary hypertension.  Patient agreeable with outpatient follow-up for symptoms today, lung nodule and thyroid  nodule.   Multiple follow-ups for  her required.  She is agreeable to follow-up with Dr. Aleskerov or Dr Brigida (seen prior). [MQ]  1442 Alert oriented no distress. [MQ]  1442 No findings suggestive of acute bacterial pneumonia. [MQ]    Clinical Course User Index [MQ] Dicky Anes, MD   Discussed plan with patient, if CT PE study is negative for acute or concerning finding she is comfortable with plan to go home with prednisone , inhaler if still having some wheezing or congestion, and close outpatient follow-up with careful return precautions.  Patient very agreeable with this plan feeling improved at this time resting in no distress.  Awaiting CT to rule out PE or evaluate for subtle perhaps occult pneumonia    Return precautions and treatment recommendations and follow-up discussed with the patient who is agreeable with the plan.   FINAL CLINICAL IMPRESSION(S) / ED DIAGNOSES   Final diagnoses:  Acute bronchitis, unspecified organism  Thyroid  nodule  Lung nodule  Shortness of breath   Vitals:   04/27/24 1200 04/27/24 1310  BP: 138/67   Pulse:    Resp:    Temp:  98.2 F (36.8 C)  SpO2:       Complex workup, at this point though she is resting comfortably normal work of breathing.  Have treated with Lasix  in the event the patient has volume overload.  She is respirating well comfortably symptoms improved.  Additionally treating with steroids in the event of atypical viral type etiology or bronchitis.  No obvious findings to suggest acute bacterial infection.  Patient will have follow-up outpatient for multiple issues.  At this point she appears stable and appropriate for outpatient follow-up  Rx / DC Orders   ED Discharge Orders          Ordered    AMB  Referral to Pulmonary Nodule Clinic        04/27/24 1032    predniSONE  (DELTASONE ) 20 MG tablet  Daily with breakfast        04/27/24 1444    furosemide  (LASIX ) 20 MG tablet  Daily        04/27/24 1444    albuterol  (VENTOLIN  HFA) 108  (90 Base) MCG/ACT inhaler  Every 6 hours PRN        04/27/24 1444             Note:  This document was prepared using Dragon voice recognition software and may include unintentional dictation errors.   Dicky Anes, MD 04/27/24 [phone]

## 2024-04-27 NOTE — ED Triage Notes (Signed)
 Pt to ED for SOB since 530am today after got out of bed. States BP was also high (SBP 160s). Also feels nauseous. No dizziness or CP. Respirations are unlabored.

## 2024-05-01 ENCOUNTER — Telehealth: Payer: Self-pay

## 2024-05-01 DIAGNOSIS — M5459 Other low back pain: Secondary | ICD-10-CM | POA: Diagnosis not present

## 2024-05-01 DIAGNOSIS — G894 Chronic pain syndrome: Secondary | ICD-10-CM | POA: Diagnosis not present

## 2024-05-01 DIAGNOSIS — Z79891 Long term (current) use of opiate analgesic: Secondary | ICD-10-CM | POA: Diagnosis not present

## 2024-05-01 DIAGNOSIS — M542 Cervicalgia: Secondary | ICD-10-CM | POA: Diagnosis not present

## 2024-05-01 NOTE — Telephone Encounter (Signed)
 Patient was identified as falling into the [place] Measure - Diabetes.   Patient was: Appointment already scheduled for:  05/27/24.

## 2024-05-02 ENCOUNTER — Telehealth: Payer: Self-pay

## 2024-05-02 NOTE — Telephone Encounter (Signed)
 Patient was identified as falling into the [place] Measure - Diabetes.   Patient was: Appointment already scheduled for:  12/23.

## 2024-05-05 ENCOUNTER — Encounter: Payer: Self-pay | Admitting: Family Medicine

## 2024-05-05 ENCOUNTER — Ambulatory Visit: Admitting: Family Medicine

## 2024-05-05 VITALS — BP 103/67 | HR 76 | Ht 66.0 in | Wt 216.2 lb

## 2024-05-05 DIAGNOSIS — I5031 Acute diastolic (congestive) heart failure: Secondary | ICD-10-CM

## 2024-05-05 DIAGNOSIS — Z794 Long term (current) use of insulin: Secondary | ICD-10-CM | POA: Diagnosis not present

## 2024-05-05 DIAGNOSIS — E1165 Type 2 diabetes mellitus with hyperglycemia: Secondary | ICD-10-CM

## 2024-05-05 DIAGNOSIS — E1142 Type 2 diabetes mellitus with diabetic polyneuropathy: Secondary | ICD-10-CM | POA: Diagnosis not present

## 2024-05-05 DIAGNOSIS — R911 Solitary pulmonary nodule: Secondary | ICD-10-CM

## 2024-05-05 DIAGNOSIS — J810 Acute pulmonary edema: Secondary | ICD-10-CM

## 2024-05-05 LAB — POCT GLYCOSYLATED HEMOGLOBIN (HGB A1C)
HbA1c POC (<> result, manual entry): 8.5 %
Hemoglobin A1C: 8.5 % — AB (ref 4.0–5.6)

## 2024-05-05 MED ORDER — FUROSEMIDE 20 MG PO TABS: ORAL_TABLET | ORAL | 3 refills | Status: AC

## 2024-05-05 NOTE — Progress Notes (Signed)
 "  Established Patient Office Visit  Subjective   Patient ID: Charlotte [name], female    DOB: 20-Oct-1955  Age: 68 y.o. MRN: [id number]  Chief Complaint  Patient presents with   Follow-up    Pt was recently seen in the ED, had a CT performed. Is scheduled to have a visit with pulmonary to discuss the CT. Also states on the CT, fluid around lung was seen.    HPI Charlotte [name] is a delightful 68 year old female with DDD, RA (without RF), post-herpetic neuralgia, DMT2, HTN, mixed hyperlipidemia, lower extremity varicose veins, nonfunctioning pituitary adenoma, vitamin D  deficiency, OSA (not on CPAP), pulmonary nodule who presents after recent ED visit for SOB.   Discussed the use of AI scribe software for clinical note transcription with the patient, who gave verbal consent to proceed.  History of Present Illness    Charlotte [name] has had a pulmonary nodule on the RUL for several years, with CT scan from ER showing slight growth, suspicious for slow growing neoplasm.  Also on the scan, a 22 mm isthmus thyroid  nodule was also discovered.     Charlotte [name] experienced shortness of breath one morning after making Charlotte [name] bed and moving around the house. Despite sitting down and trying to get fresh air, the sensation persisted. Charlotte [name] husband took Charlotte [name] to the emergency room where Charlotte [name] underwent an EKG and blood tests, which showed elevated cardiac markers that decreased by the time Charlotte [name] left the ER. A CT scan with contrast confirmed the presence of the pulmonary nodule.  In the ER,  Charlotte [name] was told Charlotte [name] had fluid on the left side of Charlotte [name] lung and was prescribed Lasix  20 mg, which Charlotte [name] took four times, resulting in frequent urination. Charlotte [name] weight fluctuates between 214 and 218 pounds, and Charlotte [name] monitors it regularly. Charlotte [name] notes tenderness in Charlotte [name] legs, particularly the right leg, but no swelling. In the ER, an ultrasound of Charlotte [name] legs showed no clots.  Charlotte [name] diabetes management is ongoing, with Charlotte [name] A1c recently recorded at 8.5%, up from 8.2. Charlotte [name]  is under the care of an endocrinologist. Charlotte [name] previously experienced pancreatitis attacks while on Jardiance and is currently not on this medication. Charlotte [name] finds the addition of new medications stressful.  Charlotte [name] has a history of shingles, resulting in persistent hip and leg pain. Charlotte [name] underwent six weeks of physical therapy without significant improvement. Charlotte [name] describes numbness and pain radiating down the side of Charlotte [name] face and into Charlotte [name] neck, for which Charlotte [name] takes gabapentin  300 mg four times a day.  Charlotte [name] has a history of spine surgery and previously had an MRI. A recent MRI request was denied due to insurance issues, as Charlotte [name] claimed Charlotte [name] had already had one. Charlotte [name] completed six weeks of physical therapy as required by Charlotte [name] insurance before an MRI could be reconsidered.       Objective:     BP 103/67 (BP Location: Left Arm, Patient Position: Sitting, Cuff Size: Large)   Pulse 76   Ht 5' 6 (1.676 m)   Wt 216 lb 3.2 oz (98.1 kg)   SpO2 97%   BMI 34.90 kg/m    Physical Exam Vitals and nursing note reviewed.  Constitutional:      Appearance: Normal appearance.  HENT:     Head: Normocephalic and atraumatic.  Eyes:     Conjunctiva/sclera: Conjunctivae normal.  Cardiovascular:     Rate and Rhythm: Normal rate and regular rhythm.  Pulmonary:     Effort: Pulmonary effort is normal.  Breath sounds: Normal breath sounds.  Musculoskeletal:     Right lower leg: No edema.     Left lower leg: No edema.  Skin:    General: Skin is warm and dry.  Neurological:     Mental Status: Charlotte [name] is alert and oriented to person, place, and time.  Psychiatric:        Mood and Affect: Mood normal.        Behavior: Behavior normal.        Thought Content: Thought content normal.        Judgment: Judgment normal.          Results for orders placed or performed in visit on 05/05/24  POCT glycosylated hemoglobin (Hb A1C)  Result Value Ref Range   Hemoglobin A1C 8.5 (A) 4.0 - 5.6 %   HbA1c POC (<> result, manual  entry) 8.5 4.0 - 5.6 %   HbA1c, POC (prediabetic range)     HbA1c, POC (controlled diabetic range)        The 10-year ASCVD risk score (Arnett DK, et al., 2019) is: 14.4%    Assessment & Plan:  Type 2 diabetes mellitus with hyperglycemia, with long-term current use of insulin  (HCC) -     POCT glycosylated hemoglobin (Hb A1C)  Acute pulmonary edema (HCC) -     Furosemide ; If weight is 218 or higher, Take one po q day until weight back to 216 or lower.  Dispense: 30 tablet; Refill: 3  Acute diastolic congestive heart failure [facility]) Assessment & Plan: Weigh yourself daily.  If your weight gets above 218 then take a furosemide  once daily until weight is down to 216 or lower.  Solitary pulmonary nodule Assessment & Plan: Has an RUL nodule 15 x 11 x 12 mm has been followed at [facility] pulmonary for this.  Diabetic polyneuropathy associated with type 2 diabetes mellitus (HCC) Assessment & Plan: Hemoglobin A1c today is 8.5% no significant change over the last 3 months goes to endocrinology at [facility].  Cannot take Jardiance because Charlotte [name] got pancreatitis from that.      Return in about 3 months (around 08/03/2024).    Princesa Willig K Marcea Rojek, MD "

## 2024-05-10 ENCOUNTER — Other Ambulatory Visit: Payer: Self-pay | Admitting: Family Medicine

## 2024-05-10 DIAGNOSIS — I509 Heart failure, unspecified: Secondary | ICD-10-CM | POA: Insufficient documentation

## 2024-05-10 DIAGNOSIS — I152 Hypertension secondary to endocrine disorders: Secondary | ICD-10-CM

## 2024-05-10 NOTE — Assessment & Plan Note (Signed)
 Weigh yourself daily.  If your weight gets above 218 then take a furosemide  once daily until weight is down to 216 or lower.

## 2024-05-10 NOTE — Assessment & Plan Note (Signed)
 Hemoglobin A1c today is 8.5% no significant change over the last 3 months goes to endocrinology at [facility].  Cannot take Jardiance because she got pancreatitis from that.

## 2024-05-10 NOTE — Assessment & Plan Note (Signed)
 Has an RUL nodule 15 x 11 x 12 mm has been followed at [facility] pulmonary for this.

## 2024-05-20 ENCOUNTER — Encounter: Payer: Self-pay | Admitting: Pulmonary Disease

## 2024-05-20 ENCOUNTER — Ambulatory Visit: Admitting: Pulmonary Disease

## 2024-05-20 ENCOUNTER — Telehealth: Payer: Self-pay

## 2024-05-20 VITALS — BP 110/80 | HR 78 | Temp 97.6°F | Ht 66.0 in | Wt 216.6 lb

## 2024-05-20 DIAGNOSIS — R911 Solitary pulmonary nodule: Secondary | ICD-10-CM

## 2024-05-20 DIAGNOSIS — G4733 Obstructive sleep apnea (adult) (pediatric): Secondary | ICD-10-CM | POA: Diagnosis not present

## 2024-05-20 DIAGNOSIS — Z87891 Personal history of nicotine dependence: Secondary | ICD-10-CM

## 2024-05-20 HISTORY — DX: Solitary pulmonary nodule: R91.1

## 2024-05-20 NOTE — Telephone Encounter (Signed)
 Robotic bronchoscopy with EBUS 05/29/2024 8:00 am R91.1 CPT Code: 68372, 31652, 31653  Charlotte [name] please see Bronch info.

## 2024-05-20 NOTE — Telephone Encounter (Signed)
 Prior Auth Not Required for the codes 68372, [id number], [id number] Refer # [id number] 05/20/2024 Charlotte [name]

## 2024-05-20 NOTE — Progress Notes (Addendum)
 "  Subjective:    Patient ID: Charlotte [name], female    DOB: 1955/11/30, 69 y.o.   MRN: [id number]  Patient Care Team: Ziglar, Susan K, MD as PCP - General (Family Medicine)  Chief Complaint  Patient presents with   Consult    Occasional dry cough.     BACKGROUND: Patient is a 69 year old former smoker (16 PY) and a history as noted below, who presents for evaluation of a right upper lobe nodule noted on CT chest 27 April 2024.  She is kindly referred by Dr. Oneil Budge, her primary care physician is Susan Ziglar.   HPI Discussed the use of AI scribe software for clinical note transcription with the patient, who gave verbal consent to proceed.  History of Present Illness   Charlotte [name] is a 69 year old female who presents for evaluation of a lung nodule on the right upper lobe.  She has a lung nodule on the right upper lobe, which was initially noted for March 2021 CT chest measuring 10 x 9 mm at that time.  Subsequently a CT chest abdomen and pelvis on 20 November 2022 showed the nodule to be 1.5 cm.  She had a visit to the ED on 14 December and a chest CTA was done at that time and this revealed the nodule.  This now shows the nodule to be 15 x 11 x 12 mm with an increased solid component noted since the initial 2021 study.  She also mentions a thyroid  nodule, although no ultrasound has been performed yet. She is under the care of Dr. Susan Ziegler for her primary care needs.  Her medical history includes sleep apnea, for which she previously used a CPAP machine but returned it due to billing issues. She attempted to use an older machine from her niece but is uncertain of its effectiveness. Her sleep is currently described as 'okay'.  Additionally, she has a history of an irregular heartbeat but reports no current heart problems. She is under the care of Dr. Florencio for her cardiac health and takes an 81 mg but no other blood thinner. She quit smoking approximately ten years ago.  No  current shortness of breath or heart problems. She recalls the shortness of breath episode that led to her emergency room visit but has not experienced similar symptoms since.   She was previously followed by Dr. Lavelle Servant but has not seen them since 2024.  Prior PFTs from Dr. Aletha office are as noted below.  She does have a history of rheumatoid arthritis and it was Arnold postulated that the nodule was related to this disease.     DATA Diagnostics: SPIROMETRY: FVC was 2.46 liters, 82% of predicted FEV1 was 1.96, 83% of predicted FEV1 ratio was 78.74 FEF 25-75% liters per second was 77% of predicted   LUNG VOLUMES: TLC was 92% of predicted RV was 122% of predicted   DIFFUSION CAPACITY: DLCO was 100% of predicted DLCO/VA was 125% of predicted   FLOW VOLUME LOOP: Expiratory flow volume looks more restrictive   Impression Spirometry in normal range  Diffusion in normal range TLC is normal range   Interpreting Physician Servant Lavelle BRAVO, MD  Review of Systems A 10 point review of systems was performed and it is as noted above otherwise negative.   Past Medical History:  Diagnosis Date   DDD (degenerative disc disease), lumbar    Depression    Diabetes mellitus without complication (HCC)    Diverticulitis  Fatty liver    Fibroid tumor    GERD (gastroesophageal reflux disease)    Gout    H pylori ulcer    Hemorrhoids    Hypercholesteremia    Hypertension    IBS (irritable bowel syndrome)    IDA (iron deficiency anemia)    OA (osteoarthritis)    Obesity    RA (rheumatoid arthritis) (HCC)    Sleep apnea    Spinal stenosis    Vitamin D  deficiency     Past Surgical History:  Procedure Laterality Date   ABDOMINAL HYSTERECTOMY     partial   BACK SURGERY     CHOLECYSTECTOMY     COLONOSCOPY WITH PROPOFOL  N/A 08/18/2015   Procedure: COLONOSCOPY WITH PROPOFOL ;  Surgeon: Lamar ONEIDA Holmes, MD;  Location: [facility] ENDOSCOPY;  Service: Endoscopy;  Laterality:  N/A;   COLONOSCOPY WITH PROPOFOL  N/A 07/16/2020   Procedure: COLONOSCOPY WITH PROPOFOL ;  Surgeon: Maryruth Ole ONEIDA, MD;  Location: [facility] ENDOSCOPY;  Service: Endoscopy;  Laterality: N/A;   ECTOPIC PREGNANCY SURGERY     ESOPHAGOGASTRODUODENOSCOPY (EGD) WITH PROPOFOL  N/A 08/18/2015   Procedure: ESOPHAGOGASTRODUODENOSCOPY (EGD) WITH PROPOFOL ;  Surgeon: Lamar ONEIDA Holmes, MD;  Location[facility] ENDOSCOPY;  Service: Endoscopy;  Laterality: N/A;   ESOPHAGOGASTRODUODENOSCOPY (EGD) WITH PROPOFOL  N/A 01/17/2022   Procedure: ESOPHAGOGASTRODUODENOSCOPY (EGD) WITH PROPOFOL ;  Surgeon: Maryruth Ole ONEIDA, MD;  Location: [facility] ENDOSCOPY;  Service: Endoscopy;  Laterality: N/A;  DM   FOOT SURGERY     incision tendon sheath for trigger finger     NECK SURGERY     ROTATOR CUFF REPAIR Right    SHOULDER SURGERY Right    SPINE SURGERY     neck x2 and lumbar spine x1   uterine fibroid removed      Patient Active Problem List   Diagnosis Date Noted   Lung nodule 05/20/2024   CHF (congestive heart failure) (HCC) 05/10/2024   Postherpetic neuralgia 04/12/2024   Cardiac murmur, unspecified 12/19/2023   Neck pain 12/19/2023   Trochanteric bursitis of left hip 07/10/2023   Acute idiopathic gout of left hand 06/07/2023   Full thickness rotator cuff tear 06/06/2023   Non-functioning pituitary adenoma (HCC) 02/08/2023   Lateral epicondylitis, left elbow 12/08/2022   Type 2 diabetes mellitus with hyperglycemia, with long-term current use of insulin  (HCC) 08/10/2021   Solitary pulmonary nodule 10/23/2019   Encounter for screening mammogram for malignant neoplasm of breast 07/25/2019   Impingement syndrome of left shoulder 07/18/2016   Hepatic steatosis 07/23/2015   Moderate episode of recurrent major depressive disorder (HCC) 06/03/2015   B12 deficiency 11/05/2014   Gastro-esophageal reflux disease without esophagitis 11/05/2014   Obesity (BMI 30-39.9) 11/05/2014   Vitamin D  deficiency 11/05/2014   Essential (primary)  hypertension 11/05/2014   Diverticulosis of large intestine without perforation or abscess without bleeding 11/05/2014   Varicose veins of bilateral lower extremities with pain 11/05/2014   Allergic rhinitis 11/05/2014   Hyperlipidemia, unspecified 11/05/2014   DDD (degenerative disc disease), lumbosacral 09/18/2014   DDD (degenerative disc disease), cervical 09/18/2014   Diabetic polyneuropathy associated with type 2 diabetes mellitus (HCC) 09/18/2014   Rheumatoid arthritis of multiple sites without rheumatoid factor (HCC) 09/30/2013    Family History  Problem Relation Age of Onset   Diabetes Mother    Diabetes Father    Kidney disease Father    Diabetes Sister    Kidney disease Brother        Dialysis   Stroke Brother    Breast cancer Neg Hx  Social History   Tobacco Use   Smoking status: Former    Current packs/day: 0.00    Average packs/day: 0.5 packs/day for 32.4 years (16.2 ttl pk-yrs)    Types: Cigarettes    Start date: 05/15/1972    Quit date: 09/20/2004    Years since quitting: 19.6    Passive exposure: Current   Smokeless tobacco: Never  Substance Use Topics   Alcohol use: No    Alcohol/week: 0.0 standard drinks of alcohol    Allergies[1]  Active Medications[2]  Immunization History  Administered Date(s) Administered   Hep A / Hep B 07/28/2015, 09/21/2015, 09/28/2023   INFLUENZA, HIGH DOSE SEASONAL PF 02/06/2023, 03/12/2024   Influenza, Seasonal, Injecte, Preservative Fre 01/24/2010, 02/08/2012   Influenza,inj,Quad PF,6+ Mos 01/16/2013, 04/17/2014, 03/04/2015, 03/21/2016, 04/09/2017, 06/11/2018, 01/17/2019, 02/23/2020, 02/07/2021   Influenza-Unspecified 04/17/2014, 03/04/2015, 04/09/2017, 06/11/2018, 01/17/2019, 02/07/2022   PFIZER Comirnaty(Gray Top)Covid-19 Tri-Sucrose Vaccine 08/06/2019, 08/27/2019, 01/12/2020   PNEUMOCOCCAL CONJUGATE-20 02/07/2022   Pneumococcal Conjugate-13 12/21/2009, 08/06/2014   Pneumococcal Polysaccharide-23 12/21/2009   Td  12/21/2009   Tdap 12/21/2009, 02/20/2022   Zoster Recombinant(Shingrix) 07/09/2018, 01/17/2019, 02/06/2023   Zoster, Live 08/06/2014        Objective:     Vitals:   05/20/24 0905  BP: 110/80  Pulse: 78  Temp: 97.6 F (36.4 C)  Height: 5' 6 (1.676 m)  Weight: 216 lb 9.6 oz (98.2 kg)  SpO2: 97%  TempSrc: Temporal  BMI (Calculated): 34.98     SpO2: 97 %  GENERAL: Obese woman, no acute distress, fully ambulatory, no conversational dyspnea.   HEAD: Normocephalic, atraumatic.  EYES: Pupils equal, round, reactive to light.  No scleral icterus.  MOUTH: Upper dentures, poor dentition, oral mucosa moist.  No thrush. NECK: Supple. No thyromegaly. Trachea midline. No JVD.  No adenopathy. PULMONARY: Good air entry bilaterally.  Coarse, otherwise, no adventitious sounds. CARDIOVASCULAR: S1 and S2. Regular rate and rhythm.  No rubs, murmurs or gallops heard. ABDOMEN: Obese, otherwise benign. MUSCULOSKELETAL: No joint deformity, no clubbing, no edema.  NEUROLOGIC: No overt focal deficit, no gait disturbance, speech is fluent. SKIN: Intact,warm,dry. PSYCH: Mood and behavior normal.  Representative image from the CT performed 27 April 2024 showing the right upper lobe nodule in question (arrow):    Assessment & Plan:     ICD-10-CM   1. Lung nodule  R91.1 Procedural/ Surgical Case Request: VIDEO BRONCHOSCOPY WITH ENDOBRONCHIAL NAVIGATION, ENDOBRONCHIAL ULTRASOUND (EBUS)    CT SUPER D CHEST WO MONARCH PILOT    2. OSA (obstructive sleep apnea)  G47.33       Orders Placed This Encounter  Procedures   Procedural/ Surgical Case Request: VIDEO BRONCHOSCOPY WITH ENDOBRONCHIAL NAVIGATION, ENDOBRONCHIAL ULTRASOUND (EBUS)    Standing Status:   Future    Expiration Date:   05/20/2025    Pre-op diagnosis:   RIGHT upper lobe lung nodule    Special needs:   CBCT(fluoro) REBUS,EBUS, [employer]   CT SUPER D CHEST WO MONARCH PILOT    Standing Status:   Future    Expiration Date:   05/20/2025     Scheduling Instructions:     Please do before 26 May 2024.  Patient has robotic assisted navigational bronchoscopy on 12 January.    Preferred imaging location?:   [facility]   Discussion:    Solitary pulmonary nodule of the right upper lobe The nodule is suspected to be growing, necessitating further evaluation. Differential diagnosis includes rheumatoid arthritis-related inflammation or a slow-growing tumor (indolent adenocarcinoma). Biopsy is recommended to determine  the nature of the nodule. Risks of biopsy include a 1-3% chance of lung collapse, requiring overnight stay if it occurs. The procedure will be performed under general anesthesia using robotic assistance for precision. - Scheduled bronchoscopy with robotic assistance for biopsy on January 12th, 2026. - Ordered Monarch protocol CT for mapping purposes for the procedure. - Hold aspirin  at least 3 days prior to the procedure - Will follow up in 3-4 weeks post-procedure.  Obstructive sleep apnea Management is complicated by issues with the CPAP machine provider. She is currently not using a CPAP machine. Sleep apnea is a concern due to potential complications with anesthesia during the biopsy procedure. - Will explore options for a new CPAP machine provider.      Advised if symptoms do not improve or worsen, to please contact office for sooner follow up or seek emergency care.    I spent 45 minutes of dedicated to the care of this patient on the date of this encounter to include pre-visit review of records, face-to-face time with the patient discussing conditions above, post visit ordering of testing, clinical documentation with the electronic health record, making appropriate referrals as documented, and communicating necessary findings to members of the patients care team.   C. Leita Sanders, MD Advanced Bronchoscopy PCCM [facility]    *This note was dictated using voice recognition  software/Dragon.  Despite best efforts to proofread, errors can occur which can change the meaning. Any transcriptional errors that result from this process are unintentional and may not be fully corrected at the time of dictation.     [1]  Allergies Allergen Reactions   Lisinopril-Hydrochlorothiazide  Shortness Of Breath   Liraglutide Other (See Comments)    H/o pancreatitis   Pregabalin Swelling    Hand swelling   Sitagliptin Other (See Comments)    Hx of pancreatitis   Sulfadiazine Hives   Ace Inhibitors Rash and Other (See Comments)    Other reaction(s): Unknown  Pt can't remember what her reaction is and she can't remember what the name of the BP med  Other reaction(s): Unknown  Pt can't remember what her reaction is and she can't remember what the name of the BP med   Other reaction(s): Unknown  Other reaction(s): Unknown  Pt can't remember what her reaction is and she can't remember what the name of the BP med   Other reaction(s): Unknown  Pt can't remember what her reaction is and she can't remember what the name of the BP med   Other reaction(s): Unknown  Other reaction(s): Unknown  Pt can't remember what her reaction is and she can't remember what the name of the BP med   Other reaction(s): Unknown  Pt can't remember what her reaction is and she can't remember what the name of the BP med   Other reaction(s): Unknown, Pt can't remember what her reaction is and she can't remember what the name of the BP med , Other reaction(s): Unknown, Other reaction(s): Unknown, Pt can't remember what her reaction is and she can't remember what the name of the BP med , Other reaction(s): Unknown, Pt can't remember what her reaction is and she can't remember what the name of the BP med   Sulfa Antibiotics Other (See Comments), Rash and Dermatitis  [2]  Current Meds  Medication Sig   albuterol  (VENTOLIN  HFA) 108 (90 Base) MCG/ACT inhaler Inhale 2 puffs into the lungs every 6 (six) hours  as needed for wheezing or shortness of breath.  allopurinol  (ZYLOPRIM ) 100 MG tablet Take 200 mg by mouth daily.   amLODipine  (NORVASC ) 10 MG tablet TAKE 1 TABLET(10 MG TOTAL) BY MOUTH DAILY.   aspirin  81 MG chewable tablet Chew by mouth daily.   atorvastatin  (LIPITOR) 80 MG tablet Take 1 tablet (80 mg total) by mouth daily.   Blood Glucose Monitoring Suppl (ONE TOUCH ULTRA MINI) w/Device KIT Use to check blood sugars three times a day   carvedilol  (COREG ) 6.25 MG tablet Take 1 tablet (6.25 mg total) by mouth 2 (two) times daily with a meal.   cetirizine  (ZYRTEC ) 10 MG tablet Take 1 tablet (10 mg total) by mouth daily.   Continuous Glucose Receiver (FREESTYLE LIBRE 2 READER) DEVI Place 1 each onto the skin See admin instructions.   Continuous Glucose Sensor (FREESTYLE LIBRE 2 SENSOR) MISC Place 1 each onto the skin every 14 (fourteen) days.   diazepam  (VALIUM ) 5 MG tablet Take 1 tablet (5 mg total) by mouth every 12 (twelve) hours as needed for anxiety.   diclofenac Sodium (VOLTAREN) 1 % GEL Apply topically 4 (four) times daily.   ezetimibe  (ZETIA ) 10 MG tablet Take 1 tablet (10 mg total) by mouth daily.   fluticasone  (FLONASE ) 50 MCG/ACT nasal spray Place 2 sprays into both nostrils as needed for allergies.   furosemide  (LASIX ) 20 MG tablet If weight is 218 or higher, Take one po q day until weight back to 216 or lower.   gabapentin  (NEURONTIN ) 300 MG capsule Take 1 capsule (300 mg total) by mouth 4 (four) times daily. (Patient taking differently: Take 300 mg by mouth 3 (three) times daily.)   glucose blood (PRECISION QID TEST) test strip Use as instructed   glucose blood (TRUE METRIX BLOOD GLUCOSE TEST) test strip 1 each by Other route in the morning, at noon, and at bedtime. Use as instructed   HYDROcodone -acetaminophen  (NORCO) 7.5-325 MG tablet Take 1 tablet by mouth every 6 (six) hours as needed.   hydroxypropyl methylcellulose / hypromellose (ISOPTO TEARS / GONIOVISC) 2.5 % ophthalmic  solution Place 1 drop into the right eye every hour as needed for dry eyes.   Insulin  Glargine (BASAGLAR KWIKPEN) 100 UNIT/ML Inject 68 units AM and 75 units PM   insulin  lispro (HUMALOG) 100 UNIT/ML KwikPen Take 30 units with meals plus correction as instructed. Max TDD 150 units.   KLOR-CON  M20 20 MEQ tablet TAKE 1 BY MOUTH ONCE DAILY   leflunomide (ARAVA) 20 MG tablet Take 20 mg by mouth daily.   losartan -hydrochlorothiazide  (HYZAAR) 100-25 MG tablet Take 1 tablet by mouth daily.   meclizine  (ANTIVERT ) 25 MG tablet Take 1 tablet (25 mg total) by mouth 3 (three) times daily as needed for dizziness.   metFORMIN  (GLUCOPHAGE -XR) 500 MG 24 hr tablet Take 2 tablets (1,000 mg total) by mouth 2 (two) times daily with a meal.   naloxone (NARCAN) nasal spray 4 mg/0.1 mL    Omega-3 Fatty Acids (FISH OIL) 300 MG CAPS    omeprazole  (PRILOSEC) 40 MG capsule Take 1 capsule (40 mg total) by mouth daily.   ondansetron  (ZOFRAN -ODT) 4 MG disintegrating tablet Take 1 tablet (4 mg total) by mouth every 6 (six) hours as needed for nausea or vomiting.   polyethylene glycol (MIRALAX ) packet Take 17 g by mouth daily.   prednisoLONE acetate (PRED FORTE) 1 % ophthalmic suspension Place 1 drop into the right eye 2 (two) times daily.   senna-docusate (SENOKOT-S) 8.6-50 MG tablet Take 1 tablet by mouth 2 (two) times daily. (Patient  taking differently: Take 1 tablet by mouth as needed.)   sertraline  (ZOLOFT ) 25 MG tablet Take 1 tablet by mouth daily.   spironolactone  (ALDACTONE ) 25 MG tablet Take 1 tablet by mouth once daily   sucralfate  (CARAFATE ) 1 G tablet Take 1 tablet by mouth 4 (four) times daily. Reported on 11/11/2015   valACYclovir (VALTREX) 1000 MG tablet Take 1,000 mg by mouth 2 (two) times daily.   [DISCONTINUED] neomycin -polymyxin b-dexamethasone  (MAXITROL) 3.5-10000-0.1 SUSP Place 1 drop into the right eye every 6 (six) hours.   "

## 2024-05-20 NOTE — H&P (View-Only) (Signed)
 "  Subjective:    Patient ID: Charlotte [name], female    DOB: 08-Sep-1955, 69 y.o.   MRN: [id number]  Patient Care Team: Ziglar, Susan K, MD as PCP - General (Family Medicine)  Chief Complaint  Patient presents with   Consult    Occasional dry cough.     BACKGROUND: Patient is a 69 year old former smoker (16 PY) and a history as noted below, who presents for evaluation of a right upper lobe nodule noted on CT chest 27 April 2024.  She is kindly referred by Dr. Oneil Budge, her primary care physician is Susan Ziglar.   HPI Discussed the use of AI scribe software for clinical note transcription with the patient, who gave verbal consent to proceed.  History of Present Illness   Charlotte [name] is a 69 year old female who presents for evaluation of a lung nodule on the right upper lobe.  She has a lung nodule on the right upper lobe, which was initially noted for March 2021 CT chest measuring 10 x 9 mm at that time.  Subsequently a CT chest abdomen and pelvis on 20 November 2022 showed the nodule to be 1.5 cm.  She had a visit to the ED on 14 December and a chest CTA was done at that time and this revealed the nodule.  This now shows the nodule to be 15 x 11 x 12 mm with an increased solid component noted since the initial 2021 study.  She also mentions a thyroid  nodule, although no ultrasound has been performed yet. She is under the care of Dr. Susan Ziegler for her primary care needs.  Her medical history includes sleep apnea, for which she previously used a CPAP machine but returned it due to billing issues. She attempted to use an older machine from her niece but is uncertain of its effectiveness. Her sleep is currently described as 'okay'.  Additionally, she has a history of an irregular heartbeat but reports no current heart problems. She is under the care of Dr. Florencio for her cardiac health and takes an 81 mg but no other blood thinner. She quit smoking approximately ten years ago.  No  current shortness of breath or heart problems. She recalls the shortness of breath episode that led to her emergency room visit but has not experienced similar symptoms since.   She was previously followed by Dr. Lavelle Servant but has not seen them since 2024.  Prior PFTs from Dr. Aletha office are as noted below.  She does have a history of rheumatoid arthritis and it was Arnold postulated that the nodule was related to this disease.     DATA Diagnostics: SPIROMETRY: FVC was 2.46 liters, 82% of predicted FEV1 was 1.96, 83% of predicted FEV1 ratio was 78.74 FEF 25-75% liters per second was 77% of predicted   LUNG VOLUMES: TLC was 92% of predicted RV was 122% of predicted   DIFFUSION CAPACITY: DLCO was 100% of predicted DLCO/VA was 125% of predicted   FLOW VOLUME LOOP: Expiratory flow volume looks more restrictive   Impression Spirometry in normal range  Diffusion in normal range TLC is normal range   Interpreting Physician Servant Lavelle BRAVO, MD  Review of Systems A 10 point review of systems was performed and it is as noted above otherwise negative.   Past Medical History:  Diagnosis Date   DDD (degenerative disc disease), lumbar    Depression    Diabetes mellitus without complication (HCC)    Diverticulitis  Fatty liver    Fibroid tumor    GERD (gastroesophageal reflux disease)    Gout    H pylori ulcer    Hemorrhoids    Hypercholesteremia    Hypertension    IBS (irritable bowel syndrome)    IDA (iron deficiency anemia)    OA (osteoarthritis)    Obesity    RA (rheumatoid arthritis) (HCC)    Sleep apnea    Spinal stenosis    Vitamin D  deficiency     Past Surgical History:  Procedure Laterality Date   ABDOMINAL HYSTERECTOMY     partial   BACK SURGERY     CHOLECYSTECTOMY     COLONOSCOPY WITH PROPOFOL  N/A 08/18/2015   Procedure: COLONOSCOPY WITH PROPOFOL ;  Surgeon: Lamar ONEIDA Holmes, MD;  Location: [facility] ENDOSCOPY;  Service: Endoscopy;  Laterality:  N/A;   COLONOSCOPY WITH PROPOFOL  N/A 07/16/2020   Procedure: COLONOSCOPY WITH PROPOFOL ;  Surgeon: Maryruth Ole ONEIDA, MD;  Location: [facility] ENDOSCOPY;  Service: Endoscopy;  Laterality: N/A;   ECTOPIC PREGNANCY SURGERY     ESOPHAGOGASTRODUODENOSCOPY (EGD) WITH PROPOFOL  N/A 08/18/2015   Procedure: ESOPHAGOGASTRODUODENOSCOPY (EGD) WITH PROPOFOL ;  Surgeon: Lamar ONEIDA Holmes, MD;  Location: [facility] ENDOSCOPY;  Service: Endoscopy;  Laterality: N/A;   ESOPHAGOGASTRODUODENOSCOPY (EGD) WITH PROPOFOL  N/A 01/17/2022   Procedure: ESOPHAGOGASTRODUODENOSCOPY (EGD) WITH PROPOFOL ;  Surgeon: Maryruth Ole ONEIDA, MD;  Location: [facility] ENDOSCOPY;  Service: Endoscopy;  Laterality: N/A;  DM   FOOT SURGERY     incision tendon sheath for trigger finger     NECK SURGERY     ROTATOR CUFF REPAIR Right    SHOULDER SURGERY Right    SPINE SURGERY     neck x2 and lumbar spine x1   uterine fibroid removed      Patient Active Problem List   Diagnosis Date Noted   Lung nodule 05/20/2024   CHF (congestive heart failure) (HCC) 05/10/2024   Postherpetic neuralgia 04/12/2024   Cardiac murmur, unspecified 12/19/2023   Neck pain 12/19/2023   Trochanteric bursitis of left hip 07/10/2023   Acute idiopathic gout of left hand 06/07/2023   Full thickness rotator cuff tear 06/06/2023   Non-functioning pituitary adenoma (HCC) 02/08/2023   Lateral epicondylitis, left elbow 12/08/2022   Type 2 diabetes mellitus with hyperglycemia, with long-term current use of insulin  (HCC) 08/10/2021   Solitary pulmonary nodule 10/23/2019   Encounter for screening mammogram for malignant neoplasm of breast 07/25/2019   Impingement syndrome of left shoulder 07/18/2016   Hepatic steatosis 07/23/2015   Moderate episode of recurrent major depressive disorder (HCC) 06/03/2015   B12 deficiency 11/05/2014   Gastro-esophageal reflux disease without esophagitis 11/05/2014   Obesity (BMI 30-39.9) 11/05/2014   Vitamin D  deficiency 11/05/2014   Essential (primary)  hypertension 11/05/2014   Diverticulosis of large intestine without perforation or abscess without bleeding 11/05/2014   Varicose veins of bilateral lower extremities with pain 11/05/2014   Allergic rhinitis 11/05/2014   Hyperlipidemia, unspecified 11/05/2014   DDD (degenerative disc disease), lumbosacral 09/18/2014   DDD (degenerative disc disease), cervical 09/18/2014   Diabetic polyneuropathy associated with type 2 diabetes mellitus (HCC) 09/18/2014   Rheumatoid arthritis of multiple sites without rheumatoid factor (HCC) 09/30/2013    Family History  Problem Relation Age of Onset   Diabetes Mother    Diabetes Father    Kidney disease Father    Diabetes Sister    Kidney disease Brother        Dialysis   Stroke Brother    Breast cancer Neg Hx  Social History   Tobacco Use   Smoking status: Former    Current packs/day: 0.00    Average packs/day: 0.5 packs/day for 32.4 years (16.2 ttl pk-yrs)    Types: Cigarettes    Start date: 05/15/1972    Quit date: 09/20/2004    Years since quitting: 19.6    Passive exposure: Current   Smokeless tobacco: Never  Substance Use Topics   Alcohol use: No    Alcohol/week: 0.0 standard drinks of alcohol    Allergies[1]  Active Medications[2]  Immunization History  Administered Date(s) Administered   Hep A / Hep B 07/28/2015, 09/21/2015, 09/28/2023   INFLUENZA, HIGH DOSE SEASONAL PF 02/06/2023, 03/12/2024   Influenza, Seasonal, Injecte, Preservative Fre 01/24/2010, 02/08/2012   Influenza,inj,Quad PF,6+ Mos 01/16/2013, 04/17/2014, 03/04/2015, 03/21/2016, 04/09/2017, 06/11/2018, 01/17/2019, 02/23/2020, 02/07/2021   Influenza-Unspecified 04/17/2014, 03/04/2015, 04/09/2017, 06/11/2018, 01/17/2019, 02/07/2022   PFIZER Comirnaty(Gray Top)Covid-19 Tri-Sucrose Vaccine 08/06/2019, 08/27/2019, 01/12/2020   PNEUMOCOCCAL CONJUGATE-20 02/07/2022   Pneumococcal Conjugate-13 12/21/2009, 08/06/2014   Pneumococcal Polysaccharide-23 12/21/2009   Td  12/21/2009   Tdap 12/21/2009, 02/20/2022   Zoster Recombinant(Shingrix) 07/09/2018, 01/17/2019, 02/06/2023   Zoster, Live 08/06/2014        Objective:     Vitals:   05/20/24 0905  BP: 110/80  Pulse: 78  Temp: 97.6 F (36.4 C)  Height: 5' 6 (1.676 m)  Weight: 216 lb 9.6 oz (98.2 kg)  SpO2: 97%  TempSrc: Temporal  BMI (Calculated): 34.98     SpO2: 97 %  GENERAL: Obese woman, no acute distress, fully ambulatory, no conversational dyspnea.   HEAD: Normocephalic, atraumatic.  EYES: Pupils equal, round, reactive to light.  No scleral icterus.  MOUTH: Upper dentures, poor dentition, oral mucosa moist.  No thrush. NECK: Supple. No thyromegaly. Trachea midline. No JVD.  No adenopathy. PULMONARY: Good air entry bilaterally.  Coarse, otherwise, no adventitious sounds. CARDIOVASCULAR: S1 and S2. Regular rate and rhythm.  No rubs, murmurs or gallops heard. ABDOMEN: Obese, otherwise benign. MUSCULOSKELETAL: No joint deformity, no clubbing, no edema.  NEUROLOGIC: No overt focal deficit, no gait disturbance, speech is fluent. SKIN: Intact,warm,dry. PSYCH: Mood and behavior normal.  Representative image from the CT performed 27 April 2024 showing the right upper lobe nodule in question (arrow):    Assessment & Plan:     ICD-10-CM   1. Lung nodule  R91.1 Procedural/ Surgical Case Request: VIDEO BRONCHOSCOPY WITH ENDOBRONCHIAL NAVIGATION, ENDOBRONCHIAL ULTRASOUND (EBUS)    CT SUPER D CHEST WO MONARCH PILOT    2. OSA (obstructive sleep apnea)  G47.33       Orders Placed This Encounter  Procedures   Procedural/ Surgical Case Request: VIDEO BRONCHOSCOPY WITH ENDOBRONCHIAL NAVIGATION, ENDOBRONCHIAL ULTRASOUND (EBUS)    Standing Status:   Future    Expiration Date:   05/20/2025    Pre-op diagnosis:   RIGHT upper lobe lung nodule    Special needs:   CBCT(fluoro) REBUS,EBUS, [employer]   CT SUPER D CHEST WO MONARCH PILOT    Standing Status:   Future    Expiration Date:   05/20/2025     Scheduling Instructions:     Please do before 26 May 2024.  Patient has robotic assisted navigational bronchoscopy on 12 January.    Preferred imaging location?:   [facility]   Discussion:    Solitary pulmonary nodule of the right upper lobe The nodule is suspected to be growing, necessitating further evaluation. Differential diagnosis includes rheumatoid arthritis-related inflammation or a slow-growing tumor (indolent adenocarcinoma). Biopsy is recommended to determine  the nature of the nodule. Risks of biopsy include a 1-3% chance of lung collapse, requiring overnight stay if it occurs. The procedure will be performed under general anesthesia using robotic assistance for precision. - Scheduled bronchoscopy with robotic assistance for biopsy on January 12th, 2026. - Ordered Monarch protocol CT for mapping purposes for the procedure. - Hold aspirin  at least 3 days prior to the procedure - Will follow up in 3-4 weeks post-procedure.  Obstructive sleep apnea Management is complicated by issues with the CPAP machine provider. She is currently not using a CPAP machine. Sleep apnea is a concern due to potential complications with anesthesia during the biopsy procedure. - Will explore options for a new CPAP machine provider.      Advised if symptoms do not improve or worsen, to please contact office for sooner follow up or seek emergency care.    I spent xxx minutes of dedicated to the care of this patient on the date of this encounter to include pre-visit review of records, face-to-face time with the patient discussing conditions above, post visit ordering of testing, clinical documentation with the electronic health record, making appropriate referrals as documented, and communicating necessary findings to members of the patients care team.   C. Leita Sanders, MD Advanced Bronchoscopy P[facility]  [facility]    *This note was dictated using voice recognition  software/Dragon.  Despite best efforts to proofread, errors can occur which can change the meaning. Any transcriptional errors that result from this process are unintentional and may not be fully corrected at the time of dictation.    [1]  Allergies Allergen Reactions   Lisinopril-Hydrochlorothiazide  Shortness Of Breath   Liraglutide Other (See Comments)    H/o pancreatitis   Pregabalin Swelling    Hand swelling   Sitagliptin Other (See Comments)    Hx of pancreatitis   Sulfadiazine Hives   Ace Inhibitors Rash and Other (See Comments)    Other reaction(s): Unknown  Pt can't remember what her reaction is and she can't remember what the name of the BP med  Other reaction(s): Unknown  Pt can't remember what her reaction is and she can't remember what the name of the BP med   Other reaction(s): Unknown  Other reaction(s): Unknown  Pt can't remember what her reaction is and she can't remember what the name of the BP med   Other reaction(s): Unknown  Pt can't remember what her reaction is and she can't remember what the name of the BP med   Other reaction(s): Unknown  Other reaction(s): Unknown  Pt can't remember what her reaction is and she can't remember what the name of the BP med   Other reaction(s): Unknown  Pt can't remember what her reaction is and she can't remember what the name of the BP med   Other reaction(s): Unknown, Pt can't remember what her reaction is and she can't remember what the name of the BP med , Other reaction(s): Unknown, Other reaction(s): Unknown, Pt can't remember what her reaction is and she can't remember what the name of the BP med , Other reaction(s): Unknown, Pt can't remember what her reaction is and she can't remember what the name of the BP med   Sulfa Antibiotics Other (See Comments), Rash and Dermatitis  [2]  Current Meds  Medication Sig   albuterol  (VENTOLIN  HFA) 108 (90 Base) MCG/ACT inhaler Inhale 2 puffs into the lungs every 6 (six) hours as  needed for wheezing or shortness of breath.   allopurinol  (  ZYLOPRIM ) 100 MG tablet Take 200 mg by mouth daily.   amLODipine  (NORVASC ) 10 MG tablet TAKE 1 TABLET(10 MG TOTAL) BY MOUTH DAILY.   aspirin  81 MG chewable tablet Chew by mouth daily.   atorvastatin  (LIPITOR) 80 MG tablet Take 1 tablet (80 mg total) by mouth daily.   Blood Glucose Monitoring Suppl (ONE TOUCH ULTRA MINI) w/Device KIT Use to check blood sugars three times a day   carvedilol  (COREG ) 6.25 MG tablet Take 1 tablet (6.25 mg total) by mouth 2 (two) times daily with a meal.   cetirizine  (ZYRTEC ) 10 MG tablet Take 1 tablet (10 mg total) by mouth daily.   Continuous Glucose Receiver (FREESTYLE LIBRE 2 READER) DEVI Place 1 each onto the skin See admin instructions.   Continuous Glucose Sensor (FREESTYLE LIBRE 2 SENSOR) MISC Place 1 each onto the skin every 14 (fourteen) days.   diazepam  (VALIUM ) 5 MG tablet Take 1 tablet (5 mg total) by mouth every 12 (twelve) hours as needed for anxiety.   diclofenac Sodium (VOLTAREN) 1 % GEL Apply topically 4 (four) times daily.   ezetimibe  (ZETIA ) 10 MG tablet Take 1 tablet (10 mg total) by mouth daily.   fluticasone  (FLONASE ) 50 MCG/ACT nasal spray Place 2 sprays into both nostrils as needed for allergies.   furosemide  (LASIX ) 20 MG tablet If weight is 218 or higher, Take one po q day until weight back to 216 or lower.   gabapentin  (NEURONTIN ) 300 MG capsule Take 1 capsule (300 mg total) by mouth 4 (four) times daily. (Patient taking differently: Take 300 mg by mouth 3 (three) times daily.)   glucose blood (PRECISION QID TEST) test strip Use as instructed   glucose blood (TRUE METRIX BLOOD GLUCOSE TEST) test strip 1 each by Other route in the morning, at noon, and at bedtime. Use as instructed   HYDROcodone -acetaminophen  (NORCO) 7.5-325 MG tablet Take 1 tablet by mouth every 6 (six) hours as needed.   hydroxypropyl methylcellulose / hypromellose (ISOPTO TEARS / GONIOVISC) 2.5 % ophthalmic solution  Place 1 drop into the right eye every hour as needed for dry eyes.   Insulin  Glargine (BASAGLAR KWIKPEN) 100 UNIT/ML Inject 68 units AM and 75 units PM   insulin  lispro (HUMALOG) 100 UNIT/ML KwikPen Take 30 units with meals plus correction as instructed. Max TDD 150 units.   KLOR-CON  M20 20 MEQ tablet TAKE 1 BY MOUTH ONCE DAILY   leflunomide (ARAVA) 20 MG tablet Take 20 mg by mouth daily.   losartan -hydrochlorothiazide  (HYZAAR) 100-25 MG tablet Take 1 tablet by mouth daily.   meclizine  (ANTIVERT ) 25 MG tablet Take 1 tablet (25 mg total) by mouth 3 (three) times daily as needed for dizziness.   metFORMIN  (GLUCOPHAGE -XR) 500 MG 24 hr tablet Take 2 tablets (1,000 mg total) by mouth 2 (two) times daily with a meal.   naloxone (NARCAN) nasal spray 4 mg/0.1 mL    Omega-3 Fatty Acids (FISH OIL) 300 MG CAPS    omeprazole  (PRILOSEC) 40 MG capsule Take 1 capsule (40 mg total) by mouth daily.   ondansetron  (ZOFRAN -ODT) 4 MG disintegrating tablet Take 1 tablet (4 mg total) by mouth every 6 (six) hours as needed for nausea or vomiting.   polyethylene glycol (MIRALAX ) packet Take 17 g by mouth daily.   prednisoLONE acetate (PRED FORTE) 1 % ophthalmic suspension Place 1 drop into the right eye 2 (two) times daily.   senna-docusate (SENOKOT-S) 8.6-50 MG tablet Take 1 tablet by mouth 2 (two) times daily. (Patient taking  differently: Take 1 tablet by mouth as needed.)   sertraline  (ZOLOFT ) 25 MG tablet Take 1 tablet by mouth daily.   spironolactone  (ALDACTONE ) 25 MG tablet Take 1 tablet by mouth once daily   sucralfate  (CARAFATE ) 1 G tablet Take 1 tablet by mouth 4 (four) times daily. Reported on 11/11/2015   valACYclovir (VALTREX) 1000 MG tablet Take 1,000 mg by mouth 2 (two) times daily.   [DISCONTINUED] neomycin -polymyxin b-dexamethasone  (MAXITROL) 3.5-10000-0.1 SUSP Place 1 drop into the right eye every 6 (six) hours.   "

## 2024-05-20 NOTE — Patient Instructions (Addendum)
 VISIT SUMMARY:  Today, we discussed the evaluation of a lung nodule in your right upper lobe, which was initially discovered during an emergency room visit for shortness of breath. We also reviewed your history of sleep apnea and the issues with your CPAP machine. Additionally, we touched on your thyroid  nodule and irregular heartbeat, although no immediate concerns were noted for these conditions.  YOUR PLAN:  -SOLITARY PULMONARY NODULE OF THE RIGHT UPPER LOBE: A solitary pulmonary nodule is a small, round growth in the lung that can be benign or malignant. We suspect that your nodule may be growing, so a biopsy is necessary to determine its nature. The biopsy will be performed using robotic assistance under general anesthesia, and there is a small risk of lung collapse. You are scheduled for a bronchoscopy with robotic assistance for biopsy on January 12th, 2026. A scan without dye will be done to map your airway for the procedure. We will follow up in 3-4 weeks after the procedure.  -OBSTRUCTIVE SLEEP APNEA: Obstructive sleep apnea is a condition where your breathing repeatedly stops and starts during sleep. This can complicate anesthesia during your biopsy. We will explore options for a new CPAP machine provider to ensure you have the necessary equipment to manage your sleep apnea.  INSTRUCTIONS:  You are scheduled for a bronchoscopy with robotic assistance for biopsy on January 12th, 2026. Please complete the scan without dye to map your airway before the procedure. Follow up with us  in 3-4 weeks post-procedure to discuss the results and next steps.   ADDITIONAL NOTES:  We discussed that the procedure would have to be done under general anesthesia. The anesthesia team will discuss his part of the process with you.  Complications from the procedure itself are usually minor. One potential complication would be collapse of the lung which can occur in 1-3% of the cases. If  this happens we would  have to put a small tube to relieve the collapse and you would have to spend the night in the hospital in that event.  Another possibility would be that of bleeding, this is usually taken care of during the procedure.  In the situations you may need to be observed overnight.  For the most part though, should be able to go home the same day.  Other possibilities could include that the procedure would be nondiagnostic meaning that no definitive diagnosis could be attained.  We strive to try to decrease these potential issues.

## 2024-05-21 ENCOUNTER — Other Ambulatory Visit: Payer: Self-pay

## 2024-05-21 ENCOUNTER — Encounter
Admission: RE | Admit: 2024-05-21 | Discharge: 2024-05-21 | Disposition: A | Discharge status: Home / Self Care | Source: Ambulatory Visit | Attending: Pulmonary Disease | Admitting: Pulmonary Disease

## 2024-05-21 VITALS — Ht 66.0 in | Wt 216.9 lb

## 2024-05-21 DIAGNOSIS — E1165 Type 2 diabetes mellitus with hyperglycemia: Secondary | ICD-10-CM

## 2024-05-21 DIAGNOSIS — Z01812 Encounter for preprocedural laboratory examination: Secondary | ICD-10-CM

## 2024-05-21 NOTE — Patient Instructions (Addendum)
 Your procedure is scheduled on:   MONDAY JANUARY 12 Report to the Registration Desk on the 1st floor of the [facility] To find out your arrival time, please call [phone] between 1PM - 3PM on:  FRIDAY JANUARY 9  If your arrival time is 6:00 am, do not arrive before that time as the [facility] entrance doors do not open until 6:00 am.  REMEMBER: Instructions that are not followed completely may result in serious medical risk, up to and including death; or upon the discretion of your surgeon and anesthesiologist your surgery may need to be rescheduled.  Do not eat food after midnight the night before surgery.  No gum chewing or hard candies.  You may however, drink WATER up to 2 hours before you are scheduled to arrive for your surgery. Do not drink anything within 2 hours of your scheduled arrival time.  Clear liquids include: - water  - apple juice without pulp - gatorade (not RED colors) - black coffee or tea (Do NOT add milk or creamers to the coffee or tea) Do NOT drink anything that is not on this list.   One week prior to surgery: Stop Anti-inflammatories (NSAIDS) such as Advil, Aleve, Ibuprofen, Motrin, Naproxen, Naprosyn and Aspirin  based products such as Excedrin, Goody's Powder, BC Powder. Stop ANY OVER THE COUNTER supplements until after surgery. cetirizine  (ZYRTEC )  fluticasone  (FLONASE )  Omega-3 Fatty Acids (FISH OIL)   You may however, continue to take Tylenol  if needed for pain up until the day of surgery.  **Follow guidelines for insulin  and diabetes medications.** Insulin  Glargine (BASAGLAR KWIKPEN) take 38 units the night before your surgery ( you are taking 1/2 of your normal dose) Do not take any insulin  the morning of surgery.  insulin  lispro (HUMALOG) take 15 units with correction for evening dose ( You are taking 1/2 of your normal dose) Do not take any insulin  the morning of surgery  metFORMIN  (GLUCOPHAGE -XR) hold 2 days prior to surgery, last dose  SATURDAY JANUARY 10   Continue taking all of your other prescription medications up until the day of surgery.  ON THE DAY OF SURGERY ONLY TAKE THESE MEDICATIONS WITH SIPS OF WATER:  omeprazole  (PRILOSEC)   Use inhalers on the day of surgery and bring to the hospital. albuterol  (VENTOLIN  HFA)   Do not use any recreational drugs for at least a week (preferably 2 weeks) before your surgery.  Please be advised that the combination of cocaine and anesthesia may have negative outcomes, up to and including death. If you test positive for cocaine, your surgery will be cancelled.  On the morning of surgery brush your teeth with toothpaste and water, you may rinse your mouth with mouthwash if you wish. Do not swallow any toothpaste or mouthwash.  Do not wear jewelry, make-up, hairpins, clips or nail polish.  For welded (permanent) jewelry: bracelets, anklets, waist bands, etc.  Please have this removed prior to surgery.  If it is not removed, there is a chance that hospital personnel will need to cut it off on the day of surgery.  Do not wear lotions, powders, or perfumes.   Do not shave body hair from the neck down 48 hours before surgery.  Contact lenses, hearing aids and dentures may not be worn into surgery.  Do not bring valuables to the hospital. [facility] is not responsible for any missing/lost belongings or valuables.   Notify your doctor if there is any change in your medical condition (cold, fever,  infection).  Wear comfortable clothing (specific to your surgery type) to the hospital.  After surgery, you can help prevent lung complications by doing breathing exercises.  Take deep breaths and cough every 1-2 hours.    If you are being discharged the day of surgery, you will not be allowed to drive home. You will need a responsible individual to drive you home and stay with you for 24 hours after surgery.   If you are taking public transportation, you will need to have a  responsible individual with you.  Please call the Pre-admissions Testing Dept. at [phone] if you have any questions about these instructions.  Surgery Visitation Policy:  Patients having surgery or a procedure may have two visitors.  Children under the age of 20 must have an adult with them who is not the patient.  Merchandiser, Retail to address health-related social needs:  [email or web address]

## 2024-05-22 ENCOUNTER — Telehealth: Payer: Self-pay | Admitting: Family Medicine

## 2024-05-22 NOTE — Telephone Encounter (Signed)
 Copied from CRM #[id number]. Topic: Clinical - Medical Advice >> May 22, 2024 10:25 AM Viola FALCON wrote: Reason for CRM: Patient called to let Dr. Onita know she's having A CT Scan done 05/23/24 and a bx of lung nodule surgery 05/26/24.

## 2024-05-22 NOTE — Telephone Encounter (Signed)
Routing as an FYI

## 2024-05-22 NOTE — Progress Notes (Signed)
 Last cardiology note:  [facility]  [facility] Outside Information Office Visit 05/14/2023 [facility] Charlotte [name] Pam - 69 y.o. Female; [birth year] Encounter Summary , generated on Jan. 23, 2025January 23, 2025  Progress Notes - documented in this encounter  Florencio Cara Endow, MD - 05/14/2023 8:30 AM EST Formatting of this note is different from the original. Established Patient Visit  Chief Complaint: Chief Complaint Patient presents with 6 month follow up Date of Service: 05/14/2023 Date of Birth: 07-Apr-1956 PCP: Onita Pulling, MD History of Present Illness: Charlotte [name] is a 69 y.o.female patient who gives a history of recently found to have a pituitary tumor treated at [facility] surgically he feels better now patient also states that her mother is sick and [facility] reportedly may have had cancer and pulmonary embolus the patient herself states that she is doing reasonably well except that she is having chest pain symptoms shortness of breath recent onset midsternal mostly worse with exertion but somewhat worse  Past Medical and Surgical History Past Medical History Past Medical History: Diagnosis Date Acute pancreatitis (HHS-HCC) Allergic rhinitis 11/05/2014 Back pain Chronic bronchitis (CMS/HHS-HCC) 11/05/2014 Depression Diabetic peripheral neuropathy Esophageal reflux Fatty infiltration of liver 07/23/2015 Fatty infiltration of liver 07/2015 GERD (gastroesophageal reflux disease) History of motion sickness Hypertension Iron deficiency anemia (GWK) 09/30/2013 Obesity Osteoarthritis Rheumatoid Arthritis Sinusitis, unspecified Sleep apnea not severe enough for CPAP Torn rotator cuff left shoulder Type 2 diabetes mellitus (CMS/HHS-HCC) 2008 Vitamin D  deficiency  Past Surgical History She has a past surgical history that includes Cholecystectomy; Spine surgery; Uterine fibroid removed; Tubal pregnancy  with right oophorectomy; Right rotator cuff repair; Bilateral foot surgery; colonoscopy (07/14/2005); colonoscopy (10/14/2010); egd (10/14/2010); Hysterectomy; Colonoscopy (08/18/2015); egd (08/18/2015); incision tendon sheath for trigger finger (Left, 11/24/2016); esophagogastrodoudenoscopy w/biopsy (N/A, 12/26/2016); Colonoscopy (07/16/2020); and EGD @ [facility] (01/17/2022).  Medications and Allergies Current Medications  Current Outpatient Medications Medication Sig Dispense Refill allopurinoL  (ZYLOPRIM ) 100 MG tablet Take 2 tablets (200 mg total) by mouth once daily 60 tablet 5 amLODIPine  (NORVASC ) 10 MG tablet Take 1 tablet by mouth once daily for blood pressure 90 tablet 2 aspirin  81 MG chewable tablet Take 81 mg by mouth every morning.  blood-glucose meter Misc 1 each by XX route as directed 1 each 1 carvediloL  (COREG ) 6.25 MG tablet Take 1 tablet (6.25 mg total) by mouth 2 (two) times daily with meals 180 tablet 0 diclofenac (VOLTAREN) 1 % topical gel flash glucose scanning (FREESTYLE LIBRE 2 READER) reader Use 1 Device as directed 1 each 1 flash glucose sensor (FREESTYLE LIBRE 2 SENSOR) kit Use 1 kit every 14 (fourteen) days for 336 days for glucose monitoring 6 kit 3 gabapentin  (NEURONTIN ) 100 MG capsule Take 1 capsule (100 mg total) by mouth at bedtime 30 capsule 5 HYDROcodone -acetaminophen  (NORCO) 7.5-325 mg tablet Take 1 tablet by mouth every 6 (six) hours as needed insulin  LISPRO (ADMELOG, HUMALOG) injection (concentration 100 units/mL) Inject subcutaneously 3 (three) times daily with meals 30 units leflunomide (ARAVA) 20 MG tablet Take 1 tablet (20 mg total) by mouth once daily 30 tablet 4 lidocaine  (LIDODERM ) 5 % patch APPLY 1 TO 2 PATCHES TOPICALLY TO CLEAN, DRY SKIN. LEAVE ON FOR 12 HOURS THEN REMOVE. MUST WAIT AT LEAST 12 HOURS BEFORE APPLYING PATCH(ES) AGAIN. losartan -hydroCHLOROthiazide  (HYZAAR) 100-25 mg tablet Take 1 tablet by mouth once daily 30 tablet 0 metFORMIN   (GLUCOPHAGE -XR) 500 MG XR tablet TAKE 2 TABLETS BY MOUTH ONCE DAILY WITH FOOD 180 tablet  3 naloxone (NARCAN) 4 mg/actuation nasal spray omeprazole  (PRILOSEC) 20 MG DR capsule TAKE 1 CAPSULE BY MOUTH EVERY DAY 30 MINUTES TO 1 HOUR BEFORE A MEAL ondansetron  (ZOFRAN -ODT) 4 MG disintegrating tablet DISSOLVE 1 TABLET IN MOUTH EVERY 6 HOURS AS NEEDED FOR NAUSEA FOR VOMITING polyethylene glycol (MIRALAX ) packet Take 17 g by mouth as needed Mix in 4-8ounces of fluid prior to taking. potassium chloride  (KLOR-CON  M20) 20 MEQ ER tablet Take 1 tablet (20 mEq total) by mouth once daily 30 tablet 11 spironolactone  (ALDACTONE ) 25 MG tablet Take 1 tablet by mouth once daily 90 tablet 0 topiramate (TOPAMAX) 25 MG tablet Take 1 tablet (25 mg total) by mouth once daily 90 tablet 3 valACYclovir (VALTREX) 1000 MG tablet Take 1 tablet (1,000 mg total) by mouth 2 (two) times daily 60 tablet 11 atorvastatin  (LIPITOR) 80 MG tablet Take 1 tablet (80 mg total) by mouth once daily (Patient not taking: Reported on 05/14/2023) 90 tablet 3 blood glucose diagnostic test strip 1 each (1 strip total) 3 (three) times daily Use as instructed. (Patient not taking: Reported on 11/02/2022) 300 each 3 blood glucose meter kit as directed 1 each 0 dapagliflozin propanediol (FARXIGA) 10 mg tablet Take 1 tablet (10 mg total) by mouth once daily (Patient not taking: Reported on 05/14/2023) ezetimibe  (ZETIA ) 10 mg tablet Take 1 tablet (10 mg total) by mouth once daily 30 tablet 11 insulin  GLARGINE (LANTUS SOLOSTAR U-100 INSULIN ) pen injector (concentration 100 units/mL) Inject 60 Units subcutaneously once daily AND 45 Units at bedtime. 99 mL 3 lancets Use 1 each 4 (four) times daily Use as instructed. (Patient not taking: Reported on 02/28/2023) 100 each 12 ONETOUCH ULTRAMINI kit Use to check blood sugars three times a day (Patient not taking: Reported on 11/02/2022) 1 each 0 pen needle, diabetic 31 gauge x 3/16 needle Use 5 (five) times daily  (Patient not taking: Reported on 11/02/2022) 400 each 3 sertraline  (ZOLOFT ) 25 MG tablet Take 1 tablet (25 mg total) by mouth once daily 90 tablet 3  No current facility-administered medications for this visit.  Allergies: Lisinopril-hydrochlorothiazide , Ace inhibitors, Januvia [sitagliptin], Liraglutide, Lyrica [pregabalin], and Sulfa (sulfonamide antibiotics)  Social and Family History Social History reports that she quit smoking about 18 years ago. Her smoking use included cigarettes. She started smoking about 51 years ago. She has a 33 pack-year smoking history. She has been exposed to tobacco smoke. She has never used smokeless tobacco. She reports that she does not currently use alcohol. She reports that she does not use drugs.  Family History Family History Problem Relation Name Age of Onset Diabetes Father Kidney disease Father Diabetes type II Father Diabetes Brother Arthritis Mother No Known Problems Sister No Known Problems Son Stroke Brother Anesthesia problems Neg Hx Malignant hypertension Neg Hx  Review of Systems  Review of Systems: The patient denies chest pain, shortness of breath, orthopnea, paroxysmal nocturnal dyspnea, pedal edema, palpitations, heart racing, presyncope, syncope. Review of 12 Systems is negative except as described above.  Physical Examination  Vitals:BP 130/70  Pulse 83  Resp 16  Ht 167.6 cm (5' 6)  Wt 98 kg (216 lb)  LMP (LMP Unknown)  SpO2 96%  BMI 34.86 kg/m Ht:167.6 cm (5' 6) Wt:98 kg (216 lb) ADJ:Anib surface area is 2.14 meters squared. Body mass index is 34.86 kg/m.  HEENT: Pupils equally reactive to light and accomodation Neck: Supple without thyromegaly, carotid pulses 2+ Lungs: clear to auscultation bilaterally; no wheezes, rales, rhonchi Heart: Regular rate and rhythm. No  gallops, murmurs or rub Abdomen: soft nontender, nondistended, with normal bowel sounds Extremities: no cyanosis, clubbing, or edema Peripheral  Pulses: 2+ in all extremities, 2+ femoral pulses bilaterally Neurologic: Alert and oriented X3; speech intact; face symmetrical; moves all extremities well  Assessment  69 y.o. female with 1. Chest pain, unspecified type 2. SOB (shortness of breath) 3. Primary hypertension 4. Obesity, Class I, BMI 30-34.9 5. Mixed hyperlipidemia 6. Abnormality of heart beat 7. Former tobacco use 8. Diabetes mellitus type 2, insulin  dependent (CMS/HHS-HCC) 9. OSA (obstructive sleep apnea) 10. Angina pectoris, variant (CMS-HCC) 11. Palpitations  Plan Chest pain possible angina recommend further evaluation cardiac CTA with FFR Shortness of breath echocardiogram for further assessment of left ventricular function valvular structures Obesity recommend weight loss exercise portion control Hypertension reasonably managed amlodipine  carvedilol  losartan  HCTZ spironolactone  Diabetes type 2 uncomplicated currently on metformin  Farxiga Humalog Lantus Previous history of smoking continue advised patient refrain from tobacco abuse COPD advised patient refrain from smoking continue inhalers follow-up with pulmonary Obstructive sleep apnea by history recommend sleep study continue CPAP weight loss Hyperlipidemia patient currently managed on Lipitor therapy for lipid management Pituitary tumor being treated at [facility] continue current management Have the patient follow-up in 6 months  Return in about 6 months (around 11/12/2023).  DWAYNE D CALLWOOD, MD  This dictation was prepared with dragon dictation. Any transcription errors that result from this process are unintentional.  Electronically signed by Florencio Cara Endow, MD at 05/23/2023 11:54 AM EST

## 2024-05-22 NOTE — Progress Notes (Signed)
 CT CORONARY MORPH W/CTA COR W/SCORE W/CA W/CM &/OR WO/CM Order: [id number] Status: Edited Result - FINAL   Details  Reading Physician Reading Date Result Priority  Darliss Rogue, MD [phone] 06/21/2023   Marlee Andrea NOVAK, MD [phone]  07/05/2023    Addenda ADDENDUM REPORT: 07/05/2023 17:26   EXAM: OVER-READ INTERPRETATION  CT CHEST   The following report is an over-read performed by radiologist Dr. Andrea Marlee of [facility] on 07/05/2023. This over-read does not include interpretation of cardiac or coronary anatomy or pathology. The coronary CTA interpretation by the cardiologist is attached.   COMPARISON:  CT 11/20/2022   FINDINGS: Vascular: No aortic atherosclerosis. The included aorta is normal in caliber.   Mediastinum/nodes: No adenopathy or mass. Unremarkable esophagus.   Lungs: The right upper lobe nodule on prior exam is not included in the field of view, question nodular density seen in this region on scout. No pleural fluid. The included airways are patent.   Upper abdomen: No acute findings.  Suspected hepatic steatosis   Musculoskeletal: There are no acute or suspicious osseous abnormalities. Thoracic spondylosis.   IMPRESSION: 1. The right upper lobe nodule on prior exam is not included in the field of view, question nodular density in this region on scout. If not performed elsewhere, full field of view chest CT is recommended for follow-up of previous nodule. 2. Suspected hepatic steatosis.     Electronically Signed   By: Andrea Marlee M.D.   On: 07/05/2023 17:26   Signed by Marlee Andrea NOVAK, MD on 07/05/2023 17:28 Narrative & Impression CLINICAL DATA:  Chest pain, shortness of breath   EXAM: Cardiac/Coronary  CTA   TECHNIQUE: The patient was scanned on a Siemens Somatom go.Top scanner.   : A retrospective scan was triggered in the ascending thoracic aorta. Axial non-contrast 3 mm slices were carried out  through the heart. The data set was analyzed on a dedicated work station and scored using the Agatson method. Gantry rotation speed was 330 msecs and collimation was .6 mm. 100mg  of metoprolol  and 0.8 mg of sl NTG was given. The 3D data set was reconstructed in 5% intervals of the 60-95 % of the R-R cycle. Diastolic phases were analyzed on a dedicated work station using MPR, MIP and VRT modes. The patient received 100 cc of contrast.   FINDINGS: Aorta:  Normal size.  No calcifications.  No dissection.   Aortic Valve:  Trileaflet. Minimal calcifications.   Coronary Arteries:  Normal coronary origin.  Right dominance.   RCA is a dominant artery. There is no plaque.   Left main gives rise to LAD and LCX arteries. LM has no disease.   LAD has no plaque.   LCX is a non-dominant artery.  There is no plaque.   Other findings:   Normal pulmonary vein drainage into the left atrium.   Normal left atrial appendage without a thrombus.   Normal size of the pulmonary artery.   IMPRESSION: 1. Coronary calcium  score of 0.   2. Normal coronary origin with right dominance.   3. No evidence of CAD.   4. CAD-RADS 0. Consider non-atherosclerotic causes of chest pain.   Electronically Signed: By: Rogue Darliss M.D. On: 06/21/2023 14:20       Exam Ended: 06/21/23 12:32 Last Resulted: 07/05/23 17:26     Result Care Coordination
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
## 2024-05-23 ENCOUNTER — Ambulatory Visit
Admission: RE | Admit: 2024-05-23 | Discharge: 2024-05-23 | Disposition: A | Source: Ambulatory Visit | Attending: Pulmonary Disease | Admitting: Pulmonary Disease

## 2024-05-23 DIAGNOSIS — R911 Solitary pulmonary nodule: Secondary | ICD-10-CM | POA: Diagnosis present

## 2024-05-26 ENCOUNTER — Other Ambulatory Visit: Payer: Self-pay

## 2024-05-26 ENCOUNTER — Ambulatory Visit: Admitting: Certified Registered Nurse Anesthetist

## 2024-05-26 ENCOUNTER — Ambulatory Visit

## 2024-05-26 ENCOUNTER — Ambulatory Visit
Admission: RE | Admit: 2024-05-26 | Discharge: 2024-05-26 | Disposition: A | Discharge status: Home / Self Care | Attending: Pulmonary Disease | Admitting: Pulmonary Disease

## 2024-05-26 ENCOUNTER — Encounter: Payer: Self-pay | Admitting: Pulmonary Disease

## 2024-05-26 ENCOUNTER — Encounter
Admission: RE | Disposition: A | Discharge status: Home / Self Care | Payer: Self-pay | Source: Home / Self Care | Attending: Pulmonary Disease

## 2024-05-26 DIAGNOSIS — Z87891 Personal history of nicotine dependence: Secondary | ICD-10-CM | POA: Insufficient documentation

## 2024-05-26 DIAGNOSIS — M069 Rheumatoid arthritis, unspecified: Secondary | ICD-10-CM | POA: Diagnosis not present

## 2024-05-26 DIAGNOSIS — I5030 Unspecified diastolic (congestive) heart failure: Secondary | ICD-10-CM | POA: Insufficient documentation

## 2024-05-26 DIAGNOSIS — E041 Nontoxic single thyroid nodule: Secondary | ICD-10-CM | POA: Diagnosis not present

## 2024-05-26 DIAGNOSIS — R911 Solitary pulmonary nodule: Secondary | ICD-10-CM | POA: Diagnosis present

## 2024-05-26 DIAGNOSIS — K219 Gastro-esophageal reflux disease without esophagitis: Secondary | ICD-10-CM | POA: Insufficient documentation

## 2024-05-26 DIAGNOSIS — I110 Hypertensive heart disease with heart failure: Secondary | ICD-10-CM | POA: Insufficient documentation

## 2024-05-26 DIAGNOSIS — G4733 Obstructive sleep apnea (adult) (pediatric): Secondary | ICD-10-CM | POA: Insufficient documentation

## 2024-05-26 DIAGNOSIS — E1142 Type 2 diabetes mellitus with diabetic polyneuropathy: Secondary | ICD-10-CM | POA: Insufficient documentation

## 2024-05-26 DIAGNOSIS — J449 Chronic obstructive pulmonary disease, unspecified: Secondary | ICD-10-CM | POA: Insufficient documentation

## 2024-05-26 DIAGNOSIS — Z01812 Encounter for preprocedural laboratory examination: Secondary | ICD-10-CM

## 2024-05-26 DIAGNOSIS — Z79899 Other long term (current) drug therapy: Secondary | ICD-10-CM | POA: Diagnosis not present

## 2024-05-26 DIAGNOSIS — K5730 Diverticulosis of large intestine without perforation or abscess without bleeding: Secondary | ICD-10-CM | POA: Diagnosis not present

## 2024-05-26 DIAGNOSIS — Z794 Long term (current) use of insulin: Secondary | ICD-10-CM

## 2024-05-26 HISTORY — PX: VIDEO BRONCHOSCOPY WITH ENDOBRONCHIAL NAVIGATION: SHX6175

## 2024-05-26 HISTORY — PX: ENDOBRONCHIAL ULTRASOUND: SHX5096

## 2024-05-26 LAB — GLUCOSE, CAPILLARY
Glucose-Capillary: 170 mg/dL — ABNORMAL HIGH (ref 70–99)
Glucose-Capillary: 162 mg/dL — ABNORMAL HIGH (ref 70–99)

## 2024-05-26 MED ORDER — SODIUM CHLORIDE 0.9 % IV SOLN: INTRAVENOUS | Status: DC

## 2024-05-26 MED ORDER — IPRATROPIUM-ALBUTEROL 0.5-2.5 (3) MG/3ML IN SOLN
3.0000 mL | Freq: Once | RESPIRATORY_TRACT | Status: AC
  Administered 2024-05-26: 3 mL via RESPIRATORY_TRACT

## 2024-05-26 MED ORDER — GLYCOPYRROLATE 0.2 MG/ML IJ SOLN
INTRAMUSCULAR | Status: DC | PRN
  Administered 2024-05-26: .2 mg via INTRAVENOUS

## 2024-05-26 MED ORDER — LIDOCAINE HCL (CARDIAC) PF 100 MG/5ML IV SOSY
PREFILLED_SYRINGE | INTRAVENOUS | Status: DC | PRN
  Administered 2024-05-26: 100 mg via INTRAVENOUS

## 2024-05-26 MED ORDER — ONDANSETRON HCL 4 MG/2ML IJ SOLN
INTRAMUSCULAR | Status: AC
  Filled 2024-05-26: qty 2

## 2024-05-26 MED ORDER — PHENYLEPHRINE 80 MCG/ML (10ML) SYRINGE FOR IV PUSH (FOR BLOOD PRESSURE SUPPORT)
PREFILLED_SYRINGE | INTRAVENOUS | Status: AC
  Filled 2024-05-26: qty 10

## 2024-05-26 MED ORDER — CHLORHEXIDINE GLUCONATE 0.12 % MT SOLN
OROMUCOSAL | Status: AC
  Filled 2024-05-26: qty 15

## 2024-05-26 MED ORDER — CHLORHEXIDINE GLUCONATE 0.12 % MT SOLN
15.0000 mL | Freq: Once | OROMUCOSAL | Status: AC
  Administered 2024-05-26: 15 mL via OROMUCOSAL

## 2024-05-26 MED ORDER — PROPOFOL 1000 MG/100ML IV EMUL
INTRAVENOUS | Status: AC
  Filled 2024-05-26: qty 100

## 2024-05-26 MED ORDER — DROPERIDOL 2.5 MG/ML IJ SOLN: 0.6250 mg | Freq: Once | INTRAMUSCULAR | Status: DC | PRN

## 2024-05-26 MED ORDER — ACETAMINOPHEN 10 MG/ML IV SOLN: 1000.0000 mg | Freq: Once | INTRAVENOUS | Status: DC | PRN

## 2024-05-26 MED ORDER — DEXAMETHASONE SOD PHOSPHATE PF 10 MG/ML IJ SOLN
INTRAMUSCULAR | Status: AC
  Filled 2024-05-26: qty 1

## 2024-05-26 MED ORDER — PROPOFOL 10 MG/ML IV BOLUS
INTRAVENOUS | Status: DC | PRN
  Administered 2024-05-26: 150 mg via INTRAVENOUS

## 2024-05-26 MED ORDER — MIDAZOLAM HCL 2 MG/2ML IJ SOLN
INTRAMUSCULAR | Status: AC
  Filled 2024-05-26: qty 2

## 2024-05-26 MED ORDER — FENTANYL CITRATE (PF) 100 MCG/2ML IJ SOLN: 25.0000 ug | INTRAMUSCULAR | Status: DC | PRN

## 2024-05-26 MED ORDER — LIDOCAINE HCL (PF) 2 % IJ SOLN
INTRAMUSCULAR | Status: AC
  Filled 2024-05-26: qty 5

## 2024-05-26 MED ORDER — IPRATROPIUM-ALBUTEROL 0.5-2.5 (3) MG/3ML IN SOLN
RESPIRATORY_TRACT | Status: AC
  Filled 2024-05-26: qty 3

## 2024-05-26 MED ORDER — SUGAMMADEX SODIUM 200 MG/2ML IV SOLN
INTRAVENOUS | Status: DC | PRN
  Administered 2024-05-26: 200 mg via INTRAVENOUS

## 2024-05-26 MED ORDER — PROPOFOL 500 MG/50ML IV EMUL
INTRAVENOUS | Status: DC | PRN
  Administered 2024-05-26: 150 ug/kg/min via INTRAVENOUS

## 2024-05-26 MED ORDER — OXYCODONE HCL 5 MG PO TABS: 5.0000 mg | ORAL_TABLET | Freq: Once | ORAL | Status: DC | PRN

## 2024-05-26 MED ORDER — FENTANYL CITRATE (PF) 100 MCG/2ML IJ SOLN
INTRAMUSCULAR | Status: DC | PRN
  Administered 2024-05-26 (×2): 50 ug via INTRAVENOUS

## 2024-05-26 MED ORDER — DEXAMETHASONE SOD PHOSPHATE PF 10 MG/ML IJ SOLN
INTRAMUSCULAR | Status: DC | PRN
  Administered 2024-05-26: 10 mg via INTRAVENOUS

## 2024-05-26 MED ORDER — ORAL CARE MOUTH RINSE: 15.0000 mL | Freq: Once | OROMUCOSAL | Status: AC

## 2024-05-26 MED ORDER — OXYCODONE HCL 5 MG/5ML PO SOLN: 5.0000 mg | Freq: Once | ORAL | Status: DC | PRN

## 2024-05-26 MED ORDER — ONDANSETRON HCL 4 MG/2ML IJ SOLN
INTRAMUSCULAR | Status: DC | PRN
  Administered 2024-05-26: 4 mg via INTRAVENOUS

## 2024-05-26 MED ORDER — FENTANYL CITRATE (PF) 100 MCG/2ML IJ SOLN
INTRAMUSCULAR | Status: AC
  Filled 2024-05-26: qty 2

## 2024-05-26 MED ORDER — ROCURONIUM BROMIDE 100 MG/10ML IV SOLN
INTRAVENOUS | Status: DC | PRN
  Administered 2024-05-26: 20 mg via INTRAVENOUS
  Administered 2024-05-26: 60 mg via INTRAVENOUS

## 2024-05-26 MED ORDER — PHENYLEPHRINE 80 MCG/ML (10ML) SYRINGE FOR IV PUSH (FOR BLOOD PRESSURE SUPPORT)
PREFILLED_SYRINGE | INTRAVENOUS | Status: DC | PRN
  Administered 2024-05-26 (×4): 80 ug via INTRAVENOUS

## 2024-05-26 MED ORDER — MIDAZOLAM HCL (PF) 2 MG/2ML IJ SOLN
INTRAMUSCULAR | Status: DC | PRN
  Administered 2024-05-26: 2 mg via INTRAVENOUS

## 2024-05-26 NOTE — Procedures (Signed)
 Date of Procedure: 05/26/2024   Pre-op Diagnosis: Right upper lobe lung nodule, rule out carcinoma  Post-op Diagnosis: Same as above   Surgeon: KYM Leita Sanders, MD  Assistant: Greig Perry, RRT  Endo tech: Asberry Boehringer  Fluoroscopy technician: Waddell Gains, RT   Anesthesia: General endotracheal anesthesia, see anesthesia record   Operation: Bronchoscopy with robotic assistance and biopsies.   Estimated Blood Loss: Minimal   Complications: None  Procedure Note:  Clinical indication Evaluation of a newly detected peripheral pulmonary nodule in the XXX lobe, identified on a recent follow-up FDG-PET/CT.   Technique Intraoperative Cone Beam Computed Tomography (CBCT) images were acquired using a GE Healthcare OEC 3D system, optimized for thoracic imaging. A high-quality 30-second acquisition protocol was used. The patient was positioned supine on the examination table. Pre-biopsy CBCT scans were performed to confirm the lesion's visibility and to determine the optimal approach. Navigation was performed using an [employer] (Intuitive). Repeat CBCT scans were performed to confirm tool-in-lesion placement and to assess for complications.   Findings Right Upper Lobe Peripheral Pulmonary Nodule: A solid nodule, measuring approximately 15 mm in diameter, is identified in the right upper lobe. The nodule exhibits a solid/subsolid appearance. Bronchial Anatomy: Patent airways are visualized up to the segmental bronchi leading towards the target nodule. Navigation: The Ion bronchoscopic navigation successfully acquired the target and guided biopsy tools towards the right upper lobe nodule. Tool-in-Lesion Confirmation: Intraoperative CBCT confirmed the accurate positioning of the biopsy forceps within the nodule. Complications: No immediate complications such as pneumothorax or hemorrhage were observed during or immediately following the procedure.   Total fluoroscopy  time/dose  Total time: 3 minutes 3 seconds Total dose 242.59 mGy  Impression CBCT-guided navigation bronchoscopy with biopsy was performed for the peripheral pulmonary nodule in the right upper lobe. The procedure was technically successful with confirmed tool-in-lesion placement. Samples were obtained for histological and cytological examination.   Recommendations Submit biopsy samples for histopathological and cytological analysis. Images were uploaded to PACS. Schedule follow-up appointment with the referring physician to discuss the results and treatment plan. Monitor for delayed complications such as pneumothorax or hemorrhage.   Please see associated operative report for robotic bronchoscopy description.  KYM Leita Sanders, MD Advanced Bronchoscopy PCCM [facility]    *This note was generated using voice recognition software/Dragon and/or AI transcription program.  Despite best efforts to proofread, errors can occur which can change the meaning. Any transcriptional errors that result from this process are unintentional and may not be fully corrected at the time of dictation.

## 2024-05-26 NOTE — Anesthesia Postprocedure Evaluation (Signed)
"   Anesthesia Post Note  Patient: Charlotte [name]  Procedure(s) Performed: VIDEO BRONCHOSCOPY WITH ENDOBRONCHIAL NAVIGATION (Right) ENDOBRONCHIAL ULTRASOUND (EBUS) (Right)  Patient location during evaluation: PACU Anesthesia Type: General Level of consciousness: awake and alert Pain management: pain level controlled Vital Signs Assessment: post-procedure vital signs reviewed and stable Respiratory status: spontaneous breathing, nonlabored ventilation and respiratory function stable Cardiovascular status: blood pressure returned to baseline and stable Postop Assessment: no apparent nausea or vomiting Anesthetic complications: no   No notable events documented.   Last Vitals:  Vitals:   05/26/24 1030 05/26/24 1044  BP: 129/82 108/76  Pulse: 68 78  Resp: 11 15  Temp: (!) 36.1 C (!) 36.4 C  SpO2: 98% 98%    Last Pain:  Vitals:   05/26/24 1044  TempSrc: Temporal  PainSc: 0-No pain                 [name]      "

## 2024-05-26 NOTE — Anesthesia Procedure Notes (Addendum)
 Procedure Name: Intubation Date/Time: 05/26/2024 8:11 AM  Performed by: Belinda, Virgil Lightner, CRNAPre-anesthesia Checklist: Patient identified, Emergency Drugs available, Suction available and Patient being monitored Patient Re-evaluated:Patient Re-evaluated prior to induction Oxygen Delivery Method: Circle system utilized Preoxygenation: Pre-oxygenation with 100% oxygen Induction Type: IV induction Ventilation: Mask ventilation without difficulty Laryngoscope Size: McGrath and 3 Grade View: Grade I Tube type: Oral Tube size: 8.5 mm Number of attempts: 1 Airway Equipment and Method: Stylet Placement Confirmation: ETT inserted through vocal cords under direct vision, positive ETCO2 and breath sounds checked- equal and bilateral Secured at: 21 cm Tube secured with: Tape Dental Injury: Teeth and Oropharynx as per pre-operative assessment

## 2024-05-26 NOTE — Transfer of Care (Signed)
 Immediate Anesthesia Transfer of Care Note  Patient: Charlotte [name]  Procedure(s) Performed: VIDEO BRONCHOSCOPY WITH ENDOBRONCHIAL NAVIGATION (Right) ENDOBRONCHIAL ULTRASOUND (EBUS) (Right)  Patient Location: PACU  Anesthesia Type:General  Level of Consciousness: drowsy  Airway & Oxygen Therapy: Patient Spontanous Breathing and Patient connected to face mask oxygen  Post-op Assessment: Report given to RN and Post -op Vital signs reviewed and stable  Post vital signs: Reviewed and stable  Last Vitals:  Vitals Value Taken Time  BP 140/79 05/26/24 09:45  Temp    Pulse 78 05/26/24 09:51  Resp 16 05/26/24 09:51  SpO2 100 % 05/26/24 09:51  Vitals shown include unfiled device data.  Last Pain:  Vitals:   05/26/24 0619  TempSrc: Temporal  PainSc: 5          Complications: No notable events documented.

## 2024-05-26 NOTE — Interval H&P Note (Signed)
 Charlotte [name] has presented today for surgery, with the diagnosis of RIGHT upper lobe lung nodule/mass.  The various methods of treatment have been discussed with the patient and family. After consideration of risks, benefits and other options for treatment, the patient has consented to  Procedure(s): ROBOTIC ASSISTED NAVIGATIONAL BRONCHOSCOPY WITH ENDOBRONCHIAL ULTRASOUND AND BIOPSIES AS NEEDED-right as a surgical intervention.  The patient's history has been reviewed, patient examined, no change in status, stable for surgery.  I have reviewed the patient's chart and labs.  Questions were answered to the patient's satisfaction.  Benefits, limitations and potential complications of the procedure were discussed with the patient/family.  Complications from bronchoscopy are rare and most often minor, but if they occur they may include breathing difficulty, vocal cord spasm, hoarseness, slight fever, vomiting, dizziness, bronchospasm, infection, low blood oxygen, bleeding from biopsy site, or an allergic reaction to medications.  It is uncommon for patients to experience other more serious complications for example: Collapsed lung requiring chest tube placement, respiratory failure, heart attack and/or cardiac arrhythmia.  Patient has agreed to proceed.  KYM Leita Sanders, MD Advanced Bronchoscopy PCCM [facility]    *This note was generated using voice recognition software/Dragon and/or AI transcription program.  Despite best efforts to proofread, errors can occur which can change the meaning. Any transcriptional errors that result from this process are unintentional and may not be fully corrected at the time of dictation.

## 2024-05-26 NOTE — Op Note (Signed)
 Video Bronchoscopy with Robotic Assisted Bronchoscopic Navigation   Date of Operation: 05/26/2024   Pre-op Diagnosis: Right upper lobe nodule 15 mm in diameter, rule out carcinoma  Post-op Diagnosis: Same as above  Surgeon: KYM Leita Sanders, MD  Assistant: Greig Perry, RRT  Endo tech: Asberry Boehringer  Industry representative: Not applicable  Anesthesia: General endotracheal anesthesia  Operation: Flexible video bronchoscopy with robotic assistance and biopsies.  Estimated Blood Loss: Minimal  Complications: None  Indications and History: Charlotte [name] is a 69 y.o. female with history of right upper lung nodule noted in 2021 however increasing in diameter now 15 mm.  Recommendation made to achieve a tissue diagnosis via robotic assisted navigational bronchoscopy.  The risks, benefits, complications, treatment options and expected outcomes were discussed with the patient.  The possibilities of pneumothorax, pneumonia, reaction to medication, pulmonary aspiration, perforation of a viscus, bleeding, failure to diagnose a condition and creating a complication requiring transfusion or operation were discussed with the patient who freely signed the consent.    Description of Procedure: The patient was seen in the Preoperative Area, was examined and was deemed appropriate to proceed.  The patient was taken to [facility] Procedure Room 2 (Bronchoscopy Suite), identified as Charlotte [name] and the procedure verified as Robotic Assisted Video Bronchoscopy.  A Time Out was held and the above information confirmed.   Prior to the date of the procedure a high-resolution CT scan of the chest was performed. Utilizing ION software program a virtual tracheobronchial tree was generated to allow the creation of distinct navigation pathways to the patient's parenchymal abnormalities. After being taken to the operating room general anesthesia was initiated and the patient  was orally intubated. The Olympus video  bronchoscope was introduced via the endotracheal tube and a general inspection was performed which showed normal right and left lung anatomy. Aspiration of the bilateral mainstems was completed to remove any remaining secretions. Robotic catheter was then inserted into patient's endotracheal tube.   Target #1 RUL: The distinct navigation pathways prepared prior to this procedure were then utilized to navigate to patient's lesion identified on CT scan. The robotic catheter was secured into place and the vision probe was withdrawn.  Lesion location was approximated using fluoroscopy.  Local registration and targeting was performed using GE Healthcare OEC 3D system mobile C-arm three-dimensional imaging. Under fluoroscopic guidance transbronchial needle biopsies, and transbronchial forceps biopsies were performed to be sent for cytology and pathology.  Tool-in-lesion was confirmed using [employer] OEC 3D system mobile C-arm.  REBUS was utilized to further confirm target acquisition.  A targeted bronchioalveolar lavage was performed in the RUL and sent for cytology.   Additional biopsy, LEFT mainstem bronchus: During video bronchoscopic examination a polypoid lesion was noted on the LEFT mainstem bronchus.  After the robotic procedure was concluded the robotic catheter was removed and the Olympus video bronchoscope was then reintroduced into the airway and the polypoid lesion was biopsied with ConMed Precisor forceps.  Both were collected in formalin for histologic examination.  At the end of the procedure a general airway inspection was performed and there was no evidence of active bleeding.  Patient received 9 mL of 1% lidocaine  via bronchial lavage and the bronchoscope was removed.  The patient tolerated the procedure well. There was no significant blood loss and there were no obvious complications. A post-procedural chest x-ray showed no pneumothorax.  Intraoperative images: Mucosal abnormality/lesion  LEFT mainstem bronchus:   Bronchoscopic catheter at target:   REBUS image  consistent with target acquisition:   Tool in lesion      Samples Target  RUL: 1. Transbronchial needle biopsies from RUL: X 4 2. Transbronchial forceps biopsies from RUL: X 6 3. Bronchoalveolar lavage from RUL: 5 mL aliquot   Samples left main: Endobronchial biopsies from LEFT MAIN: X 3   Impression: Right upper lobe lung nodule 15 mm diameter, rule out carcinoma, status post technically successful robotic assisted navigational bronchoscopy Incidental polypoid lesion left main bronchus, status post endobronchial biopsy   Plan:  We will review the cytology and pathology results with the patient when they become available.  Outpatient followup will be with pulmonary medicine.    KYM Leita Sanders, MD Advanced Bronchoscopy PCCM [facility]    *This note was generated using voice recognition software/Dragon and/or AI transcription program.  Despite best efforts to proofread, errors can occur which can change the meaning. Any transcriptional errors that result from this process are unintentional and may not be fully corrected at the time of dictation.

## 2024-05-26 NOTE — Anesthesia Preprocedure Evaluation (Addendum)
 "                                  Anesthesia Evaluation  Patient identified by MRN, date of birth, ID band Patient awake    Reviewed: Allergy & Precautions, H&P , NPO status , Patient's Chart, lab work & pertinent test results, reviewed documented beta blocker date and time   Airway Mallampati: III  TM Distance: >3 FB Neck ROM: full    Dental  (+) Edentulous Upper   Pulmonary sleep apnea , COPD, former smoker Lung nodule  CT angio 12/25:CT Angio Chest PE W and/or Wo Contrast Result Date: 04/27/2024 IMPRESSION: 1. No pulmonary embolism through the proximal subsegmental pulmonary arteries. 2. RIGHT upper lobe irregular pulmonary nodule measures 15 x 11 x 12 mm, previously 10 x 9 x 8 mm in 2021. This is concerning for a slow growing neoplasm. Recommend thoracic surgery consultation if not previously performed. Outpatient PET/CT should be considered. These recommendations are taken from: Recommendations for the Management of Subsolid Pulmonary Nodules Detected at CT: A Statement from the Fleischner Society Radiology 2013; 266:1, 304-317. 3. Possible 22 mm isthmus thyroid  nodule. Recommend nonemergent thyroid  US  (ref: J Am Coll Radiol. 2015 Feb;12(2): 143-50). 4. Enlarged main pulmonary artery in relation to the ascending thoracic aorta. This can be seen in the setting of pulmonary arterial hypertension. 5. Background of mosaic attenuation, which can be seen in the setting of small airways disease. Electronically Signed   By: Corean Salter M.D.   On: 04/27/2024 13:51    Pulmonary exam normal        Cardiovascular Exercise Tolerance: Poor hypertension, On Medications +CHF  Normal cardiovascular exam+ Valvular Problems/Murmurs  Rhythm:regular Rate:Normal  CT coronary 2/25: IMPRESSION: 1. Coronary calcium  score of 0.   2. Normal coronary origin with right dominance.   3. No evidence of CAD.   4. CAD-RADS 0. Consider non-atherosclerotic causes of chest pain.       Neuro/Psych  PSYCHIATRIC DISORDERS  Depression     Neuromuscular disease    GI/Hepatic Neg liver ROS, PUD,GERD  Medicated,,  Endo/Other  diabetes    Renal/GU negative Renal ROS  negative genitourinary   Musculoskeletal   Abdominal  (+) + obese  Peds  Hematology  (+) Blood dyscrasia, anemia   Anesthesia Other Findings Diastolic heart failure- echo and EKG reviewed. Pt without acute symptoms today  Chart review for ED admission 12/25 indicated SOB with slightly elevated BNP concerning for acute HF or COPD exacerbation. She was treated with steroids and diuresis with quick resolution of symptoms.   Past Medical History: 2025: Acute idiopathic gout of left hand 2016: Allergic rhinitis 2016: DDD (degenerative disc disease), cervical No date: DDD (degenerative disc disease), lumbar No date: Depression No date: Diabetes mellitus without complication (HCC) 2016: Diabetic polyneuropathy associated with type 2 diabetes  mellitus (HCC) No date: Diverticulitis No date: Fatty liver No date: Fibroid tumor No date: GERD (gastroesophageal reflux disease) No date: Gout No date: H pylori ulcer 12/19/2023: Heart murmur No date: Hemorrhoids 2017: Hepatic steatosis No date: Hypercholesteremia 2016: Hyperlipidemia, unspecified No date: Hypertension No date: IBS (irritable bowel syndrome) No date: IDA (iron deficiency anemia) 2018: Impingement syndrome of left shoulder 2024: Lateral epicondylitis, left elbow 05/20/2024: Lung nodule 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) No date: OA (osteoarthritis) No date: Obesity 2025: Postherpetic neuralgia No date: RA (rheumatoid arthritis) (HCC) No date: Sleep apnea 2021: Solitary pulmonary nodule No  date: Spinal stenosis 07/10/2023: Trochanteric bursitis of left hip 2016: Varicose veins of bilateral lower extremities with pain No date: Vitamin D  deficiency Past Surgical History: No date: ABDOMINAL HYSTERECTOMY     Comment:   partial No date: BACK SURGERY No date: CHOLECYSTECTOMY 08/18/2015: COLONOSCOPY WITH PROPOFOL ; N/A     Comment:  Procedure: COLONOSCOPY WITH PROPOFOL ;  Surgeon: Lamar ONEIDA Holmes, MD;  Location: [facility] ENDOSCOPY;  Service:               Endoscopy;  Laterality: N/A; 07/16/2020: COLONOSCOPY WITH PROPOFOL ; N/A     Comment:  Procedure: COLONOSCOPY WITH PROPOFOL ;  Surgeon:               Maryruth Ole ONEIDA, MD;  Location: [facility] ENDOSCOPY;                Service: Endoscopy;  Laterality: N/A; No date: ECTOPIC PREGNANCY SURGERY 08/18/2015: ESOPHAGOGASTRODUODENOSCOPY (EGD) WITH PROPOFOL ; N/A     Comment:  Procedure: ESOPHAGOGASTRODUODENOSCOPY (EGD) WITH               PROPOFOL ;  Surgeon: Lamar ONEIDA Holmes, MD;  Location: [facility]              ENDOSCOPY;  Service: Endoscopy;  Laterality: N/A; 01/17/2022: ESOPHAGOGASTRODUODENOSCOPY (EGD) WITH PROPOFOL ; N/A     Comment:  Procedure: ESOPHAGOGASTRODUODENOSCOPY (EGD) WITH               PROPOFOL ;  Surgeon: Maryruth Ole ONEIDA, MD;  Location:               [facility] ENDOSCOPY;  Service: Endoscopy;  Laterality: N/A;                DM No date: FOOT SURGERY No date: incision tendon sheath for trigger finger No date: NECK SURGERY No date: ROTATOR CUFF REPAIR; Right No date: SHOULDER SURGERY; Right No date: SPINE SURGERY     Comment:  neck x2 and lumbar spine x1 No date: uterine fibroid removed BMI    Body Mass Index: 35.01 kg/m     Reproductive/Obstetrics negative OB ROS                              Anesthesia Physical Anesthesia Plan  ASA: 3  Anesthesia Plan: General ETT   Post-op Pain Management: Minimal or no pain anticipated   Induction: Intravenous  PONV Risk Score and Plan: 4 or greater and Dexamethasone  and Ondansetron   Airway Management Planned: Oral ETT  Additional Equipment:   Intra-op Plan:   Post-operative Plan: Extubation in OR  Informed Consent: I have reviewed the patients History and Physical, chart,  labs and discussed the procedure including the risks, benefits and alternatives for the proposed anesthesia with the patient or authorized representative who has indicated his/her understanding and acceptance.     Dental Advisory Given  Plan Discussed with: CRNA  Anesthesia Plan Comments:          Anesthesia Quick Evaluation  "

## 2024-05-26 NOTE — Discharge Instructions (Signed)
 Video Bronchoscopy, Care After (includes Robotic and EBUS procedures) This sheet gives you information about how to care for yourself after your test. Your doctor may also give you more specific instructions. If you have problems or questions, contact your doctor. Follow these instructions at home: Eating and drinking When you are wide awake, your numbness is gone and your cough and gag reflexes have come back, you may: Start eating only soft foods. Slowly drink liquids. Six hours after the test, go back to your normal diet. Driving Do not drive for 24 hours if you were given a medicine to help you relax (sedative). Do not drive or use heavy machinery while taking prescription pain medicine. General instructions Take over-the-counter and prescription medicines only as told by your doctor. Return to your normal activities as told. Ask what activities are safe for you. Do not use any products that have nicotine or tobacco in them. This includes cigarettes and e-cigarettes. If you need help quitting, ask your doctor. Keep all follow-up visits as told by your doctor. This is important. It is very important if you had a tissue sample (biopsy) taken. Get help right away if: You have shortness of breath that gets worse. You get light-headed. You feel like you are going to pass out (faint). You have chest pain. You cough up: More than a little blood (i.e more than a tablespoon). More blood than before. Summary Do not use cigarettes. Do not use e-cigarettes. Seek care in the Emergency Department right away if you have chest pain or shortness of breath. Call or MyChart Message our office for any questions or problems at [phone].    This information is not intended to replace advice given to you by your health care provider. Make sure you discuss any questions you have with your health care provider.

## 2024-05-27 ENCOUNTER — Ambulatory Visit: Admitting: Family Medicine

## 2024-05-27 ENCOUNTER — Encounter: Payer: Self-pay | Admitting: Pulmonary Disease

## 2024-05-27 LAB — CYTOLOGY - NON PAP

## 2024-05-27 LAB — SURGICAL PATHOLOGY

## 2024-05-28 ENCOUNTER — Ambulatory Visit: Payer: Self-pay | Admitting: Pulmonary Disease

## 2024-05-28 DIAGNOSIS — R911 Solitary pulmonary nodule: Secondary | ICD-10-CM

## 2024-05-28 MED ORDER — AZITHROMYCIN 250 MG PO TABS: ORAL_TABLET | ORAL | 0 refills | Status: AC

## 2024-05-28 MED ORDER — PREDNISONE 20 MG PO TABS: 20.0000 mg | ORAL_TABLET | Freq: Every day | ORAL | 0 refills | Status: AC

## 2024-06-09 ENCOUNTER — Inpatient Hospital Stay: Admitting: Family Medicine

## 2024-06-10 ENCOUNTER — Ambulatory Visit: Admitting: Pulmonary Disease

## 2024-06-10 ENCOUNTER — Other Ambulatory Visit
Admission: RE | Admit: 2024-06-10 | Discharge: 2024-06-10 | Disposition: A | Discharge status: Home / Self Care | Attending: Pulmonary Disease | Admitting: Pulmonary Disease

## 2024-06-10 ENCOUNTER — Encounter: Payer: Self-pay | Admitting: Pulmonary Disease

## 2024-06-10 VITALS — BP 126/76 | HR 73 | Temp 97.8°F | Ht 66.0 in | Wt 215.8 lb

## 2024-06-10 DIAGNOSIS — K117 Disturbances of salivary secretion: Secondary | ICD-10-CM | POA: Diagnosis present

## 2024-06-10 DIAGNOSIS — R911 Solitary pulmonary nodule: Secondary | ICD-10-CM | POA: Diagnosis present

## 2024-06-10 DIAGNOSIS — R590 Localized enlarged lymph nodes: Secondary | ICD-10-CM | POA: Insufficient documentation

## 2024-06-10 DIAGNOSIS — R052 Subacute cough: Secondary | ICD-10-CM | POA: Insufficient documentation

## 2024-06-10 DIAGNOSIS — Z87891 Personal history of nicotine dependence: Secondary | ICD-10-CM | POA: Diagnosis not present

## 2024-06-10 LAB — CBC WITH DIFFERENTIAL/PLATELET
Abs Immature Granulocytes: 0.03 10*3/uL (ref 0.00–0.07)
Basophils Absolute: 0 10*3/uL (ref 0.0–0.1)
Basophils Relative: 0 %
Eosinophils Absolute: 0.1 10*3/uL (ref 0.0–0.5)
Eosinophils Relative: 1 %
HCT: 37.9 % (ref 36.0–46.0)
Hemoglobin: 12.3 g/dL (ref 12.0–15.0)
Immature Granulocytes: 0 %
Lymphocytes Relative: 40 %
Lymphs Abs: 3 10*3/uL (ref 0.7–4.0)
MCH: 27.6 pg (ref 26.0–34.0)
MCHC: 32.5 g/dL (ref 30.0–36.0)
MCV: 85 fL (ref 80.0–100.0)
Monocytes Absolute: 0.5 10*3/uL (ref 0.1–1.0)
Monocytes Relative: 6 %
Neutro Abs: 3.8 10*3/uL (ref 1.7–7.7)
Neutrophils Relative %: 53 %
Platelets: 341 10*3/uL (ref 150–400)
RBC: 4.46 MIL/uL (ref 3.87–5.11)
RDW: 13.1 % (ref 11.5–15.5)
WBC: 7.4 10*3/uL (ref 4.0–10.5)
nRBC: 0 % (ref 0.0–0.2)

## 2024-06-10 LAB — NITRIC OXIDE: Nitric Oxide: 9

## 2024-06-10 MED ORDER — PREDNISONE 20 MG PO TABS: 20.0000 mg | ORAL_TABLET | Freq: Every day | ORAL | 0 refills | Status: AC

## 2024-06-10 MED ORDER — AZITHROMYCIN 250 MG PO TABS: ORAL_TABLET | ORAL | 0 refills | Status: AC

## 2024-06-10 NOTE — Patient Instructions (Signed)
 VISIT SUMMARY:  During your visit, we discussed the results of your recent bronchoscopy and addressed your ongoing symptoms. We reviewed your history of diabetes and past COVID-19 infections, and we discussed your concerns about the possibility of cancer. We also planned follow-up imaging and tests to monitor your condition.  YOUR PLAN:  -PULMONARY NODULE WITH NODULAR LYMPHOID HYPERPLASIA: This condition involves small bumps in the lung with a significant presence of lymphocytes, indicating inflammation. We have prescribed prednisone  to reduce inflammation and antibiotics to address any potential infection. We will also conduct blood tests to check for lupus and sarcoidosis, and you are scheduled for a follow-up CT scan in early April. Please monitor for any worsening symptoms and report them to us .  -SUBACUTE COUGH: This is a persistent cough likely due to inflammation in your airway, causing thick mucus that is difficult to clear. We have prescribed prednisone  to help reduce the inflammation. Please continue to monitor your symptoms and let us  know if they worsen.  INSTRUCTIONS:  Please follow up with the scheduled CT scan in early April and the MRI in May. Additionally, complete the blood tests as ordered. If you experience any worsening symptoms, contact our office immediately.

## 2024-06-10 NOTE — Progress Notes (Signed)
 "  Subjective:    Patient ID: Charlotte [name], female    DOB: [birth year], 69 y.o.   MRN: [id number]  Patient Care Team: Ziglar, Susan K, MD as PCP - General (Family Medicine)  Chief Complaint  Patient presents with   Lung Lesion    Cough with yellow phlegm. Shortness of breath on exertion and wheezing.     BACKGROUND/INTERVAL:Patient is a 69 year old former smoker (16 PY) and a history as noted below, who presents for follow-up of a right upper lobe nodule noted on CT chest 27 April 2024.  Nodule was initially noted on a CT of March 2021.  HPI Discussed the use of AI scribe software for clinical note transcription with the patient, who gave verbal consent to proceed.  History of Present Illness   Charlotte [name] is a 69 year old female who presents for follow-up after a bronchoscopy.  She underwent a bronchoscopy which revealed small bumps in the left main bronchus. A biopsy indicated lymphocytic aggregate.  She experiences a persistent 'thickness' of secretion in her throat, which she describes as requiring her to 'almost gag' to bring up sputum due to its thickness.  She has a history of diabetes and coordinates with her diabetes doctor to adjust her insulin  regimen when prescribed prednisone , based on her blood sugar levels.  She has experienced COVID-19 twice in the past, though it is unclear if this is related to her current symptoms.  Her last CT scan was in January, and we will schedule for a follow-up CT in early April. Additionally, she is scheduled for an MRI in May due to a growth on her brain.  Does have issues with dry mouth.     DATA/EVENTS: 03/0/2021 CT chest: 10 mm part solid right upper lobe pulmonary nodule with irregular margins and mild adjacent ground glass attenuation.  Enlarged pulmonary trunk.  Slightly nodular configuration of the left adrenal gland unchanged from prior CT of 2018 08/29/2022 PFTs:FEV1 was 1.96, 83% of predicted,FVC was 2.46 liters, 82% of  predicted, FEV1/FVC 78.7 percent, lung volumes normal with mild air trapping noted, diffusion capacity normal.  Overall normal. 11/20/2022 CT chest: 15 mm density in the right upper lobe, this was not compared to prior study.  1.3 cm nodule in the left adrenal. 04/27/2024 CT angio chest: 15 x 11 x 12 mm nodule on the right upper lobe with mixed ground glass and solid component.  Increased from 2021 film but not from 2024 film. 05/26/2024 robotic assisted bronchoscopy: RUL nodule nondiagnostic, incidental left main bronchus nodule showed lymphoid aggregate.   Review of Systems A 10 point review of systems was performed and it is as noted above otherwise negative.   Patient Active Problem List   Diagnosis Date Noted   Lung nodule 05/20/2024   CHF (congestive heart failure) (HCC) 05/10/2024   Postherpetic neuralgia 04/12/2024   Cardiac murmur, unspecified 12/19/2023   Neck pain 12/19/2023   Trochanteric bursitis of left hip 07/10/2023   Acute idiopathic gout of left hand 06/07/2023   Full thickness rotator cuff tear 06/06/2023   Non-functioning pituitary adenoma (HCC) 02/08/2023   Lateral epicondylitis, left elbow 12/08/2022   Type 2 diabetes mellitus with hyperglycemia, with long-term current use of insulin  (HCC) 08/10/2021   Solitary pulmonary nodule 10/23/2019   Encounter for screening mammogram for malignant neoplasm of breast 07/25/2019   Impingement syndrome of left shoulder 07/18/2016   Hepatic steatosis 07/23/2015   Moderate episode of recurrent major depressive disorder (HCC) 06/03/2015  B12 deficiency 11/05/2014   Gastro-esophageal reflux disease without esophagitis 11/05/2014   Obesity (BMI 30-39.9) 11/05/2014   Vitamin D  deficiency 11/05/2014   Essential (primary) hypertension 11/05/2014   Diverticulosis of large intestine without perforation or abscess without bleeding 11/05/2014   Varicose veins of bilateral lower extremities with pain 11/05/2014   Allergic rhinitis  11/05/2014   Hyperlipidemia, unspecified 11/05/2014   DDD (degenerative disc disease), lumbosacral 09/18/2014   DDD (degenerative disc disease), cervical 09/18/2014   Diabetic polyneuropathy associated with type 2 diabetes mellitus (HCC) 09/18/2014   Rheumatoid arthritis of multiple sites without rheumatoid factor (HCC) 09/30/2013    Social History   Tobacco Use   Smoking status: Former    Current packs/day: 0.00    Average packs/day: 0.5 packs/day for 32.4 years (16.2 ttl pk-yrs)    Types: Cigarettes    Start date: 05/15/1972    Quit date: 09/20/2004    Years since quitting: 19.7    Passive exposure: Current   Smokeless tobacco: Never  Substance Use Topics   Alcohol use: No    Alcohol/week: 0.0 standard drinks of alcohol    Allergies[1]  Active Medications[2]  Immunization History  Administered Date(s) Administered   Hep A / Hep B 07/28/2015, 09/21/2015, 09/28/2023   INFLUENZA, HIGH DOSE SEASONAL PF 02/06/2023, 03/12/2024   Influenza, Seasonal, Injecte, Preservative Fre 01/24/2010, 02/08/2012   Influenza,inj,Quad PF,6+ Mos 01/16/2013, 04/17/2014, 03/04/2015, 03/21/2016, 04/09/2017, 06/11/2018, 01/17/2019, 02/23/2020, 02/07/2021   Influenza-Unspecified 04/17/2014, 03/04/2015, 04/09/2017, 06/11/2018, 01/17/2019, 02/07/2022   PFIZER Comirnaty(Gray Top)Covid-19 Tri-Sucrose Vaccine 08/06/2019, 08/27/2019, 01/12/2020   PNEUMOCOCCAL CONJUGATE-20 02/07/2022   Pneumococcal Conjugate-13 12/21/2009, 08/06/2014   Pneumococcal Polysaccharide-23 12/21/2009   Td 12/21/2009   Tdap 12/21/2009, 02/20/2022   Zoster Recombinant(Shingrix) 07/09/2018, 01/17/2019, 02/06/2023   Zoster, Live 08/06/2014        Objective:     Vitals:   06/10/24 1019  BP: 126/76  Pulse: 73  Temp: 97.8 F (36.6 C)  Height: 5' 6 (1.676 m)  Weight: 215 lb 12.8 oz (97.9 kg)  SpO2: 98%  TempSrc: Temporal  BMI (Calculated): 34.85     SpO2: 98 %  GENERAL: Obese woman, no acute distress, fully ambulatory,  no conversational dyspnea.   HEAD: Normocephalic, atraumatic.  EYES: Pupils equal, round, reactive to light.  No scleral icterus.  MOUTH: Nose/mouth/throat not examined due to institutional masking requirements.SABRA NECK: Supple. No thyromegaly. Trachea midline. No JVD.  No adenopathy. PULMONARY: Good air entry bilaterally.  Coarse, otherwise, no adventitious sounds. CARDIOVASCULAR: S1 and S2. Regular rate and rhythm.  No rubs, murmurs or gallops heard. ABDOMEN: Obese, otherwise benign. MUSCULOSKELETAL: No joint deformity, no clubbing, no edema.  NEUROLOGIC: No overt focal deficit, no gait disturbance, speech is fluent. SKIN: Intact,warm,dry. PSYCH: Mood and behavior normal.  Lab Results  Component Value Date   NITRICOXIDE 9 06/10/2024  This result suggests low (<25) Type 2 (T2) airway inflammation indicating a low likelihood of active T2-driven airway inflammation; reduced probability of response to inhaled corticosteroids.     Reviewed all biopsy data with the patient, reviewed also full report from her bronchoscopy and showed the patient images.  Assessment & Plan:     ICD-10-CM   1. Lung nodule  R91.1 CT CHEST WO CONTRAST    Angiotensin converting enzyme    ANA    Sjogrens syndrome-A extractable nuclear antibody    Sjogrens syndrome-B extractable nuclear antibody    2. Subacute cough  R05.2 Nitric oxide    CBC with Differential/Platelet    Angiotensin converting enzyme  3. Nodular lymphoid hyperplasia of lung  R59.0 Angiotensin converting enzyme   Under investigation    4. Xerostomia  K11.7 ANA    Sjogrens syndrome-A extractable nuclear antibody    Sjogrens syndrome-B extractable nuclear antibody      Orders Placed This Encounter  Procedures   CT CHEST WO CONTRAST    Standing Status:   Future    Expected Date:   08/22/2024    Expiration Date:   06/10/2025    Scheduling Instructions:     Lung nodule 3 month f/u around 10 April.    Preferred imaging location?:    [facility]   CBC with Differential/Platelet    Standing Status:   Future    Expected Date:   06/10/2024    Expiration Date:   06/10/2025   Angiotensin converting enzyme    Standing Status:   Future    Expiration Date:   06/10/2025   ANA    Standing Status:   Future    Expiration Date:   06/10/2025   Sjogrens syndrome-A extractable nuclear antibody    Standing Status:   Future    Expiration Date:   06/10/2025   Sjogrens syndrome-B extractable nuclear antibody    Standing Status:   Future    Expiration Date:   06/10/2025   Nitric oxide    Meds ordered this encounter  Medications   predniSONE  (DELTASONE ) 20 MG tablet    Sig: Take 1 tablet (20 mg total) by mouth daily with breakfast for 5 days.    Dispense:  5 tablet    Refill:  0   azithromycin  (ZITHROMAX ) 250 MG tablet    Sig: Take 2 tablets (500 mg) on  Day 1,  followed by 1 tablet (250 mg) once daily on Days 2 through 5.    Dispense:  6 each    Refill:  0   Discussion:    Pulmonary nodule with nodular lymphoid hyperplasia Identified on the left side during bronchoscopy. Biopsy revealed lymphocytic aggregates, suggesting inflammation. Differential diagnosis includes connective tissue disease and sarcoidosis. No evidence of malignancy on the right side, but monitoring is necessary due to potential for inflammation to mask underlying pathology. - Prescribed prednisone  for inflammation. - Prescribed antibiotics. - Ordered blood tests to check for connective tissue disease and sarcoidosis. - Scheduled follow-up CT scan in early April. - Monitor for any worsening symptoms and adjust follow-up as needed.  Subacute cough Likely related to inflammation in the airway. Symptoms include thick mucus that requires gagging to clear, suggesting a different type of inflammation compared to typical sinus-related issues. - Checked level of inflammation in the airway. - Prescribed prednisone  to reduce inflammation.     Follow-up after CT  scan performed in April  Advised if symptoms do not improve or worsen, to please contact office for sooner follow up or seek emergency care.    I spent 40 minutes of dedicated to the care of this patient on the date of this encounter to include pre-visit review of records, face-to-face time with the patient discussing conditions above, post visit ordering of testing, clinical documentation with the electronic health record, making appropriate referrals as documented, and communicating necessary findings to members of the patients care team.     C. Leita Sanders, MD Advanced Bronchoscopy [facility]    *This note was generated using voice recognition software/Dragon and/or AI transcription program.  Despite best efforts to proofread, errors can occur which can change the meaning. Any transcriptional errors that  result from this process are unintentional and may not be fully corrected at the time of dictation.     [1]  Allergies Allergen Reactions   Lisinopril-Hydrochlorothiazide  Shortness Of Breath   Liraglutide Other (See Comments)    H/o pancreatitis   Pregabalin Swelling    Hand swelling   Sitagliptin Other (See Comments)    Hx of pancreatitis   Sulfadiazine Hives   Ace Inhibitors Rash and Other (See Comments)    Other reaction(s): Unknown  Pt can't remember what her reaction is and she can't remember what the name of the BP med  Other reaction(s): Unknown  Pt can't remember what her reaction is and she can't remember what the name of the BP med   Other reaction(s): Unknown  Other reaction(s): Unknown  Pt can't remember what her reaction is and she can't remember what the name of the BP med   Other reaction(s): Unknown  Pt can't remember what her reaction is and she can't remember what the name of the BP med   Other reaction(s): Unknown  Other reaction(s): Unknown  Pt can't remember what her reaction is and she can't remember what the name of the  BP med   Other reaction(s): Unknown  Pt can't remember what her reaction is and she can't remember what the name of the BP med   Other reaction(s): Unknown, Pt can't remember what her reaction is and she can't remember what the name of the BP med , Other reaction(s): Unknown, Other reaction(s): Unknown, Pt can't remember what her reaction is and she can't remember what the name of the BP med , Other reaction(s): Unknown, Pt can't remember what her reaction is and she can't remember what the name of the BP med   Sulfa Antibiotics Other (See Comments), Rash and Dermatitis  [2]  Current Meds  Medication Sig   albuterol  (VENTOLIN  HFA) 108 (90 Base) MCG/ACT inhaler Inhale 2 puffs into the lungs every 6 (six) hours as needed for wheezing or shortness of breath.   allopurinol  (ZYLOPRIM ) 300 MG tablet Take 300 mg by mouth daily.   amLODipine  (NORVASC ) 10 MG tablet TAKE 1 TABLET(10 MG TOTAL) BY MOUTH DAILY.   aspirin  81 MG chewable tablet Chew by mouth daily.   atorvastatin  (LIPITOR) 80 MG tablet Take 1 tablet (80 mg total) by mouth daily.   azithromycin  (ZITHROMAX ) 250 MG tablet Take 2 tablets (500 mg) on  Day 1,  followed by 1 tablet (250 mg) once daily on Days 2 through 5.   Blood Glucose Monitoring Suppl (ONE TOUCH ULTRA MINI) w/Device KIT Use to check blood sugars three times a day   carvedilol  (COREG ) 6.25 MG tablet Take 1 tablet (6.25 mg total) by mouth 2 (two) times daily with a meal.   cetirizine  (ZYRTEC ) 10 MG tablet Take 1 tablet (10 mg total) by mouth daily.   Continuous Glucose Receiver (FREESTYLE LIBRE 2 READER) DEVI Place 1 each onto the skin See admin instructions.   Continuous Glucose Sensor (FREESTYLE LIBRE 2 SENSOR) MISC Place 1 each onto the skin every 14 (fourteen) days.   diazepam  (VALIUM ) 5 MG tablet Take 1 tablet (5 mg total) by mouth every 12 (twelve) hours as needed for anxiety.   diclofenac Sodium (VOLTAREN) 1 % GEL Apply topically 4 (four) times daily.   ezetimibe  (ZETIA ) 10  MG tablet Take 1 tablet (10 mg total) by mouth daily.   fluticasone  (FLONASE ) 50 MCG/ACT nasal spray Place 2 sprays into both nostrils as needed for  allergies.   furosemide  (LASIX ) 20 MG tablet If weight is 218 or higher, Take one po q day until weight back to 216 or lower.   gabapentin  (NEURONTIN ) 300 MG capsule Take 1 capsule (300 mg total) by mouth 4 (four) times daily. (Patient taking differently: Take 300 mg by mouth 3 (three) times daily.)   glucose blood (PRECISION QID TEST) test strip Use as instructed   glucose blood (TRUE METRIX BLOOD GLUCOSE TEST) test strip 1 each by Other route in the morning, at noon, and at bedtime. Use as instructed   HYDROcodone -acetaminophen  (NORCO) 7.5-325 MG tablet Take 1 tablet by mouth every 6 (six) hours as needed.   hydroxypropyl methylcellulose / hypromellose (ISOPTO TEARS / GONIOVISC) 2.5 % ophthalmic solution Place 1 drop into the right eye every hour as needed for dry eyes.   Insulin  Glargine (BASAGLAR KWIKPEN) 100 UNIT/ML Inject 68 units AM and 75 units PM   insulin  lispro (HUMALOG) 100 UNIT/ML KwikPen Take 30 units with meals plus correction as instructed. Max TDD 150 units.   KLOR-CON  M20 20 MEQ tablet TAKE 1 BY MOUTH ONCE DAILY   leflunomide (ARAVA) 20 MG tablet Take 20 mg by mouth daily.   losartan -hydrochlorothiazide  (HYZAAR) 100-25 MG tablet Take 1 tablet by mouth daily.   meclizine  (ANTIVERT ) 25 MG tablet Take 1 tablet (25 mg total) by mouth 3 (three) times daily as needed for dizziness.   metFORMIN  (GLUCOPHAGE -XR) 500 MG 24 hr tablet Take 2 tablets (1,000 mg total) by mouth 2 (two) times daily with a meal.   naloxone (NARCAN) nasal spray 4 mg/0.1 mL    Omega-3 Fatty Acids (FISH OIL) 300 MG CAPS    omeprazole  (PRILOSEC) 40 MG capsule Take 1 capsule (40 mg total) by mouth daily.   ondansetron  (ZOFRAN -ODT) 4 MG disintegrating tablet Take 1 tablet (4 mg total) by mouth every 6 (six) hours as needed for nausea or vomiting.   polyethylene glycol  (MIRALAX ) packet Take 17 g by mouth daily.   prednisoLONE acetate (PRED FORTE) 1 % ophthalmic suspension Place 1 drop into the right eye 2 (two) times daily.   predniSONE  (DELTASONE ) 20 MG tablet Take 1 tablet (20 mg total) by mouth daily with breakfast for 5 days.   senna-docusate (SENOKOT-S) 8.6-50 MG tablet Take 1 tablet by mouth 2 (two) times daily. (Patient taking differently: Take 1 tablet by mouth as needed.)   sertraline  (ZOLOFT ) 25 MG tablet Take 1 tablet by mouth daily.   spironolactone  (ALDACTONE ) 25 MG tablet Take 1 tablet by mouth once daily   sucralfate  (CARAFATE ) 1 G tablet Take 1 tablet by mouth 4 (four) times daily. Reported on 11/11/2015   valACYclovir (VALTREX) 1000 MG tablet Take 1,000 mg by mouth 2 (two) times daily.   "

## 2024-06-11 ENCOUNTER — Ambulatory Visit: Payer: Self-pay | Admitting: Pulmonary Disease

## 2024-06-11 LAB — ANA: Anti Nuclear Antibody (ANA): NEGATIVE

## 2024-06-11 LAB — SJOGRENS SYNDROME-B EXTRACTABLE NUCLEAR ANTIBODY: SSB (La) (ENA) Antibody, IgG: <0.2 AI (ref 0.0–0.9)

## 2024-06-11 LAB — ANGIOTENSIN CONVERTING ENZYME: Angiotensin-Converting Enzyme: 43 U/L (ref 14–82)

## 2024-06-11 LAB — SJOGRENS SYNDROME-A EXTRACTABLE NUCLEAR ANTIBODY: SSA (Ro) (ENA) Antibody, IgG: <0.2 AI (ref 0.0–0.9)

## 2024-06-13 ENCOUNTER — Other Ambulatory Visit (INDEPENDENT_AMBULATORY_CARE_PROVIDER_SITE_OTHER): Payer: Self-pay | Admitting: Nurse Practitioner

## 2024-06-13 DIAGNOSIS — I83811 Varicose veins of right lower extremities with pain: Secondary | ICD-10-CM

## 2024-06-16 ENCOUNTER — Other Ambulatory Visit (INDEPENDENT_AMBULATORY_CARE_PROVIDER_SITE_OTHER): Payer: Self-pay

## 2024-06-16 ENCOUNTER — Ambulatory Visit (INDEPENDENT_AMBULATORY_CARE_PROVIDER_SITE_OTHER): Payer: Self-pay | Admitting: Nurse Practitioner

## 2024-06-16 ENCOUNTER — Encounter (INDEPENDENT_AMBULATORY_CARE_PROVIDER_SITE_OTHER): Payer: Self-pay | Admitting: Nurse Practitioner

## 2024-06-16 VITALS — BP 118/76 | HR 80 | Resp 17 | Ht 66.0 in | Wt 217.4 lb

## 2024-06-16 DIAGNOSIS — I83811 Varicose veins of right lower extremities with pain: Secondary | ICD-10-CM

## 2024-06-16 DIAGNOSIS — I10 Essential (primary) hypertension: Secondary | ICD-10-CM | POA: Diagnosis not present

## 2024-06-16 DIAGNOSIS — I83813 Varicose veins of bilateral lower extremities with pain: Secondary | ICD-10-CM

## 2024-06-16 DIAGNOSIS — E1165 Type 2 diabetes mellitus with hyperglycemia: Secondary | ICD-10-CM | POA: Diagnosis not present

## 2024-06-16 DIAGNOSIS — Z794 Long term (current) use of insulin: Secondary | ICD-10-CM | POA: Diagnosis not present

## 2024-06-19 ENCOUNTER — Encounter: Payer: Self-pay | Admitting: Family Medicine

## 2024-06-19 ENCOUNTER — Ambulatory Visit (INDEPENDENT_AMBULATORY_CARE_PROVIDER_SITE_OTHER): Admitting: Family Medicine

## 2024-06-19 VITALS — BP 109/69 | HR 75 | Temp 98.6°F | Resp 16 | Ht 66.0 in | Wt 217.8 lb

## 2024-06-19 DIAGNOSIS — R911 Solitary pulmonary nodule: Secondary | ICD-10-CM

## 2024-06-19 DIAGNOSIS — I10 Essential (primary) hypertension: Secondary | ICD-10-CM

## 2024-06-19 DIAGNOSIS — M109 Gout, unspecified: Secondary | ICD-10-CM | POA: Diagnosis not present

## 2024-06-19 DIAGNOSIS — L723 Sebaceous cyst: Secondary | ICD-10-CM | POA: Insufficient documentation

## 2024-06-19 DIAGNOSIS — E118 Type 2 diabetes mellitus with unspecified complications: Secondary | ICD-10-CM

## 2024-06-19 MED ORDER — PREDNISONE 10 MG (48) PO TBPK: ORAL_TABLET | Freq: Every day | ORAL | 0 refills | Status: AC

## 2024-06-19 NOTE — Assessment & Plan Note (Signed)
 Taking allopurinol  200mg  daily but has taken Lasix  recently and eaten fish.  Prednsione dose-pack for 12 days.  Continue the allopurinol .

## 2024-06-19 NOTE — Assessment & Plan Note (Signed)
 Recent bronchoscopy revealed no cancer.  Has two nodules.

## 2024-06-19 NOTE — Assessment & Plan Note (Signed)
 Small, about 1 cm.  Hard and tender.  Refer to dermatology for removal.

## 2024-06-19 NOTE — Assessment & Plan Note (Signed)
 Over treated.  Coreg  6.25mg  BID, spironolactone  25mg , losartan -hydrochlorothiazide  100-25, amlodipine  10mg  daily.  Stop the amlodipine .  FOLLOW-UP in 3 months

## 2024-06-19 NOTE — Progress Notes (Signed)
 "  Established Patient Office Visit  Subjective   Patient ID: Charlotte [name], female    DOB: [birth year]  Age: 69 y.o. MRN: [id number]  Chief Complaint  Patient presents with   Hospitalization Follow-up    Hospital follow up.    Cyst    Has a cyst on left side of back. Kept putting peroxide on it and got smaller but it is still hard.   Allergic Reaction    Pt ate some white fish Wednesday and fingers have locked. Has ate cat fish and never had an issue with it.     Allergic Reaction   Discussed the use of AI scribe software for clinical note transcription with the patient, who gave verbal consent to proceed.  History of Present Illness   Charlotte [name] is a 69 year old female with lung nodules and gout who presents with swollen hands and a cyst on her back.  She has a history of lung nodules, initially identified on the right side during a bronchoscopy, with another nodule discovered on the left side during the same procedure. Neither nodule was cancerous.  She is scheduled for a follow-up in April. She has been experiencing increased mucus production and was prescribed a Z-Pak, which has helped alleviate the mucus.  She presents with swollen hands, particularly affecting her fingers, which have been 'locked' and throbbing. The swelling extends to her wrists, causing significant pain and tenderness. She attributes a recent flare-up to consuming white fish, although she notes that catfish and shrimp have not previously caused issues. She takes allopurinol  300 mg daily for gout and uses furosemide  as needed when her weight exceeds 216 pounds, which she suspects may contribute to her gout symptoms.  She has a cyst on her back, described as larger than a nickel, hard, and red. She has been applying alcohol and peroxide, which has reduced its size. The cyst occasionally itches and stings. She was previously prescribed a Z-Pak.  She experiences shortness of breath with exertion, such as making  beds or doing dishes, and uses an Albuterol  inhaler as needed, which she has only used twice recently. She quit smoking 16 years ago.   Her blood pressure is consistently low, with a recent reading of 109/60. She takes carvedilol  6.25mg  twice daily, amlodipine  10mg , losartan -hydrochlorothiazide  100-25mg , spironolactone  25mg  daily.     She reports residual numbness in her left neck from a past shingles infection, despite having received the shingles vaccine.     Her coronary calcium  score was zero.  She needs her cholesterol checked.      Objective:     BP 109/69   Pulse 75   Temp 98.6 F (37 C) (Oral)   Resp 16   Ht 5' 6 (1.676 m)   Wt 217 lb 12.8 oz (98.8 kg)   SpO2 95%   BMI 35.15 kg/m    Physical Exam Vitals and nursing note reviewed.  Constitutional:      Appearance: Normal appearance.  HENT:     Head: Normocephalic and atraumatic.  Eyes:     Conjunctiva/sclera: Conjunctivae normal.  Cardiovascular:     Rate and Rhythm: Normal rate and regular rhythm.  Pulmonary:     Effort: Pulmonary effort is normal.     Breath sounds: Normal breath sounds.  Musculoskeletal:     Right lower leg: No edema.     Left lower leg: No edema.     Comments: Hands are swollen across PIP, DIPs and MCPs.  Palms  are swolen as well.  Greatly reduced ROM.  Tender to palpation  Skin:    General: Skin is warm and dry.     Comments: Small sebaceous cyst, 1cm, hard and tender.  No flocculance.    Neurological:     Mental Status: She is alert and oriented to person, place, and time.  Psychiatric:        Mood and Affect: Mood normal.        Behavior: Behavior normal.        Thought Content: Thought content normal.        Judgment: Judgment normal.          No results found for any visits on 06/19/24.    The 10-year ASCVD risk score (Arnett DK, et al., 2019) is: 16.1%    Assessment & Plan:  Sebaceous cyst Assessment & Plan: Small, about 1 cm.  Hard and tender.  Refer to  dermatology for removal.    Orders: -     Ambulatory referral to Dermatology  Acute gout of hand, unspecified cause, unspecified laterality Assessment & Plan: Taking allopurinol  200mg  daily but has taken Lasix  recently and eaten fish.  Prednsione dose-pack for 12 days.  Continue the allopurinol .    Orders: -     predniSONE ; Take by mouth daily. 12-day taper pack, use as directed for taper  Dispense: 48 tablet; Refill: 0  Controlled diabetes mellitus type 2 with complications (HCC) -     Lipid panel; Future -     CMP14+EGFR  Lung nodule Assessment & Plan: Recent bronchoscopy revealed no cancer.  Has two nodules.     Primary hypertension Assessment & Plan: Over treated.  Coreg  6.25mg  BID, spironolactone  25mg , losartan -hydrochlorothiazide  100-25, amlodipine  10mg  daily.  Stop the amlodipine .  FOLLOW-UP in 3 months      Return in about 3 months (around 09/16/2024).    Kingdom Vanzanten K Mychal Decarlo, MD "

## 2024-06-20 ENCOUNTER — Ambulatory Visit: Payer: Self-pay | Admitting: Family Medicine

## 2024-06-20 ENCOUNTER — Encounter: Payer: Self-pay | Admitting: Family Medicine

## 2024-06-20 DIAGNOSIS — K219 Gastro-esophageal reflux disease without esophagitis: Secondary | ICD-10-CM

## 2024-06-20 DIAGNOSIS — I152 Hypertension secondary to endocrine disorders: Secondary | ICD-10-CM

## 2024-06-20 LAB — CMP14+EGFR
ALT: 8 [IU]/L (ref 0–32)
AST: 11 [IU]/L (ref 0–40)
Albumin: 4.3 g/dL (ref 3.9–4.9)
Alkaline Phosphatase: 87 [IU]/L (ref 49–135)
BUN/Creatinine Ratio: 13 (ref 12–28)
BUN: 14 mg/dL (ref 8–27)
Bilirubin Total: 0.3 mg/dL (ref 0.0–1.2)
CO2: 24 mmol/L (ref 20–29)
Calcium: 9.7 mg/dL (ref 8.7–10.3)
Chloride: 99 mmol/L (ref 96–106)
Creatinine, Ser: 1.05 mg/dL — ABNORMAL HIGH (ref 0.57–1.00)
Globulin, Total: 2.3 g/dL (ref 1.5–4.5)
Glucose: 194 mg/dL — ABNORMAL HIGH (ref 70–99)
Potassium: 3.9 mmol/L (ref 3.5–5.2)
Sodium: 140 mmol/L (ref 134–144)
Total Protein: 6.6 g/dL (ref 6.0–8.5)
eGFR: 58 mL/min/{1.73_m2} — ABNORMAL LOW

## 2024-06-20 MED ORDER — CARVEDILOL 6.25 MG PO TABS: 6.2500 mg | ORAL_TABLET | Freq: Two times a day (BID) | ORAL | 1 refills | Status: AC

## 2024-06-20 MED ORDER — LOSARTAN POTASSIUM-HCTZ 100-25 MG PO TABS: 1.0000 | ORAL_TABLET | Freq: Every day | ORAL | 1 refills | Status: AC

## 2024-06-20 MED ORDER — OMEPRAZOLE 40 MG PO CPDR: 40.0000 mg | DELAYED_RELEASE_CAPSULE | Freq: Every day | ORAL | 1 refills | Status: AC

## 2024-06-20 MED ORDER — ALLOPURINOL 300 MG PO TABS: 300.0000 mg | ORAL_TABLET | Freq: Every day | ORAL | 1 refills | Status: AC

## 2024-09-11 ENCOUNTER — Ambulatory Visit: Admitting: Pulmonary Disease

## 2024-09-19 ENCOUNTER — Ambulatory Visit (INDEPENDENT_AMBULATORY_CARE_PROVIDER_SITE_OTHER): Admitting: Nurse Practitioner

## 2024-09-23 ENCOUNTER — Ambulatory Visit: Admitting: Family Medicine
